# Patient Record
Sex: Male | Born: 1945 | Race: Asian
Health system: Southern US, Academic
[De-identification: ages and names within clinical notes are randomized; demographics above are authoritative.]

## PROBLEM LIST (undated history)

## (undated) ENCOUNTER — Encounter

## (undated) DIAGNOSIS — J189 Pneumonia, unspecified organism: Secondary | ICD-10-CM

## (undated) DIAGNOSIS — D649 Anemia, unspecified: Secondary | ICD-10-CM

## (undated) DIAGNOSIS — J9809 Other diseases of bronchus, not elsewhere classified: Secondary | ICD-10-CM

## (undated) DIAGNOSIS — J42 Unspecified chronic bronchitis: Secondary | ICD-10-CM

## (undated) DIAGNOSIS — E119 Type 2 diabetes mellitus without complications: Secondary | ICD-10-CM

## (undated) DIAGNOSIS — E7800 Pure hypercholesterolemia, unspecified: Secondary | ICD-10-CM

## (undated) DIAGNOSIS — C801 Malignant (primary) neoplasm, unspecified: Secondary | ICD-10-CM

## (undated) DIAGNOSIS — K219 Gastro-esophageal reflux disease without esophagitis: Secondary | ICD-10-CM

## (undated) DIAGNOSIS — K429 Umbilical hernia without obstruction or gangrene: Secondary | ICD-10-CM

## (undated) DIAGNOSIS — M1990 Unspecified osteoarthritis, unspecified site: Secondary | ICD-10-CM

## (undated) DIAGNOSIS — Z87442 Personal history of urinary calculi: Secondary | ICD-10-CM

## (undated) DIAGNOSIS — I10 Essential (primary) hypertension: Secondary | ICD-10-CM

## (undated) DIAGNOSIS — L570 Actinic keratosis: Secondary | ICD-10-CM

## (undated) DIAGNOSIS — G629 Polyneuropathy, unspecified: Secondary | ICD-10-CM

## (undated) HISTORY — PX: JOINT REPLACEMENT: SHX530

## (undated) HISTORY — PX: CARPAL TUNNEL RELEASE: SHX101

## (undated) HISTORY — PX: HERNIA REPAIR: SHX51

## (undated) HISTORY — PX: KNEE ARTHROSCOPY: SUR90

## (undated) HISTORY — PX: FRACTURE SURGERY: SHX138

## (undated) HISTORY — DX: Actinic keratosis: L57.0

## (undated) HISTORY — PX: TONSILLECTOMY: SUR1361

## (undated) HISTORY — PX: EYE SURGERY: SHX253

---

## 2009-09-28 DEATH — deceased

## 2010-09-29 DEATH — deceased

## 2013-03-04 ENCOUNTER — Ambulatory Visit: Payer: Self-pay | Admitting: Orthopedic Surgery

## 2013-04-20 ENCOUNTER — Ambulatory Visit: Payer: Self-pay | Admitting: Orthopedic Surgery

## 2013-04-20 LAB — BASIC METABOLIC PANEL
ANION GAP: 4 — AB (ref 7–16)
BUN: 19 mg/dL — AB (ref 7–18)
CALCIUM: 9.2 mg/dL (ref 8.5–10.1)
CHLORIDE: 102 mmol/L (ref 98–107)
CREATININE: 0.86 mg/dL (ref 0.60–1.30)
Co2: 29 mmol/L (ref 21–32)
EGFR (African American): >60
EGFR (Non-African Amer.): >60
Glucose: 113 mg/dL — ABNORMAL HIGH (ref 65–99)
OSMOLALITY: 273 (ref 275–301)
Potassium: 4 mmol/L (ref 3.5–5.1)
SODIUM: 135 mmol/L — AB (ref 136–145)

## 2013-04-20 LAB — APTT: ACTIVATED PTT: 26.7 s (ref 23.6–35.9)

## 2013-04-20 LAB — CBC
HCT: 44.3 % (ref 40.0–52.0)
HGB: 14.3 g/dL (ref 13.0–18.0)
MCH: 29.9 pg (ref 26.0–34.0)
MCHC: 32.3 g/dL (ref 32.0–36.0)
MCV: 92 fL (ref 80–100)
Platelet: 280 10*3/uL (ref 150–440)
RBC: 4.8 10*6/uL (ref 4.40–5.90)
RDW: 14 % (ref 11.5–14.5)
WBC: 8.7 10*3/uL (ref 3.8–10.6)

## 2013-04-20 LAB — URINALYSIS, COMPLETE
BACTERIA: NONE SEEN
Bilirubin,UR: NEGATIVE
Blood: NEGATIVE
Glucose,UR: NEGATIVE mg/dL (ref 0–75)
Ketone: NEGATIVE
LEUKOCYTE ESTERASE: NEGATIVE
Nitrite: NEGATIVE
PH: 5 (ref 4.5–8.0)
Protein: NEGATIVE
SPECIFIC GRAVITY: 1.023 (ref 1.003–1.030)
SQUAMOUS EPITHELIAL: NONE SEEN

## 2013-04-20 LAB — PROTIME-INR
INR: 0.9
Prothrombin Time: 12.4 secs (ref 11.5–14.7)

## 2013-04-20 LAB — SEDIMENTATION RATE: Erythrocyte Sed Rate: 5 mm/hr (ref 0–20)

## 2013-04-25 ENCOUNTER — Ambulatory Visit: Payer: Self-pay | Admitting: Orthopedic Surgery

## 2013-04-25 LAB — MRSA PCR SCREENING

## 2013-05-03 ENCOUNTER — Inpatient Hospital Stay: Payer: Self-pay | Admitting: Orthopedic Surgery

## 2013-05-04 LAB — BASIC METABOLIC PANEL
Anion Gap: 6 — ABNORMAL LOW (ref 7–16)
BUN: 17 mg/dL (ref 7–18)
CALCIUM: 7.9 mg/dL — AB (ref 8.5–10.1)
Chloride: 101 mmol/L (ref 98–107)
Co2: 25 mmol/L (ref 21–32)
Creatinine: 0.86 mg/dL (ref 0.60–1.30)
EGFR (African American): >60
GLUCOSE: 133 mg/dL — AB (ref 65–99)
OSMOLALITY: 268 (ref 275–301)
POTASSIUM: 4 mmol/L (ref 3.5–5.1)
SODIUM: 132 mmol/L — AB (ref 136–145)

## 2013-05-04 LAB — PLATELET COUNT: Platelet: 163 10*3/uL (ref 150–440)

## 2013-05-04 LAB — HEMOGLOBIN: HGB: 12.3 g/dL — ABNORMAL LOW (ref 13.0–18.0)

## 2013-05-06 LAB — PATHOLOGY REPORT

## 2014-03-14 DIAGNOSIS — E119 Type 2 diabetes mellitus without complications: Secondary | ICD-10-CM | POA: Insufficient documentation

## 2014-03-14 DIAGNOSIS — I10 Essential (primary) hypertension: Secondary | ICD-10-CM | POA: Insufficient documentation

## 2014-07-22 NOTE — Op Note (Signed)
PATIENT NAME:  Todd Farley, Todd Farley MR#:  [id number] DATE OF BIRTH:  [birth year]  DATE OF PROCEDURE:  05/03/2013  PREOPERATIVE DIAGNOSIS: Severe right knee osteoarthritis.   POSTOPERATIVE DIAGNOSIS: Severe right knee osteoarthritis.   PROCEDURE: Right total knee replacement.   SURGEON: Hessie Knows, M.D.   ASSISTANT: Rachelle Hora, PA-C  ANESTHESIA: Spinal.   DESCRIPTION OF PROCEDURE: The patient was brought to the operating room and after adequate spinal anesthesia was obtained the right leg was prepped and draped in the usual sterile fashion. After patient identification and timeout procedures were completed, an anterior skin incision was made incorporating an old knee scar that went distal and medial, further than what we normally use, but for wound healing it was felt this was appropriate. Incision was extended proximally proximal to the patella. Thick flap was created and the anterior capsule then exposed. A medial parapatellar arthrotomy was created and inspection revealed severe tricompartmental osteoarthritis with extensive spurring in the notch and extensive wear of all compartments. There was no normal cartilage visible. The medial meniscus appeared to be deficient as was the ACL. The proximal tibia was exposed to allow for application of the Medacta cutting block. After checking alignment the proximal tibia cut was carried out. With the proximal tibia cut completed, the attention was turned to the distal femur where it was exposed and the Medacta cutting block applied, distal cuts carried out, and the femur sized to a 6+. Anterior, posterior, and chamfer cuts were made. Following this, the posterior osteophytes were removed off the medial and lateral condyles. Residual posterior tibia bone was removed along with a small amount of lateral meniscus, again with most of the meniscus having been removed with prior surgeries. Trial components were placed with the 5 tibia giving excellent coverage,  rotation based off the initial tibial block, proximal drill hole made followed by keel punch. The 6 femur was applied and drill holes made. A 13 insert gave good stability and full extension, but a little bit of laxity in full extension. It was chosen for implant insertion when cement fixation was performed, but not as the final component. The trochlear notch cut was then created and then the patella was cut using the patellar cutting guide and sized to a size 3. At this point, the tourniquet was raised. It had been left down during the procedure. The bony surfaces were thoroughly irrigated and dried. Prior to raising the tourniquet, local anesthetic was infiltrated with Exparel diluted with saline as well as a combination of Toradol, morphine, and Sensorcaine with epinephrine. These were injected throughout the capsule. With the bony surfaces thoroughly irrigated and dried, the final components were cemented into place with first the tibia tray followed by the femoral component, 13 mm trial insert placed in between, and the knee held in extension with excess cement removed. The patellar button was clamped into place and after the cement had set excess cement was removed and a 14 mm trial inserted. This gave excellent stability along with full extension. It was chosen as the final component. The 14 mm final component was placed, locked into place, and set screw inserted. The patella tracked well with no touch technique. The tourniquet was let down, and the arthrotomy repaired using a heavy quill suture, 2-0 quill subcutaneously followed by skin staples. Xeroform, 4 x 4's, Webril, and Ace wrap were applied along with a Polar Care. The patient was sent to the recovery room in stable condition.   ESTIMATED BLOOD LOSS: 50 mL.  TOURNIQUET  TIME: 24 minutes at 300 mmHg.  IMPLANTS: Masury Sphere system, 6+ right sphere femur, 3 patella, right tibia baseplate with a 5 right 14 mm flex insert.   SPECIMENS: Cut  ends of bone.   COMPLICATIONS: None.   CONDITION: To recovery room stable. ____________________________ Laurene Footman, MD mjm:sb D: 05/03/2013 10:52:52 ET T: 05/03/2013 12:07:32 ET JOB#: [id number]  cc: Laurene Footman, MD, <Dictator> Laurene Footman MD ELECTRONICALLY SIGNED 05/03/2013 13:20

## 2014-07-22 NOTE — Discharge Summary (Signed)
PATIENT NAME:  Todd Farley MR#:  [id number] DATE OF BIRTH:  30-Dec-1945  DATE OF ADMISSION:  05/03/2013 DATE OF DISCHARGE:    ADMITTING DIAGNOSIS: Severe right knee osteoarthritis.   DISCHARGE DIAGNOSIS: Severe right knee osteoarthritis.   PROCEDURE: Right total knee replacement.   SURGEON: Laurene Footman, M.D.   ASSISTANT: Rachelle Hora, PA-C.   ANESTHESIA: Spinal.   ESTIMATED BLOOD LOSS: 50 mL.  TOURNIQUET TIME: 24 minutes at 300 mmHg.  IMPLANTS: Medacta GMK sphere system, 6+ right sphere femur, 3 patella, right tibia base plate with a 5 right 14 mm Flex insert.   SPECIMEN: Cut ends of bone.   COMPLICATIONS: None.   CONDITION: To recovery room stable.   HISTORY: The patient had recently been scheduled for a total knee on 05/07/2013. He had an upper respiratory infection, completed a Z-Pak, and was feeling much better. He has been having unrelenting knee pain for many years. He has moved here from [place] and had extensive nonoperative treatment including brace, which he continues to wear, corticosteroid and Synvisc injections. He has had extensive reconstructive surgery when he was young with extraarticular reconstruction for ACL tear.   PHYSICAL EXAMINATION: LUNGS: Clear to auscultation.  HEART: Regular rate and rhythm.  HEENT: Normal.  MUSCULOSKELETAL: On examination, he has 10 to 105 degrees range of motion. He has some laxity to varus and valgus as well as Lachman test. He has anterior scares over the knee. The second scar is medial and extends far medial. He also has 1 lateral to midline. Previously, I thought that that would be our incision, but I talked with him today that he would probably need to use more lateral incision extending quite proximally and distally to get a large subcutaneous flap to perform knee replacement. There is risk of skin problems between the scars and he understands that. Distally he has no significant edema. He has palpable pulses, dorsalis,  pedis, and trace posterior tibialis. He is able to flex and extend the toes. 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   HOSPITAL COURSE: The patient was admitted to the hospital on 05/03/2013. He had surgery that same day and was brought to the orthopedic floor from the PAC-U in stable condition. On postop day 1, the patient's vital signs and labs were stable. Pain was controlled. He was having a significant amount of nausea and vomiting so we discontinued his oxycodone and started him on Ultram, which seemed to provide adequate pain relief. On postop day 2 and 3, the patient progressed very well with physical therapy. He was able to ambulate 245 feet. He had range of motion of -4 to 104 degrees. He was able to ambulate 245 feet and he performed stairs with no difficulty. The patient's pain was well controlled with Ultram, vital signs were stable, and he was ready for discharge on postop day 3 to home with home health PT.  CONDITION AT DISCHARGE: Stable.   DISCHARGE INSTRUCTIONS: The patient may gradually increase weight-bearing on the effected extremity, and he is to elevate the effected foot or leg on 1 or 2 pillows with the foot higher than the knee, thigh-high TED hose on both legs and remove at bedtime and replace when arising the next morning. Elevate the heels off the bed. Use incentive every hour while awake. He may resume a regular diet as tolerated. Continue using  Polar Care unit maintaining a temp between 40 and 50 degrees. Do not get the dressing or bandage wet or dirty. Call [facility] ortho if the dressing gets water under it. Leave the dressing on. Call [facility] ortho if any of the following occur: Bright red bleeding from the incision or wound, fever above 101.5 degrees, redness, swelling, or drainage at the incision site. Call  [facility] orthopedics if you experience any increased leg pain, numbness or weakness in your legs or bowel or bladder symptoms. Home physical therapy has been arranged for continuation of his rehab program. He needs to call [facility] orthopedics if a therapist has not contacted him within 48 hours of his return home. Call [facility] orthopedics to confirm appointment in 2 weeks.   DISCHARGE MEDICATIONS: Metformin 500 mg 1 tablet orally 2 times a day, omeprazole 20 mg oral delayed-release capsule 1 tablet orally once a day, lisinopril 2.5 mg oral tablet 1 tab orally once a day, simvastatin 20 mg 1 tablet orally once a day, Claritin 10 mg 1 tablet orally once a day, Tylenol 500 mg 1 tab orally every 4 hours as needed for pain or temp greater than 100.4, tramadol 50 mg oral tablet 1 tablet orally every 4 to 6 hours as needed for pain, magnesium hydroxide 8% oral suspension 30 mL orally 2 times a day as needed for constipation, Xarelto 10 mg oral tablet 1 tablet orally once a day in the morning x 9 days, zolpidem 5 mg oral tablet 1 tablet orally once a day at bedtime, bisacodyl 10 mg rectal suppository 1 suppository rectally once as needed for constipation, docusate/senna 50 mg/8.6 mg oral tablet 1 tablet orally 2 times a day, Flexeril 5 mg oral tablet 1 tablet orally every 6 hours as needed for muscle spasms, multivitamin 1 tablet orally once a day.   ____________________________ T. Rachelle Hora, PA-C tcg:sb D: 05/06/2013 08:09:32 ET T: 05/06/2013 08:46:16 ET JOB#: [id number]  cc: T. Rachelle Hora, PA-C, <Dictator> Duanne Guess [place] ELECTRONICALLY SIGNED 05/12/2013 11:23

## 2015-04-13 DIAGNOSIS — K219 Gastro-esophageal reflux disease without esophagitis: Secondary | ICD-10-CM | POA: Insufficient documentation

## 2015-06-22 ENCOUNTER — Encounter: Payer: Self-pay | Admitting: *Deleted

## 2015-06-25 ENCOUNTER — Encounter: Payer: Self-pay | Admitting: *Deleted

## 2015-06-25 ENCOUNTER — Ambulatory Visit: Payer: Medicare Other | Admitting: Anesthesiology

## 2015-06-25 ENCOUNTER — Ambulatory Visit
Admission: RE | Admit: 2015-06-25 | Discharge: 2015-06-25 | Disposition: A | Discharge status: Home / Self Care | Payer: Medicare Other | Source: Ambulatory Visit | Attending: Unknown Physician Specialty | Admitting: Unknown Physician Specialty

## 2015-06-25 ENCOUNTER — Encounter
Admission: RE | Disposition: A | Discharge status: Home / Self Care | Payer: Self-pay | Source: Ambulatory Visit | Attending: Unknown Physician Specialty

## 2015-06-25 DIAGNOSIS — Z87891 Personal history of nicotine dependence: Secondary | ICD-10-CM | POA: Insufficient documentation

## 2015-06-25 DIAGNOSIS — Z7984 Long term (current) use of oral hypoglycemic drugs: Secondary | ICD-10-CM | POA: Insufficient documentation

## 2015-06-25 DIAGNOSIS — Z79899 Other long term (current) drug therapy: Secondary | ICD-10-CM | POA: Diagnosis not present

## 2015-06-25 DIAGNOSIS — Z9889 Other specified postprocedural states: Secondary | ICD-10-CM | POA: Diagnosis not present

## 2015-06-25 DIAGNOSIS — E119 Type 2 diabetes mellitus without complications: Secondary | ICD-10-CM | POA: Insufficient documentation

## 2015-06-25 DIAGNOSIS — K5730 Diverticulosis of large intestine without perforation or abscess without bleeding: Secondary | ICD-10-CM | POA: Diagnosis not present

## 2015-06-25 DIAGNOSIS — K640 First degree hemorrhoids: Secondary | ICD-10-CM | POA: Diagnosis not present

## 2015-06-25 DIAGNOSIS — I10 Essential (primary) hypertension: Secondary | ICD-10-CM | POA: Insufficient documentation

## 2015-06-25 DIAGNOSIS — K219 Gastro-esophageal reflux disease without esophagitis: Secondary | ICD-10-CM | POA: Insufficient documentation

## 2015-06-25 DIAGNOSIS — K317 Polyp of stomach and duodenum: Secondary | ICD-10-CM | POA: Insufficient documentation

## 2015-06-25 DIAGNOSIS — Z1211 Encounter for screening for malignant neoplasm of colon: Secondary | ICD-10-CM | POA: Insufficient documentation

## 2015-06-25 DIAGNOSIS — K210 Gastro-esophageal reflux disease with esophagitis: Secondary | ICD-10-CM | POA: Insufficient documentation

## 2015-06-25 HISTORY — PX: ESOPHAGOGASTRODUODENOSCOPY (EGD) WITH PROPOFOL: SHX5813

## 2015-06-25 HISTORY — DX: Gastro-esophageal reflux disease without esophagitis: K21.9

## 2015-06-25 HISTORY — DX: Essential (primary) hypertension: I10

## 2015-06-25 HISTORY — PX: COLONOSCOPY WITH PROPOFOL: SHX5780

## 2015-06-25 HISTORY — DX: Type 2 diabetes mellitus without complications: E11.9

## 2015-06-25 LAB — GLUCOSE, CAPILLARY: GLUCOSE-CAPILLARY: 129 mg/dL — AB (ref 65–99)

## 2015-06-25 SURGERY — COLONOSCOPY WITH PROPOFOL
Anesthesia: General

## 2015-06-25 MED ORDER — PROPOFOL 10 MG/ML IV BOLUS
INTRAVENOUS | Status: DC | PRN
  Administered 2015-06-25: 80 mg via INTRAVENOUS

## 2015-06-25 MED ORDER — PIPERACILLIN-TAZOBACTAM 3.375 G IVPB
3.3750 g | Freq: Once | INTRAVENOUS | Status: AC
  Administered 2015-06-25: 3.375 g via INTRAVENOUS
  Filled 2015-06-25: qty 50

## 2015-06-25 MED ORDER — LACTATED RINGERS IV SOLN
INTRAVENOUS | Status: DC | PRN
  Administered 2015-06-25: 08:00:00 via INTRAVENOUS

## 2015-06-25 MED ORDER — SODIUM CHLORIDE 0.9 % IV SOLN
INTRAVENOUS | Status: DC
  Administered 2015-06-25: 07:00:00 via INTRAVENOUS

## 2015-06-25 MED ORDER — PROPOFOL 500 MG/50ML IV EMUL
INTRAVENOUS | Status: DC | PRN
  Administered 2015-06-25: 140 ug/kg/min via INTRAVENOUS

## 2015-06-25 MED ORDER — SODIUM CHLORIDE 0.9 % IV SOLN: INTRAVENOUS | Status: DC

## 2015-06-25 NOTE — Anesthesia Preprocedure Evaluation (Signed)
Anesthesia Evaluation  Patient identified by MRN, date of birth, ID band Patient awake    Reviewed: Allergy & Precautions, NPO status , Patient's Chart, lab work & pertinent test results  History of Anesthesia Complications Negative for: history of anesthetic complications  Airway Mallampati: II       Dental   Pulmonary neg pulmonary ROS, former smoker,           Cardiovascular hypertension, Pt. on medications      Neuro/Psych negative neurological ROS     GI/Hepatic Neg liver ROS, GERD  Medicated,  Endo/Other  diabetes, Type 2, Oral Hypoglycemic Agents  Renal/GU negative Renal ROS     Musculoskeletal   Abdominal   Peds  Hematology negative hematology ROS (+)   Anesthesia Other Findings   Reproductive/Obstetrics                             Anesthesia Physical Anesthesia Plan  ASA: III  Anesthesia Plan: General   Post-op Pain Management:    Induction: Intravenous  Airway Management Planned: Nasal Cannula  Additional Equipment:   Intra-op Plan:   Post-operative Plan:   Informed Consent: I have reviewed the patients History and Physical, chart, labs and discussed the procedure including the risks, benefits and alternatives for the proposed anesthesia with the patient or authorized representative who has indicated his/her understanding and acceptance.     Plan Discussed with:   Anesthesia Plan Comments:         Anesthesia Quick Evaluation

## 2015-06-25 NOTE — Transfer of Care (Signed)
Immediate Anesthesia Transfer of Care Note  Patient: Todd Farley  Procedure(s) Performed: Procedure(s): COLONOSCOPY WITH PROPOFOL (N/A) ESOPHAGOGASTRODUODENOSCOPY (EGD) WITH PROPOFOL (N/A)  Patient Location: PACU and Endoscopy Unit  Anesthesia Type:General  Level of Consciousness: awake, alert  and oriented  Airway & Oxygen Therapy: Patient Spontanous Breathing and Patient connected to nasal cannula oxygen  Post-op Assessment: Report given to RN and Post -op Vital signs reviewed and stable  Post vital signs: Reviewed and stable  Last Vitals:  Filed Vitals:   06/25/15 0656  BP: 116/79  Pulse: 77  Temp: 36.4 C  Resp: 16    Complications: No apparent anesthesia complications

## 2015-06-25 NOTE — Anesthesia Postprocedure Evaluation (Signed)
Anesthesia Post Note  Patient: Todd Farley  Procedure(s) Performed: Procedure(s) (LRB): COLONOSCOPY WITH PROPOFOL (N/A) ESOPHAGOGASTRODUODENOSCOPY (EGD) WITH PROPOFOL (N/A)  Patient location during evaluation: Endoscopy Anesthesia Type: General Level of consciousness: awake and alert Pain management: pain level controlled Vital Signs Assessment: post-procedure vital signs reviewed and stable Respiratory status: spontaneous breathing and respiratory function stable Cardiovascular status: stable Anesthetic complications: no    Last Vitals:  Filed Vitals:   06/25/15 0828 06/25/15 0838  BP: 116/72 108/71  Pulse: 73 70  Temp:    Resp: 16 15    Last Pain:  Filed Vitals:   06/25/15 0843  PainSc: Asleep                 Claudia Greenley K

## 2015-06-25 NOTE — H&P (Signed)
Primary Care Physician:  Madelyn Brunner, MD Primary Gastroenterologist:  Dr. Vira Agar  Pre-Procedure History & Physical: HPI:  Todd Farley is a 70 y.o. male is here for an colonoscopy and EGD.Marland Kitchen   Past Medical History  Diagnosis Date  . Diabetes mellitus without complication ([place])   . GERD (gastroesophageal reflux disease)   . Hypertension     Past Surgical History  Procedure Laterality Date  . Hernia repair    . Joint replacement    . Eye surgery    . Carpal tunnel release Right     Prior to Admission medications   Medication Sig Start Date End Date Taking? Authorizing Provider  augmented betamethasone dipropionate (DIPROLENE-AF) 0.05 % ointment Apply topically 2 (two) times daily.   Yes Historical Provider, MD  benzonatate (TESSALON) 200 MG capsule Take 200 mg by mouth 3 (three) times daily as needed for cough.   Yes Historical Provider, MD  cefUROXime (CEFTIN) 500 MG tablet Take 500 mg by mouth 2 (two) times daily with a meal.   Yes Historical Provider, MD  fluocinonide gel (LIDEX) [id number] % Apply 1 application topically daily.   Yes Historical Provider, MD  ibuprofen (ADVIL,MOTRIN) 200 MG tablet Take 200 mg by mouth every 6 (six) hours as needed (take 600 mg by mouth every 6 hrs prn).   Yes Historical Provider, MD  loratadine (CLARITIN) 10 MG tablet Take 10 mg by mouth daily.   Yes Historical Provider, MD  losartan (COZAAR) 50 MG tablet Take 50 mg by mouth daily.   Yes Historical Provider, MD  metFORMIN (GLUCOPHAGE) 500 MG tablet Take by mouth 2 (two) times daily with a meal.   Yes Historical Provider, MD  Multiple Vitamin (MULTIVITAMIN) tablet Take 1 tablet by mouth daily.   Yes Historical Provider, MD  omeprazole (PRILOSEC) 40 MG capsule Take 40 mg by mouth daily.   Yes Historical Provider, MD  simvastatin (ZOCOR) 20 MG tablet Take 20 mg by mouth at bedtime.   Yes Historical Provider, MD    Allergies as of 06/13/2015  . (Not on File)    History reviewed. No  pertinent family history.  Social History   Social History  . Marital Status: Married    Spouse Name: N/A  . Number of Children: N/A  . Years of Education: N/A   Occupational History  . Not on file.   Social History Main Topics  . Smoking status: Former Research scientist (life sciences)  . Smokeless tobacco: Never Used  . Alcohol Use: No  . Drug Use: No  . Sexual Activity: Not on file   Other Topics Concern  . Not on file   Social History Narrative    Review of Systems: See HPI, otherwise negative ROS  Physical Exam: BP 116/79 mmHg  Pulse 77  Temp(Src) 97.6 F (36.4 C) (Tympanic)  Resp 16  Ht 6\' 1"  (1.854 m)  Wt 107.049 kg (236 lb)  BMI 31.14 kg/m2  SpO2 98% General:   Alert,  pleasant and cooperative in NAD Head:  Normocephalic and atraumatic. Neck:  Supple; no masses or thyromegaly. Lungs:  Clear throughout to auscultation.    Heart:  Regular rate and rhythm. Abdomen:  Soft, nontender and nondistended. Normal bowel sounds, without guarding, and without rebound.   Neurologic:  Alert and  oriented x4;  grossly normal neurologically.  Impression/Plan: Todd Farley is here for an endoscopy and colonoscopy to be performed for screening colonoscopy and GERD  Risks, benefits, limitations, and alternatives regarding  endoscopy  and colonoscopy have been reviewed with the patient.  Questions have been answered.  All parties agreeable.   Gaylyn Cheers, MD  06/25/2015, 7:27 AM

## 2015-06-25 NOTE — Op Note (Signed)
[facility] Gastroenterology Patient Name: Todd Farley Procedure Date: 06/25/2015 7:30 AM MRN: [id number] Account #: [id number] Date of Birth: April 04, 1945 Admit Type: Outpatient Age: 70 Room: [facility] ENDO ROOM 1 Gender: Male Note Status: Finalized Procedure:            Upper GI endoscopy Indications:          Heartburn Providers:            Manya Silvas, MD Referring MD:         Hewitt Blade. Sarina Ser, MD (Referring MD) Medicines:            Propofol per Anesthesia Complications:        No immediate complications. Procedure:            Pre-Anesthesia Assessment:                       - After reviewing the risks and benefits, the patient                        was deemed in satisfactory condition to undergo the                        procedure.                       After obtaining informed consent, the endoscope was                        passed under direct vision. Throughout the procedure,                        the patient's blood pressure, pulse, and oxygen                        saturations were monitored continuously. The Endoscope                        was introduced through the mouth, and advanced to the                        second part of duodenum. The upper GI endoscopy was                        accomplished without difficulty. The patient tolerated                        the procedure well. Findings:      LA Grade A (one or more mucosal breaks less than 5 mm, not extending       between tops of 2 mucosal folds) esophagitis with no bleeding was found       45 cm from the incisors. Biopsies were taken with a cold forceps for       histology.      Multiple medium pedunculated and sessile polyps with no bleeding and no       stigmata of recent bleeding were found in the gastric body and in the       gastric antrum.      The examined duodenum was normal. Impression:           - LA Grade A reflux esophagitis. Rule out Barrett's   esophagus.  Biopsied.                       - Multiple gastric polyps.                       - Normal examined duodenum. Recommendation:       - Await pathology results.                       - Perform a colonoscopy today. Manya Silvas, MD 06/25/2015 7:47:25 AM This report has been signed electronically. Number of Addenda: 0 Note Initiated On: 06/25/2015 7:30 AM      [facility]

## 2015-06-25 NOTE — Op Note (Signed)
[facility] Gastroenterology Patient Name: Todd Farley Procedure Date: 06/25/2015 7:30 AM MRN: [id number] Account #: [id number] Date of Birth: 1946-02-10 Admit Type: Outpatient Age: 70 Room: [facility] ENDO ROOM 1 Gender: Male Note Status: Finalized Procedure:            Colonoscopy Indications:          Screening for colorectal malignant neoplasm Providers:            Manya Silvas, MD Referring MD:         Hewitt Blade. Sarina Ser, MD (Referring MD) Medicines:            Propofol per Anesthesia Complications:        No immediate complications. Procedure:            Pre-Anesthesia Assessment:                       - After reviewing the risks and benefits, the patient                        was deemed in satisfactory condition to undergo the                        procedure.                       After obtaining informed consent, the colonoscope was                        passed under direct vision. Throughout the procedure,                        the patient's blood pressure, pulse, and oxygen                        saturations were monitored continuously. The                        Colonoscope was introduced through the anus and                        advanced to the the cecum, identified by appendiceal                        orifice and ileocecal valve. The colonoscopy was                        performed without difficulty. The patient tolerated the                        procedure well. The quality of the bowel preparation                        was good. Findings:      Multiple small and large-mouthed diverticula were found in the sigmoid       colon, descending colon and transverse colon.      Internal hemorrhoids were found during endoscopy. The hemorrhoids were       small and Grade I (internal hemorrhoids that do not prolapse).      The exam was otherwise without abnormality. Impression:           -  Diverticulosis in the sigmoid colon, in the           descending colon and in the transverse colon.                       - Internal hemorrhoids.                       - The examination was otherwise normal.                       - No specimens collected. Recommendation:       - The findings and recommendations were discussed with                        the patient's family. Manya Silvas, MD 06/25/2015 8:06:12 AM This report has been signed electronically. Number of Addenda: 0 Note Initiated On: 06/25/2015 7:30 AM Scope Withdrawal Time: 0 hours 8 minutes 58 seconds  Total Procedure Duration: 0 hours 14 minutes 27 seconds       [facility]

## 2015-06-26 ENCOUNTER — Encounter: Payer: Self-pay | Admitting: Unknown Physician Specialty

## 2015-06-26 LAB — SURGICAL PATHOLOGY

## 2017-04-11 ENCOUNTER — Inpatient Hospital Stay
Admit: 2017-04-11 | Discharge: 2017-04-15 | Disposition: A | Discharge status: Skilled Nursing Facility | Admitting: Neurology

## 2017-04-11 ENCOUNTER — Emergency Department: Admit: 2017-04-11 | Discharge: 2017-04-15

## 2017-04-11 DIAGNOSIS — I10 Essential (primary) hypertension: Secondary | ICD-10-CM

## 2017-04-11 DIAGNOSIS — I6521 Occlusion and stenosis of right carotid artery: Secondary | ICD-10-CM

## 2017-04-11 DIAGNOSIS — R531 Weakness: Secondary | ICD-10-CM

## 2017-04-11 DIAGNOSIS — E512 Wernicke's encephalopathy: Secondary | ICD-10-CM

## 2017-04-11 DIAGNOSIS — E785 Hyperlipidemia, unspecified: Secondary | ICD-10-CM

## 2017-04-11 DIAGNOSIS — I2510 Atherosclerotic heart disease of native coronary artery without angina pectoris: Secondary | ICD-10-CM

## 2017-04-11 DIAGNOSIS — R269 Unspecified abnormalities of gait and mobility: Secondary | ICD-10-CM

## 2017-04-11 DIAGNOSIS — I4891 Unspecified atrial fibrillation: Secondary | ICD-10-CM

## 2017-04-11 DIAGNOSIS — F1021 Alcohol dependence, in remission: Secondary | ICD-10-CM

## 2017-04-11 DIAGNOSIS — F0151 Vascular dementia with behavioral disturbance: Principal | ICD-10-CM

## 2017-04-11 DIAGNOSIS — R339 Retention of urine, unspecified: Secondary | ICD-10-CM

## 2017-04-11 DIAGNOSIS — I110 Hypertensive heart disease with heart failure: Secondary | ICD-10-CM

## 2017-04-11 DIAGNOSIS — Z87891 Personal history of nicotine dependence: Secondary | ICD-10-CM

## 2017-04-11 DIAGNOSIS — Z955 Presence of coronary angioplasty implant and graft: Secondary | ICD-10-CM

## 2017-04-11 DIAGNOSIS — I6350 Cerebral infarction due to unspecified occlusion or stenosis of unspecified cerebral artery: Secondary | ICD-10-CM

## 2017-04-11 DIAGNOSIS — R4182 Altered mental status, unspecified: Principal | ICD-10-CM

## 2017-04-11 DIAGNOSIS — I252 Old myocardial infarction: Secondary | ICD-10-CM

## 2017-04-11 DIAGNOSIS — I5042 Chronic combined systolic (congestive) and diastolic (congestive) heart failure: Secondary | ICD-10-CM

## 2017-04-11 DIAGNOSIS — I159 Secondary hypertension, unspecified: Secondary | ICD-10-CM

## 2017-04-11 DIAGNOSIS — Z8673 Personal history of transient ischemic attack (TIA), and cerebral infarction without residual deficits: Secondary | ICD-10-CM

## 2017-04-11 DIAGNOSIS — Z8659 Personal history of other mental and behavioral disorders: Secondary | ICD-10-CM

## 2017-04-11 DIAGNOSIS — I739 Peripheral vascular disease, unspecified: Secondary | ICD-10-CM

## 2017-04-11 DIAGNOSIS — W19XXXA Unspecified fall, initial encounter: Secondary | ICD-10-CM

## 2017-04-11 DIAGNOSIS — F0390 Unspecified dementia without behavioral disturbance: Principal | ICD-10-CM

## 2017-04-11 DIAGNOSIS — F1027 Alcohol dependence with alcohol-induced persisting dementia: Secondary | ICD-10-CM

## 2017-04-11 DIAGNOSIS — I482 Chronic atrial fibrillation, unspecified: Secondary | ICD-10-CM

## 2017-04-11 MED ORDER — ATORVASTATIN CALCIUM 80 MG PO TABS
80 mg | Freq: Every evening | ORAL
Start: 2017-04-11 — End: ?

## 2017-04-11 MED ORDER — LIDOCAINE 5 % EX PTCH
1 | MEDICATED_PATCH | TRANSDERMAL
Start: 2017-04-11 — End: ?

## 2017-04-11 MED ORDER — METOPROLOL SUCCINATE ER 25 MG PO TB24
25 mg | Freq: Every day | ORAL
Start: 2017-04-11 — End: ?

## 2017-04-11 MED ORDER — NICOTINE PATCH REMOVAL
1 | MEDICATED_PATCH | Freq: Every day | TRANSDERMAL | Status: DC
Start: 2017-04-11 — End: 2017-04-16

## 2017-04-11 MED ORDER — ACETAMINOPHEN 325 MG PO TABS
650 mg | ORAL | Status: DC | PRN
Start: 2017-04-11 — End: 2017-04-16

## 2017-04-11 MED ORDER — IOHEXOL 350 MG/ML IV SOLN SH
100 mL | Freq: Once | INTRAVENOUS | Status: CP
Start: 2017-04-11 — End: ?

## 2017-04-11 MED ORDER — GLUCOSE 4 G PO CHEW JX
16 g | ORAL | Status: DC | PRN
Start: 2017-04-11 — End: 2017-04-16

## 2017-04-11 MED ORDER — ASPIRIN 325 MG PO TABS
325 mg | Freq: Once | ORAL | Status: CP
Start: 2017-04-11 — End: ?

## 2017-04-11 MED ORDER — HEPARIN SODIUM (PORCINE) 5000 UNIT/ML IJ SOLN
5000 [IU] | Freq: Two times a day (BID) | SUBCUTANEOUS | Status: DC
Start: 2017-04-11 — End: 2017-04-16

## 2017-04-11 MED ORDER — LISINOPRIL 40 MG PO TABS
40 mg | Freq: Two times a day (BID) | ORAL
Start: 2017-04-11 — End: ?

## 2017-04-11 MED ORDER — GABAPENTIN 100 MG PO CAPS
200 mg | Freq: Three times a day (TID) | ORAL
Start: 2017-04-11 — End: ?

## 2017-04-11 MED ORDER — TICAGRELOR 90 MG PO TABS
90 mg | Freq: Two times a day (BID) | ORAL
Start: 2017-04-11 — End: ?

## 2017-04-11 MED ORDER — ASPIRIN 81 MG PO TABS
81 mg | Freq: Every day | ORAL
Start: 2017-04-11 — End: ?

## 2017-04-11 MED ORDER — THIAMINE-FOLIC ACID INFUSION
Freq: Every day | INTRAVENOUS | Status: CP
Start: 2017-04-11 — End: ?

## 2017-04-11 MED ORDER — NICOTINE 21 MG/24HR TD PT24
1 | MEDICATED_PATCH | Freq: Every day | TRANSDERMAL | Status: DC
Start: 2017-04-11 — End: 2017-04-16

## 2017-04-11 MED ORDER — LORAZEPAM 2 MG/ML IJ SOLN
1 mg | INTRAVENOUS | Status: CP | PRN
Start: 2017-04-11 — End: ?

## 2017-04-11 MED ORDER — DEXTROSE 50 % IV SOLN
30 mL | INTRAVENOUS | Status: DC | PRN
Start: 2017-04-11 — End: 2017-04-16

## 2017-04-11 MED ORDER — OXYBUTYNIN CHLORIDE 5 MG PO TABS
5 mg | Freq: Three times a day (TID) | ORAL
Start: 2017-04-11 — End: ?

## 2017-04-11 MED ORDER — DOCUSATE SODIUM 100 MG PO CAPS
100 mg | Freq: Every day | ORAL | Status: DC
Start: 2017-04-11 — End: 2017-04-16

## 2017-04-11 MED ORDER — LABETALOL HCL 5 MG/ML IV SOLN SH
10 mg | INTRAVENOUS | Status: DC | PRN
Start: 2017-04-11 — End: 2017-04-16

## 2017-04-11 MED ORDER — NIFEDIPINE ER 30 MG PO TB24
30 mg | Freq: Every day | ORAL
Start: 2017-04-11 — End: ?

## 2017-04-11 MED ORDER — SODIUM CHLORIDE 0.9% FOR FLUSHES
20-180 mL | INTRAVENOUS | Status: CP | PRN
Start: 2017-04-11 — End: ?

## 2017-04-11 MED ORDER — TRAZODONE HCL 50 MG PO TABS
50 mg | Freq: Every evening | ORAL
Start: 2017-04-11 — End: ?

## 2017-04-11 MED ORDER — PERFLUTREN LIPID MICROSPHERE 6.52 MG/ML IV SUSP
.2-1.3 mL | INTRAVENOUS | Status: DC | PRN
Start: 2017-04-11 — End: 2017-04-16

## 2017-04-11 MED ORDER — CYANOCOBALAMIN 1000 MCG PO TABS
1000 ug | Freq: Every day | ORAL
Start: 2017-04-11 — End: ?

## 2017-04-11 MED ORDER — INFLUENZA VAC SPLIT HIGH-DOSE 0.5 ML IM SUSY
0.5 mL | Freq: Once | INTRAMUSCULAR | Status: CP
Start: 2017-04-11 — End: ?

## 2017-04-11 NOTE — ED Triage Notes
Pt presents to ED with complaint of AMS. Pt from a nursing home and has not slept in 3 days. Per EMS, nursing home states pt became combative around 1am this morning. Pt also having visual hallucinations at this time. Pt currently AOx1. Pt does not appear to be in any distress at this time.

## 2017-04-12 DIAGNOSIS — Z87891 Personal history of nicotine dependence: Secondary | ICD-10-CM

## 2017-04-12 DIAGNOSIS — Z8673 Personal history of transient ischemic attack (TIA), and cerebral infarction without residual deficits: Secondary | ICD-10-CM

## 2017-04-12 DIAGNOSIS — I252 Old myocardial infarction: Secondary | ICD-10-CM

## 2017-04-12 DIAGNOSIS — R269 Unspecified abnormalities of gait and mobility: Secondary | ICD-10-CM

## 2017-04-12 DIAGNOSIS — F0151 Vascular dementia with behavioral disturbance: Principal | ICD-10-CM

## 2017-04-12 DIAGNOSIS — I5042 Chronic combined systolic (congestive) and diastolic (congestive) heart failure: Secondary | ICD-10-CM

## 2017-04-12 DIAGNOSIS — I6521 Occlusion and stenosis of right carotid artery: Secondary | ICD-10-CM

## 2017-04-12 DIAGNOSIS — R339 Retention of urine, unspecified: Secondary | ICD-10-CM

## 2017-04-12 DIAGNOSIS — I110 Hypertensive heart disease with heart failure: Secondary | ICD-10-CM

## 2017-04-12 DIAGNOSIS — I739 Peripheral vascular disease, unspecified: Secondary | ICD-10-CM

## 2017-04-12 DIAGNOSIS — E785 Hyperlipidemia, unspecified: Secondary | ICD-10-CM

## 2017-04-12 DIAGNOSIS — E512 Wernicke's encephalopathy: Secondary | ICD-10-CM

## 2017-04-12 DIAGNOSIS — Z955 Presence of coronary angioplasty implant and graft: Secondary | ICD-10-CM

## 2017-04-12 DIAGNOSIS — I4891 Unspecified atrial fibrillation: Secondary | ICD-10-CM

## 2017-04-12 DIAGNOSIS — F1021 Alcohol dependence, in remission: Secondary | ICD-10-CM

## 2017-04-12 DIAGNOSIS — I2510 Atherosclerotic heart disease of native coronary artery without angina pectoris: Secondary | ICD-10-CM

## 2017-04-12 MED ORDER — SODIUM CHLORIDE 0.9% FOR FLUSHES
20-180 mL | INTRAVENOUS | Status: CP | PRN
Start: 2017-04-12 — End: ?

## 2017-04-12 MED ORDER — DIPHENHYDRAMINE HCL 50 MG/ML IJ SOLN
25 mg | Freq: Once | INTRAVENOUS | Status: CP
Start: 2017-04-12 — End: ?

## 2017-04-12 MED ORDER — QUETIAPINE FUMARATE 25 MG PO TABS
25 mg | Freq: Once | ORAL | Status: CP | PRN
Start: 2017-04-12 — End: ?

## 2017-04-12 MED ORDER — LISINOPRIL 40 MG PO TABS
40 mg | Freq: Two times a day (BID) | ORAL | Status: DC
Start: 2017-04-12 — End: 2017-04-16

## 2017-04-12 MED ORDER — METOPROLOL SUCCINATE ER 25 MG PO TB24
25 mg | Freq: Every day | ORAL | Status: DC
Start: 2017-04-12 — End: 2017-04-16

## 2017-04-12 MED ORDER — HALOPERIDOL LACTATE 5 MG/ML IJ SOLN
2 mg | INTRAVENOUS | Status: DC | PRN
Start: 2017-04-12 — End: 2017-04-12

## 2017-04-12 MED ORDER — TICAGRELOR 90 MG PO TABS
90 mg | Freq: Two times a day (BID) | ORAL | Status: DC
Start: 2017-04-12 — End: 2017-04-16

## 2017-04-12 MED ORDER — TRAZODONE HCL 50 MG PO TABS
50 mg | Freq: Every evening | ORAL | Status: DC
Start: 2017-04-12 — End: 2017-04-16

## 2017-04-12 MED ORDER — ATORVASTATIN CALCIUM 80 MG PO TABS
80 mg | Freq: Every evening | ORAL | Status: DC
Start: 2017-04-12 — End: 2017-04-16

## 2017-04-12 MED ORDER — GABAPENTIN 100 MG PO CAPS
200 mg | Freq: Three times a day (TID) | ORAL | Status: DC
Start: 2017-04-12 — End: 2017-04-16

## 2017-04-12 MED ORDER — ASPIRIN 81 MG PO CHEW
81 mg | Freq: Every day | ORAL | Status: DC
Start: 2017-04-12 — End: 2017-04-16

## 2017-04-12 MED ORDER — NIFEDIPINE ER OSMOTIC RELEASE 30 MG PO TB24
30 mg | Freq: Every day | ORAL | Status: DC
Start: 2017-04-12 — End: 2017-04-16

## 2017-04-12 MED ORDER — OXYBUTYNIN CHLORIDE 5 MG PO TABS
5 mg | Freq: Three times a day (TID) | ORAL | Status: DC
Start: 2017-04-12 — End: 2017-04-16

## 2017-04-12 MED ORDER — GADOTERATE MEGLUMINE 10 MMOL/20ML IV SOLN
17 mL | Freq: Once | INTRAVENOUS | Status: CP
Start: 2017-04-12 — End: ?

## 2017-04-12 MED ORDER — HALOPERIDOL LACTATE 5 MG/ML IJ SOLN
5 mg | Freq: Four times a day (QID) | INTRAVENOUS | Status: DC | PRN
Start: 2017-04-12 — End: 2017-04-16

## 2017-04-12 MED ORDER — LORAZEPAM 2 MG/ML IJ SOLN
1 mg | Freq: Once | INTRAVENOUS | Status: CP | PRN
Start: 2017-04-12 — End: ?

## 2017-04-12 MED ORDER — IOHEXOL 350 MG/ML IV SOLN SH
100 mL | Freq: Once | INTRAVENOUS | Status: CP
Start: 2017-04-12 — End: ?

## 2017-04-12 NOTE — H&P
V 1/2/3: sensation is intact on forehead, cheeks, and jaw region   VII: no facial droop; facial symmetry while smiling & wrinkling of forehead; tight lid closure   VIII: able to hear throughout the history taking process   IX & X: symmetric palatal elevation   XI: normal jaw and shoulder strength against resistance   XII: tongue is symmetrical & midline with no atrophy or fasciculation   Speech: normal   Motor:  normal bulk, normal tone; no drift   Strength:  5/5 in all muscle groups excepting: left arm 4+/5 strength proximal>distal.   Reflexes: 2+ and symmetric throughout excepting: none.    Sensory: intact to light touch in all 4 extremities excepting: none.   Coordination: finger-to-nose intact B/L; no signs of dysmetria   Mild tremors present thoughout with almost any movement.      Data Review:  Recent Labs      04/11/17   1444   WBC  7.11   HGB  11.2*   HCT  35.5*   MCV  89.9   NEUTROPCT  49.3       Recent Labs      04/11/17   1443   NA  140   K  3.9   CL  101   CO2  26   BUN  18   CREATININE  1.17   GLU  124*   CALCIUM  9.2   MG  2.1       Recent Labs      04/11/17   1443   ALB  4.2   ALKPHOS  83   ALT  37   AST  53*   DBILI  0.1   TBILI  0.4   TPROT  8.0       No results for input(s): INR, PTT in the last 72 hours.    Invalid input(s): PT    No results found for: CHOL, TRIG, HDL, LDL    No results found for: TSH, FREET4, T3FREE    No results found for: HGBA1C    Imaging:  Per Radiology: Ct Head W/o Iv Con    Result Date: 04/11/2017  HISTORY:71 years Male Altered mental status TECHNIQUE: CT of the head was performed without IV contrast. COMPARISON: None. FINDINGS: There is diffuse cerebral volume loss with ex vacuo dilatation of the ventricles. 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 Additionally, wedge-shaped encephalomalacia is seen of the left paracentral frontal lobe

## 2017-04-12 NOTE — Progress Notes
1. Turning over in bed (including adjusting bedclothes, sheets and blankets)?  2. Moving from lying on back to sitting on the side of the bed?  3. Sitting down on and standing up from a chair with arms? 2  2  2    HOW MUCH HELP FROM ANOTHER PERSON DOES THE PATIENT CURRENTLY NEED?    4. Moving to and from a bed to a chair (including a wheelchair)?  5. Need help to walk in hospital room? 2  2   Scoring: 1 = total (dependent)/unable; 2 = a lot (max/mod assist); 3 = a little (min assist/CGA/SBA/SUP); 4 = none (independent or modified independent).    AM-PAC BASIC MOBILITY SCALE SHORT FORM (without stairs): Raw Score: 10. AM-PAC Score: 34.07. CMS Score: 71.66% - CL    Treatment on Evaluation   ? None     Post Treatment:   Patient Position/Safety: Supine, Bed alarm on, Bed rails up, Tray table within reach, Handoff to nurse on patient position, RN taking over bedside    Assessment: Patient demonstrates high burden of care and high fall risk with functional mobility. He is confused and disoriented and per patient's family he appears near baseline level of functional mobility since July 2018 at which time this rapid decline onset. He will benefit from skilled PT to promote automaticity of bed mobilty and transfers to reduce burden of care at next level of care. He will likely need LTC post discharge.    Problem list:  ? impaired bed mobility  ? impaired transfers  ? impaired gait  ? balance deficits  ? cognition deficits  ? impaired judgment  ? coordination deficits  ? increased risk for falls  ? decreased activity tolerance  ? decreased ability to ascend and descend to stairs    Other Recommended Services: NA    Discharge Recommendations:   ? Discharge Disposition: Long Term Care (LTC)    ? DME Recommendations:  1. To be determined    **Note: Discharge recommendations may change based on patient progress. Please refer to the most updated progress note for current discharge recommendations.

## 2017-04-12 NOTE — ED Provider Notes
Glucose (Meter) 115 (*) 60.0 - 99.0 mg/dL   LIPASE - Normal    Lipase 18  0 - 60 U/L   MAGNESIUM - Normal    Magnesium 2.1  1.8 - 2.6 mg/dL   PHOSPHORUS - Normal    Phosphorus,Inorganic 3.4  2.5 - 4.5 mg/dL   ACETAMINOPHEN LEVEL - Normal    Acetaminophen Level <1.0  0.0 - 35.0 mcg/mL   POCT TROPININ I - Normal    Troponin I (Point of Care) <0.05  0.00 - 0.23 ng/mL   ETHYL ALCOHOL    Ethyl Alcohol <10.0  <=10.0 mg/dL    Alcohol g/dL <8.11<0.01  g//dL   POCT GLUCOSE   POCT URINALYSIS AUTO W/O MICROSCOPY   POCT LACTIC ACID - (RESP)   LACTIC ACID JX    Specimen Type Mixed Venous      Site LINE      Allens Test No      Lactate (Point of Care) 1.50  0.70 - 2.70 mmol/L   POCT URINALYSIS AUTO W/O MICROSCOPY    Color -Ur Yellow      Clarity, UA Clear      Spec Grav 1.015  1.003 - 1.030    pH 6.5  4.5 - 8.0    Urobilinogen -Ur 1.0  <=2.0 E.U./dL    Nitrite -Ur Negative  Negative    Protein-UA Negative  Negative mg/dL    Glucose -Ur Negative  Negative mg/dL    Blood, UA Negative  Negative    WBC, UA Negative  Negative    Bilirubin -Ur Negative  Negative    Ketones -UR Negative  Negative   POCT TROPININ I   CBC AND DIFFERENTIAL         Imaging (Read by ED Provider):  Per Radiology: Ct Head W/o Iv Con    Result Date: 04/11/2017  HISTORY:71 years Male Altered mental status TECHNIQUE: CT of the head was performed without IV contrast. COMPARISON: None. FINDINGS: There is diffuse cerebral volume loss with ex vacuo dilatation of the ventricles. 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 Additionally, wedge-shaped encephalomalacia is seen of the left paracentral frontal lobe consistent with prior ischemic stroke of the left ACA. Extensive periventricular subcortical white matter disease is visualized. Additionally, hypoattenuated lesions are noted of the basal ganglia likely secondary to chronic lacunar infarcts. There is no evidence of herniation.

## 2017-04-12 NOTE — ED Provider Notes
History     Chief Complaint   Patient presents with   ? Altered Mental Status       HPI    No Known Allergies    Patient's Medications    No medications on file       Past Medical History:   Diagnosis Date   ? Cerebral artery occlusion with cerebral infarction (CMS-HCC code)    ? Dementia        No past surgical history on file.    No family history on file.    Social History     Social History   ? Marital status: N/A     Spouse name: N/A   ? Number of children: N/A   ? Years of education: N/A     Social History Main Topics   ? Smoking status: Former Smoker   ? Smokeless tobacco: None   ? Alcohol use None   ? Drug use: No   ? Sexual activity: Not Asked     Other Topics Concern   ? None     Social History Narrative   ? None       Review of Systems    Physical Exam     ED Triage Vitals [04/11/17 1336]   BP (!) 159/97   Pulse 94   Resp 16   Temp 36.6 ?C (97.9 ?F)   Temp src Oral   Height    Weight    SpO2 98 %   BMI (Calculated)              Physical Exam    Differential DDx: ***    Is this an Emergent Medical Condition? {SH ED EMERGENT MEDICAL CONDITION:[id number]}  409.901 FS  641.19 FS  627.732 (16) FS    ED Workup   Procedures    Labs:  - - No data to display      Imaging (Read by ED Provider):  {Imaging findings:[id number]}      EKG (Read by ED Provider):  {EKG [phone]}        ED Course & Re-Evaluation          MDM   Decide to obtain history from someone other than the patient: {SH ED Lamonte SakaiJX MDM - OBTAIN ZOXWRUE:45409}HISTORY:28378}    Decide to obtain previous medical records: [facility]{SH ED Lamonte SakaiJX MDM - PREVIOUS MED REC - NO WJX:91478}YES:28380}    Clinical Lab Test(s): {SH ED Lamonte SakaiJX MDM ORDERED AND REVIEWED:28124}    Diagnostic Tests (Radiology, EKG): {SH ED Lamonte SakaiJX MDM ORDERED AND REVIEWED:28124}    Independent Visualization (ED US, Wet Prep, Other): {SH ED Lamonte SakaiJX MDM NO YES GNFAOZHY:86578}WILDCARD:26444}    Discussed patient with NON-ED Provider: {SH ED Lamonte SakaiJX MDM - ANOTHER PROVIDER:28381}      ED Disposition   ED Disposition: No ED Disposition Set

## 2017-04-12 NOTE — H&P
Motor Leg, Left (6a. ): No drift. Leg holds 30-degree position for full 5 seconds.  Motor Leg, Right (6b. ): No drift.  Leg holds 30-degree position for full 5 seconds.  Limb Ataxia (7. ): Absent  Sensory (8. ): Normal, no sensory loss  Best Language (9. ): No aphasia  Dysarthria (10. ): Normal  Extinction and Inattention (11.)  (formerly Neglect) : No abnormality  Total: 1      Marinell BlightJames Mccalister has not received tPA OR IR intervention.  Stroke Measures     STK-1: DVT PPX (by day of or day after admission): yes  STK-4: The patient was NOT a candidate for tPA.   STK-5: Antiplatelet (or anticoagulant) therapy will be started by end of hospital day 2 yes  STK-8: Stroke education ordered: yes  STK-10: Assessed for rehabilitation: Yes  CSTK-1: NIHss rounding tab completed within 12 hours: yes       Assessment and Plan     Marinell BlightJames Tomassetti is a [age] y.o. 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    Fayrene FearingJames did not receive tPA. The patient was NOT a tPA candidate: Deficit does not necessitate intervention , The patient did NOT receive IR intervention because: NIHSS <6, no vascular imaging at time of stroke alert    Concern for new acute ischemic stroke in left temporal horn region as seen by a hypodensity in head CT. Differential also includes vascular dementia, and CJD given the gradual but rapid mental decline. Other dementias such as Lewy body dementia is also present given his hallucinations. Wernicke's or korsakoff psychosis also on differential due to his history of alcoholism. Malignancy and metastasis also on differential given his significant smoking history.     Plan:   Imaging: MRI Brain without and with contrast   CTA Head and CTA Neck.  Will start on thiamin for wernicke's/korsakoff.   Will initiate anticoagulation: patient does not have afib

## 2017-04-12 NOTE — ED Provider Notes
ED Course & Re-Evaluation     ED Course as of Apr 11 1901   Sat Apr 11, 2017   1424 Rectal temp 98.65F. Guiac negative. Barbara CowerJason RN present   [AG]   1427 72 yo PMH dementia, HTN, Alcohol abuse, smoking, Peripheral artery disease with stents in both legs not on anticoagulation, hx of 3 strokes with most recent in September and December. [facility] 2 weeks ago brought in by [facility] from nursing home. Not slept in 3 days. Aggitated at 1am. Patient unable to give clear history. Denies CP, abdominal pain, SOB, or fevers. A&Ox1 which is baseline per family. Patient more generalized weakness in past 2 weeks needing to use wheelchair at nursing home versus walker when left [facility]. More aggitated, hallucinating people who are not there. Nursing home given extra trazadone because patient not sleeping at night. Family concerned for patient's care   [AG]   1428 Calling for family in waiting room   [AG]   1630 Swelling of left temporal lobe collapsing left ventricle concerning for subacute stroke of approximately 303 days old per radiology. Recommend follow-up MRI  [AG]   1725 Neurology consulted  [AG]      ED Course User Index  [AG] Aggie CosierGrozenski, Andrew, MD         MDM   Decide to obtain history from someone other than the patient: {SH ED Lamonte SakaiJX MDM - OBTAIN ZOXWRUE:45409}HISTORY:28378}    Decide to obtain previous medical records: [facility]{SH ED Lamonte SakaiJX MDM - PREVIOUS MED REC - NO WJX:91478}YES:28380}    Clinical Lab Test(s): {SH ED Lamonte SakaiJX MDM ORDERED AND REVIEWED:28124}    Diagnostic Tests (Radiology, EKG): {SH ED Lamonte SakaiJX MDM ORDERED AND REVIEWED:28124}    Independent Visualization (ED US, Wet Prep, Other): {SH ED Lamonte SakaiJX MDM NO YES GNFAOZHY:86578}WILDCARD:26444}    Discussed patient with NON-ED Provider: {SH ED Lamonte SakaiJX MDM - ANOTHER IONGEXBM:84132}PROVIDER:28381}      ED Disposition   ED Disposition: No ED Disposition Set      ED Clinical Impression   ED Clinical Impression:   No Clinical Impression Set      ED Patient Status   Patient Status:

## 2017-04-12 NOTE — ED Attestation Note
Attestation   I saw and evaluated the patient. I reviewed and agree with the findings and plan as documented in the note. I reviewed the patient's history. I discussed this patient with the resident/fellow.              Mardene SayerEraso, Daniel, MD  04/12/17 [phone]

## 2017-04-12 NOTE — H&P
Initiate antiplatelet? Which one?: will receive ASA 325 once.  TTE to check for cardiac abnormalities including an embolic source  Telemetry to monitor for a-fib  Obtain A1c - if Over 7.4 with no previously known DM, contact Diabetic Educator   Obtain LFTs & fasting lipids; obtain TSH  Statin therapy pending LDLs & LFTs  Outside time window for a permissive htn  Swallow screen  PHQ2 Depression screening before discharge if patient is positive for stroke  Stroke Education (ordered in epic)   Will patient require rehab services?: PT ordered and OT ordered  Vitals / neuro checks q4 hours  DVT PPX: SCDs and Heparin  OOB w/ assistance    Problem List Reviewed (Y/N):y  Rounding tab has been completed     Social: Full Code; Primary Emergency ContactTyrone Sage: NADEL Knaus, Home Phone: [phone]  Disposition: Admit to [facility]    Lattie HawPouya Shooliz, MD  8:20 PM 04/11/2017

## 2017-04-12 NOTE — Progress Notes
Spoke w/ Dr Roxan Hockeyobinson- pt is becoming more agitated, pulling off tele, pulling off condom cath, trying to get up every 3 minutes. Hallucinating people in the room. Unable to be reoriented to situation. MD to order medication to calm patient, said to re-address MRI/CT scan later today.

## 2017-04-12 NOTE — ED Provider Notes
History     Chief Complaint   Patient presents with   ? Altered Mental Status       72 yo PMH19 dementia, HTN, Alcohol abuse, smoking, Peripheral artery disease with stents in both legs not on anticoagulation, hx of 3 strokes with most recent in September and December. [facility] weeks ago brought in by [facility] from nursing home. Not slept in 3 days. Aggitated at 1am. Patient unable to give clear history. Denies CP, abdominal pain, SOB, or fevers. A&Ox1 which is baseline per family. Patient more generalized weakness in past 2 weeks needing to use wheelchair at nursing home versus walker when left [facility]. More aggitated, hallucinating people who are not there. Nursing home given extra trazadone because patient not sleeping at night. Family concerned for patient's care        The history is provided by the patient.   Altered Mental Status   Presenting symptoms: behavior changes, confusion, lethargy and partial responsiveness    Severity:  Severe  Most recent episode:  More than 2 days ago  Episode history:  Continuous  Duration:  2 weeks  Timing:  Constant  Progression:  Worsening  Chronicity:  New  Context: dementia and nursing home resident    Associated symptoms: agitation, hallucinations and weakness    Associated symptoms: no abdominal pain, no bladder incontinence, no decreased appetite, no difficulty breathing, no fever, no headaches, no light-headedness, no nausea, no palpitations, no slurred speech and no vomiting        No Known Allergies    Patient's Medications    No medications on file       Past Medical History:   Diagnosis Date   ? Cerebral artery occlusion with cerebral infarction (CMS-HCC code)    ? Dementia        No past surgical history on file.    No family history on file.    Social History     Social History   ? Marital status: Divorced     Spouse name: N/A   ? Number of children: N/A   ? Years of education: N/A     Social History Main Topics

## 2017-04-12 NOTE — H&P
consistent with prior ischemic stroke of the left ACA. Extensive periventricular subcortical white matter disease is visualized. Additionally, hypoattenuated lesions are noted of the basal ganglia likely secondary to chronic lacunar infarcts. There is no evidence of herniation. No evidence of intra-axial fluid collection or  intraparenchymal hemorrhage. No evidence of midline shift or mass effect. The ventricles, foci and cisterns are enlarged. There is nonspecific asymmetry of the temporal horns particularly involving the left temporal horn (series 4 image 122). Minimal hypoattenuated appearance of the preserved adjacent parenchyma is noted (series 4 image 119). Constellation of findings may be seen in setting of acute on chronic ischemic infarct. There is no evidence of extra-axial fluid collection. The osseous structures and skull base are intact without evidence of lytic or blastic lesions. Heavy calcification of the intracranial portions of the ICAs and vertebral arteries. Mucosal thickening is visualized of the frontal, ethmoid, sphenoid and maxillary sinuses. There  are mucoid retention cysts visualized the bilateral maxillary sinuses. Mastoid air cells are pneumatized. The soft tissues and globes are unremarkable. There is evidence of prior left ocular lens surgical repair.     Asymmetry of the left temporal horn in comparison to the right with subtle hypoattenuated appearance of the adjacent parenchyma; in the appropriate clinical context, an acute on chronic  ischemic infarct would be among the considerations. Consider obtaining brain MRI without contrast to further evaluate. No acute intracranial hemorrhage. Extensive diffuse cerebral volume loss with ex vacuo dilatation of the ventricular system with extensive periventricular, subcortical white matter disease likely secondary to chronic microvascular disease. Intracranial atherosclerosis.

## 2017-04-12 NOTE — Progress Notes
this, will consider anesthesia tomorrow.          CTA Head and CTA Neck.  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             Will initiate anticoagulation: patient does not have afib             Initiate antiplatelet? Started home dose of ASA 81 + Brilinta 90mg  BID.  TTE to check for cardiac abnormalities including an embolic source  Telemetry to monitor for a-fib  Obtain A1c - if Over 7.4 with no previously known DM, contact Diabetic Educator   Obtain LFTs & fasting lipids; obtain TSH  Statin therapy pending LDLs & LFTs  Outside time window for a permissive htn  Starting home doses of lisinopril 40mg  BID, toprol 25mg  QD, nifedipine 30mg  QD  Swallow screen  PHQ2 Depression screening before discharge if patient is positive for stroke  Stroke Education (ordered in epic)   Will patient require rehab services?: PT ordered and OT ordered  Vitals / neuro checks q4 hours  DVT PPX: SCDs and Heparin  OOB w/ assistance  - Sent for EEG to search for signs of CJD.  - Started on home dose of trazodone.  - Will send for LP therapeutic + diagnostic, with CJD precautions. Accompanying labs will be also sent.      Lattie HawPouya Shooliz , MD  Neurology, PGY-3   9:25 AM; 04/12/2017
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
## 2017-04-12 NOTE — H&P
?   Alcohol use Not on file   ? Drug use: No   ? Sexual activity: Not on file     Other Topics Concern   ? Not on file     Social History Narrative   ? No narrative on file       Baseline MRS Score:     3  Moderate disability; requiring some help, but able to walk without assistance       I have reviewed the past medical, surgical, family and social history. The patient's problem list has been updated and reviewed.     Home Medications:  Current Outpatient Medications    Not on File       No Known Allergies      Review of Systems: full review of systems negative excepting that which is mentioned in the HPI.     Objective     Patient Vitals for the past 8 hrs:   BP Temp Temp src Pulse Resp SpO2   04/11/17 1942 (!) 159/95 - - 71 15 99 %   04/11/17 1801 (!) 146/95 - - 80 16 98 %   04/11/17 1604 (!) 175/103 - - 71 19 98 %   04/11/17 1500 (!) 159/101 - - 73 23 97 %   04/11/17 1425 - 36.7 ?C (98 ?F) Rectal - - -   04/11/17 1405 - - - - - 98 %   04/11/17 1359 (!) 153/112 - - 88 13 98 %   04/11/17 1336 (!) 159/97 36.6 ?C (97.9 ?F) Oral 94 16 98 %       Physical Exam:  General: alert, cooperative, no apparent distress, appears stated age, well nourished, well developed   CV: regular rhythm, S1/S2 present, no MRG; pulses in extremities palpable  ENT:  Lips, teeth and gums intact without abrasion, Oropharynx is clear without exudate. Hearing intact.   Eyes: conjunctivae/corneas clear, no ptosis of the eyelids, Pupils PERRLA, Irises clear  Respiratory:  Good respiratory effort, Lungs CTA/BL  MSK:  Gait and Station deferred pending PT eval, digits without deformity, nails present and intact   Skin: intact, warm and dry, no rashes on visual inspection of exposed skin   Neurologic:  AAO to person only   Cranial nerves:   I: not tested   II, III, IV, VI: visual fields intact; PERRL with normal confrontation & consensual response B/L; EOMI; no ptosis; no conjugate or asymmetrical nystagmus

## 2017-04-12 NOTE — ED Provider Notes
History     Chief Complaint   Patient presents with   ? Altered Mental Status       HPI    No Known Allergies    Patient's Medications    No medications on file       Past Medical History:   Diagnosis Date   ? Cerebral artery occlusion with cerebral infarction (CMS-HCC code)    ? Dementia        No past surgical history on file.    No family history on file.    Social History     Social History   ? Marital status: N/A     Spouse name: N/A   ? Number of children: N/A   ? Years of education: N/A     Social History Main Topics   ? Smoking status: Former Smoker   ? Smokeless tobacco: None   ? Alcohol use None   ? Drug use: No   ? Sexual activity: Not Asked     Other Topics Concern   ? None     Social History Narrative   ? None       Review of Systems    Physical Exam     ED Triage Vitals [04/11/17 1336]   BP (!) 159/97   Pulse 94   Resp 16   Temp 36.6 ?C (97.9 ?F)   Temp src Oral   Height    Weight    SpO2 98 %   BMI (Calculated)              Physical Exam   Constitutional: He is oriented to person, place, and time. He appears well-developed and well-nourished. No distress.   HENT:   Head: Normocephalic and atraumatic.   Right Ear: External ear normal.   Left Ear: External ear normal.   Nose: Nose normal.   Mouth/Throat: Oropharynx is clear and moist.   Eyes: Pupils are equal, round, and reactive to light. Conjunctivae and EOM are normal.   Neck: Normal range of motion. Neck supple. No tracheal deviation present.   Cardiovascular: Normal rate, regular rhythm, S1 normal, S2 normal, normal heart sounds, intact distal pulses and normal pulses.    No murmur heard.  Pulses:       Radial pulses are 2+ on the right side, and 2+ on the left side.        Dorsalis pedis pulses are 2+ on the right side, and 2+ on the left side.        Posterior tibial pulses are 2+ on the right side, and 2+ on the left side.   Pulmonary/Chest: Effort normal and breath sounds normal. No accessory

## 2017-04-12 NOTE — ED Provider Notes
[facility]. More aggitated, hallucinating people who are not there. Nursing home given extra trazadone because patient not sleeping at night. Family concerned for patient's care   [AG]   1428 Calling for family in waiting room   [AG]   1630 Swelling of left temporal lobe collapsing left ventricle concerning for subacute stroke of approximately 543 days old per radiology. Recommend follow-up MRI  [AG]   1725 Neurology consulted  [AG]   1956 Admitted to neurology.   [AG]      ED Course User Index  [AG] Aggie CosierGrozenski, Andrew, MD         MDM   Decide to obtain history from someone other than the patient: No    Decide to obtain previous medical records: No    Clinical Lab Test(s): Ordered and Reviewed    Diagnostic Tests (Radiology, EKG): Ordered and Reviewed    Independent Visualization (ED US, Wet Prep, Other): No    Discussed patient with NON-ED Provider: Admitting Team      ED Disposition   ED Disposition: Admit      ED Clinical Impression   ED Clinical Impression:   Altered mental status, unspecified altered mental status type  History of dementia  History of stroke  Weakness  Secondary hypertension      ED Patient Status   Patient Status:   Fair        ED Medical Evaluation Initiated   Medical Evaluation Initiated:  Yes, filed at 04/11/17 1415  by Aggie CosierGrozenski, Andrew, MD            Aggie CosierGrozenski, Andrew, MD  Resident  04/11/17 2325

## 2017-04-12 NOTE — ED Provider Notes
ED Clinical Impression   ED Clinical Impression:   No Clinical Impression Set      ED Patient Status   Patient Status:   {SH ED JX PATIENT STATUS:[phone]}        ED Medical Evaluation Initiated   Medical Evaluation Initiated:  Yes, filed at 04/11/17 1415  by Aggie CosierGrozenski, Andrew, MD

## 2017-04-12 NOTE — Progress Notes
Coordination: finger-to-nose intact B/L; no signs of dysmetria              Mild tremors present thoughout with almost any movement.  ?  Labs:  Recent Labs      04/11/17   1444   WBC  7.11   HGB  11.2*   HCT  35.5*   MCV  89.9   NEUTROPCT  49.3       Recent Labs      04/11/17   1443   NA  140   K  3.9   CL  101   CO2  26   BUN  18   CREATININE  1.17   GLU  124*   CALCIUM  9.2   MG  2.1       Recent Labs      04/11/17   1443  04/11/17   2050   ALB  4.2  4.1   ALKPHOS  83  82   ALT  37  36   AST  53*  51*   DBILI  0.1  0.1   TBILI  0.4  0.5   TPROT  8.0  7.9       No results for input(s): INR, PTT in the last 72 hours.    Invalid input(s): PT    Lab Results   Component Value Date/Time    CHOL 125 04/12/2017 06:07 AM    TRIG 85 04/12/2017 06:07 AM    HDL 31 (L) 04/12/2017 06:07 AM    LDL 77 04/12/2017 06:07 AM       Lab Results   Component Value Date    TSH 1.860 04/11/2017       Lab Results   Component Value Date/Time    HGBA1C 5.6 04/12/2017 06:07 AM       Imaging:  Per Radiology: Ct Head W/o Iv Con    Result Date: 04/11/2017  HISTORY:71 years Male Altered mental status TECHNIQUE: CT of the head was performed without IV contrast. COMPARISON: None. FINDINGS: There is diffuse cerebral volume loss with ex vacuo dilatation of the ventricles. 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 Additionally, wedge-shaped encephalomalacia is seen of the left paracentral frontal lobe consistent with prior ischemic stroke of the left ACA. Extensive periventricular subcortical white matter disease is visualized. Additionally, hypoattenuated lesions are noted of the basal ganglia likely secondary to chronic lacunar infarcts. There is no evidence of herniation. No evidence of intra-axial fluid collection or  intraparenchymal hemorrhage. No evidence of midline shift or mass effect. The ventricles,

## 2017-04-12 NOTE — ED Notes
29 min heads up called to [facility] [address]

## 2017-04-12 NOTE — Progress Notes
Pt unable to complete MRI or CTa despite his PRN ativan administration. Per MRI tech, pt was too agitated and not compliant. Air mail to [facility] managerneuro    Sent At [employer] QuickReply  Capable Tracking  Number Message Status 12:27 AM  US/Central  04/12/2017 [id number] No [id number] Message accepted   Total of 1 message.   All times shown are US/Central.

## 2017-04-12 NOTE — Progress Notes
glucose 16 g Oral PRN   haloperidol lactate 5 mg Intravenous Q6H PRN   labetalol 10 mg Intravenous Q4H PRN   perflutren lipid microspheres 0.2-1.3 mL Intravenous PRN         Physical Exam: Reviewed and is up to date as of today as below.   General: alert, cooperative, no apparent distress, appears stated age, well nourished, well developed   CV: regular rhythm, S1/S2 present, no MRG; pulses in extremities palpable  ENT:  Lips, teeth and gums intact without abrasion, Oropharynx is clear without exudate. Hearing intact.   Eyes: conjunctivae/corneas clear, no ptosis of the eyelids, Pupils PERRLA, Irises clear  Respiratory:  Good respiratory effort, Lungs CTA/BL  MSK:  Gait and Station deferred pending PT eval, digits without deformity, nails present and intact   Skin: intact, warm and dry, no rashes on visual inspection of exposed skin              Neurologic:  AAO to person only              Cranial nerves:              I: not tested              II, III, IV, VI: visual fields intact; PERRL with normal confrontation & consensual response B/L; EOMI; no ptosis; no conjugate or asymmetrical nystagmus              V 1/2/3: sensation is intact on forehead, cheeks, and jaw region              VII: no facial droop; facial symmetry while smiling & wrinkling of forehead; tight lid closure              VIII: able to hear throughout the history taking process              IX & X: symmetric palatal elevation              XI: normal jaw and shoulder strength against resistance              XII: tongue is symmetrical & midline with no atrophy or fasciculation              Speech: normal              Motor:  normal bulk, normal tone; no drift              Strength:  5/5 in all muscle groups excepting: left arm 4+/5 strength proximal>distal.              Reflexes: 2+ and symmetric throughout excepting: none.               Sensory: intact to light touch in all 4 extremities excepting: none.

## 2017-04-12 NOTE — Progress Notes
foci and cisterns are enlarged. There is nonspecific asymmetry of the temporal horns particularly involving the left temporal horn (series 4 image 122). Minimal hypoattenuated appearance of the preserved adjacent parenchyma is noted (series 4 image 119). Constellation of findings may be seen in setting of acute on chronic ischemic infarct. There is no evidence of extra-axial fluid collection. The osseous structures and skull base are intact without evidence of lytic or blastic lesions. Heavy calcification of the intracranial portions of the ICAs and vertebral arteries. Mucosal thickening is visualized of the frontal, ethmoid, sphenoid and maxillary sinuses. There  are mucoid retention cysts visualized the bilateral maxillary sinuses. Mastoid air cells are pneumatized. The soft tissues and globes are unremarkable. There is evidence of prior left ocular lens surgical repair.     Asymmetry of the left temporal horn in comparison to the right with subtle hypoattenuated appearance of the adjacent parenchyma; in the appropriate clinical context, an acute on chronic  ischemic infarct would be among the considerations. Consider obtaining brain MRI without contrast to further evaluate. No acute intracranial hemorrhage. Extensive diffuse cerebral volume loss with ex vacuo dilatation of the ventricular system with extensive periventricular, subcortical white matter disease likely secondary to chronic microvascular disease. Intracranial atherosclerosis. Encephalomalacia of the bilateral MCA territory, left greater than right as well as left frontal lobe as described above consistent with remote ischemic infarcts Dr. Laurian BrimAnterior Grozenski was notified of the possible left temporal lobe acute on chronic ischemic infarct & brain MRI recommendation by telephone on 04/11/2016 at 4:26 PM.  This individual acknowledged receipt of the information and repeated it back. I personally reviewed the

## 2017-04-12 NOTE — Progress Notes
Patient leaving unit for MRI, medicated with Ativan. Unable to give PO Seroquel, patient will not wake up to take medication. Respirations even and nonlabored.

## 2017-04-12 NOTE — ED Provider Notes
Alkaline Phosphatase 83  40 - 129 IU/L    AST 53 (*) 14 - 33 IU/L    ALT 37  10 - 42 IU/L    Total Protein 8.0  6.5 - 8.3 g/dL    ALBUMIN/GLOBULIN RATIO 1.1  (calc)    Calc Total Globuin 3.8  gm/dL   OSMOLALITY BLOOD - Abnormal     Osmolality 306 (*) 274 - 298 mosm/Kg   SALICYLATE LEVEL - Abnormal     Salicylate Lvl <0.3 (*) 3.0 - 29.0 mg/dL   CBC AUTODIFF - Abnormal     WBC 7.11  4.5 - 11 x10E3/uL    RBC 3.95 (*) 4.50 - 6.30 x10E6/uL    Hemoglobin 11.2 (*) 14.0 - 18.0 g/dL    Hematocrit 47.835.5 (*) 40.0 - 54.0 %    MCV 89.9  82.0 - 101.0 fl    MCH 28.4  27.0 - 34.0 pg    MCHC 31.5  31.0 - 36.0 g/dL    RDW 29.516.2 (*) 62.112.0 - 16.1 %    Platelet Count 356  140 - 440 thou/cu mm    MPV 9.9  9.5 - 11.5 fl    nRBC % 0.0  0.0 - 1.0 %    Absolute NRBC Count 0.00      Neutrophils % 49.3  34.0 - 73.0 %    Lymphocytes % 34.2  25.0 - 45.0 %    Monocytes % 10.0 (*) 2.0 - 6.0 %    Eosinophils % 5.5 (*) 1.0 - 4.0 %    Immature Granulocytes % 0.4  0.0 - 2.0 %    Neutrophils Absolute 3.51  1.80 - 8.70 x10E3/uL    Lymphocytes Absolute 2.43  x10E3/uL    Monocytes Absolute 0.71  x10E3/uL    Eosinophils Absolute 0.39  x10E3/uL    Basophil Absolute 0.04  x10E3/uL    Absolute Immature Granulocytes 0.03 (*) 0 - 0 x10E3/uL    Basophils % 0.6  0 - 1 %   URINALYSIS/C&S IF POS - Abnormal     Color -Ur Yellow  Amber    Clarity, UA Clear  Hazy    Specific Gravity, Urine 1.012  1.003 - 1.030    pH, Urine 6.0  4.5 - 8.0    Protein-UA 30  (*) Negative mg/dL    Glucose -Ur Negative  Negative mg/dL    Ketones UA Negative  Negative mg/dL    Bilirubin -Ur Negative  Negative    Blood -Ur Negative  Negative    Nitrite -Ur Negative  Negative    Urobilinogen -Ur 2.0  (*) Normal mg/dL    Leukocyte Esterase UA Negative  Negative    RBC -Ur 4  0 - 5 /HPF    WBC -Ur 1  0 - 5 /HPF    Squam Epithel, UA <1  Not established /HPF    Hyaline Casts, UA 1  0 - 5 /LPF    Bacteria -Ur None seen  None seen /HPF    ASCORBIC ACID 40   20 mg/dL   POCT GLUCOSE - Abnormal

## 2017-04-12 NOTE — Plan of Care
Problem: Risk for Injury R/T Falls  Goal: Use a bed and/or chair alarm    Intervention: Fall precautions (yellow armband, signage posted, low bed, call bell within reach, three side rails on bed, bed lock, bed/chair alarm, walking aids, declutter room, etc.)  Fall precautions in place to prevent falls. Morse Fall Risk score 95. Yellow armband applied to patient. Bed in lowest position with wheels locked with 3/4 side rails up. Bed alarm activated. Reinforced importance of using call light when assistance in needed. Performing hourly rounding and will continue to monitor patient.

## 2017-04-12 NOTE — ED Provider Notes
?   Smoking status: Former Smoker   ? Smokeless tobacco: None   ? Alcohol use None   ? Drug use: No   ? Sexual activity: Not Asked     Other Topics Concern   ? None     Social History Narrative   ? None       Review of Systems   Constitutional: Negative for fever, fatigue and decreased appetite.   HENT: Negative for nasal congestion, sore throat and nasal discharge.    Eyes: Negative for visual disturbance.   Respiratory: Negative for cough, shortness of breath and wheezing.    Cardiovascular: Negative for chest pain and palpitations.   Gastrointestinal: Negative for nausea, vomiting, abdominal pain, diarrhea and constipation.   Genitourinary: Negative for bladder incontinence, dysuria and hematuria.   Musculoskeletal: Negative for myalgias, joint swelling and arthralgias.   Neurological: Positive for weakness. Negative for dizziness, syncope, light-headedness and headaches.   Psychiatric/Behavioral: Positive for hallucinations, confusion, agitation and altered mental status. Negative for behavioral problems.       Physical Exam     ED Triage Vitals [04/11/17 1336]   BP (!) 159/97   Pulse 94   Resp 16   Temp 36.6 ?C (97.9 ?F)   Temp src Oral   Height    Weight    SpO2 98 %   BMI (Calculated)              Physical Exam   Constitutional: He is oriented to person, place, and time. He appears well-developed and well-nourished. No distress.   HENT:   Head: Normocephalic and atraumatic.   Right Ear: External ear normal.   Left Ear: External ear normal.   Nose: Nose normal.   Mouth/Throat: Oropharynx is clear and moist.   Eyes: Pupils are equal, round, and reactive to light. Conjunctivae and EOM are normal.   Neck: Normal range of motion. Neck supple. No tracheal deviation present.   Cardiovascular: Normal rate, regular rhythm, S1 normal, S2 normal, normal heart sounds, intact distal pulses and normal pulses.    No murmur heard.  Pulses:       Radial pulses are 2+ on the right side, and 2+ on the left side.

## 2017-04-12 NOTE — Plan of Care
Attempted to give 1400 meds (neurontin and ditropan), patient opened eyes and quickly closed them, and refused taking the meds

## 2017-04-12 NOTE — Plan of Care
Patient back on floor from CT

## 2017-04-12 NOTE — H&P
Encephalomalacia of the bilateral MCA territory, left greater than right as well as left frontal lobe as described above consistent with remote ischemic infarcts Dr. Laurian BrimAnterior Grozenski was notified of the possible left temporal lobe acute on chronic ischemic infarct & brain MRI recommendation by telephone on 04/11/2016 at 4:26 PM.  This individual acknowledged receipt of the information and repeated it back. I personally reviewed the images and the residents findings and agree with the above. Read By Para March- Erick Blaudeau  Electronically Verified By - Para MarchErick Blaudeau  Released Date Time - 04/11/2017 6:58 PM  Resident - Lorane GellMarsela Hyska    Per Radiology: Xr Chest Single View    Result Date: 04/11/2017  PORTABLE CHEST X-RAY (SINGLE FRONTAL VIEW) Comparison: None available History: Altered mental status Findings: There has been a suboptimal degree of inspiration. Linear opacity is noted within the left lung  base, probably due to atelectasis although pulmonary scarring is a second possibility. The right lung is clear. No definite pleural abnormality is seen. The heart is at the upper limits of normal in size. There is tortuosity of the thoracic aorta but no acute abnormality of the hilar or mediastinal structures.     Impression: Left basilar subsegmental atelectasis vs. pulmonary scarring. Aortic tortuosity. Read By Burr Medico- Gregory Wynn M.D.  Electronically Verified By - Burr MedicoGregory Wynn M.D.  Released Date Time - 04/11/2017 3:08 PM  Resident -     NIH Stroke Scale  NIH Stroke Scale  Level of Consciousness (1a. ): Alert, keenly responsive  LOC Questions (1b. ): Answers one question correctly  LOC Commands (1c. ): Performs both tasks correctly  Best Gaze (2. ): Normal  Visual (3. ): No visual loss  Facial Palsy (4. ): Normal symmetrical movements  Motor Arm, Left (5a. ): No drift. Limb holds 90 (or 45) degrees for full 10 seconds.  Motor Arm, Right (5b. ): No drift. Limb holds 90 (or 45) degrees for full 10 seconds.

## 2017-04-12 NOTE — Progress Notes
Department of Case Management  Initial Discharge Planning       NAME: Jon Meza    MRN: [id number]    AGE: 72 y.o. DOB: [birth year] Date of Admission: 04/11/2017      Payor: Payor: MEDICARE CAREPLUS HEALTH PLANS / Plan: MEDICARE CAREPLUS HEALTH PLANS / Product Type: HMO/POS /     Primary Care Physician: No primary care provider on file.    Pharmacy: No Pharmacies Listed    Readmission Interview Information: Has pt. been readmitted in last 30 days?: No       Living Arrangements: Patient resides with whom?: Other (comment) ([facility])    Advance Directive/Health Care Decision Maker: Advance Directive/Healthcare Decision Maker: Next of Kin (NOK) Proxy (comment) (04/12/17 1556)  Legal Documentation:      Service Currently Receiving prior to Admission:      Initial Assessment and Discharge Plan: Patient currently has altered mental status. Cm called spoke to Jon Meza (sister) and HCS. Regarding discharge planning, per Ms Laural BenesJohnson [facility] and she is not sure if she want patient to return to that facility at this time. Ms. Laural BenesJohnson was advised that PT is recommending LTC. Ms Laural BenesJohnson states she wants to talk to the remainder of thier family before she makes a decision on placement vs home care.     Additional Information:     Discharge Plan: LTC vs home    Discharge Pending: none    Discharge Date: Expected discharge date: 04/14/17      Jon Meza   04/12/2017 4:49 PM

## 2017-04-12 NOTE — Consults
Please see full H&P. Thank you.    Lattie HawPouya Shooliz, MD  04/11/2017 8:51 PM  Neurology Resident PGY-3

## 2017-04-12 NOTE — ED Provider Notes
muscle usage. No respiratory distress. He has no wheezes.   Abdominal: Soft. Bowel sounds are normal. He exhibits no distension. There is no tenderness.   Musculoskeletal: Normal range of motion. He exhibits no tenderness.   Neurological: He is alert and oriented to person, place, and time.   A&Ox1. Only able to answer yes/no. Not able to follow-commands    Skin: Skin is warm, dry and intact. No erythema.   No skin breakdown, no abscess, no hematomas    Psychiatric: He has a normal mood and affect. His behavior is normal. His speech is delayed. Cognition and memory are impaired.   Nursing note and vitals reviewed.      Differential DDx: ***    Is this an Emergent Medical Condition? {SH ED EMERGENT MEDICAL [phone]}  409.901 FS  641.19 FS  627.732 (16) FS    ED Workup   Procedures    Labs:  - - No data to display      Imaging (Read by ED Provider):  {Imaging findings:[phone]}      EKG (Read by ED Provider):  {EKG [phone]}        ED Course & Re-Evaluation     ED Course as of Apr 11 1901   Sat Apr 11, 2017   1424 Rectal temp 98.45F. Guiac negative. Barbara CowerJason RN present   [AG]   1427 72 yo PMH dementia, HTN, Alcohol abuse, smoking, Peripheral artery disease with stents in both legs not on anticoagulation, hx of 3 strokes with most recent in September and December. [facility] weeks ago brought in by [facility] from nursing home. Not slept in 3 days. Aggitated at 1am. Patient unable to give clear history. Denies CP, abdominal pain, SOB, or fevers. A&Ox1 which is baseline per family. Patient more generalized weakness in past 2 weeks needing to use wheelchair at nursing home versus walker when left [facility]. More aggitated, hallucinating people who are not there. Nursing home given extra trazadone because patient not sleeping at night. Family concerned for patient's care   [AG]   1428 Calling for family in waiting room   [AG]

## 2017-04-12 NOTE — Progress Notes
Department of Neurology  Division: General    Admit Date: 04/11/2017   LOS: 1 day   PCP: No primary care provider on file.    Hospital course: Jon Meza is a 72 y.o. 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     Subjective:     In the past 24 hours, Jon Meza has experienced no new acute complaints. Patient is stable. He was unable to complete the MRI and CTAs due to agitation. The ativan was not enough and he received benadryl and haldol.      ROS: the pertinent systems were reviewed and are negative except for as above.    Objective:     Vital Signs: Last Filed Vitals Signs: 24 Hour Range   BP: 143/75 (01/13 0751) BP: (134-175)/(70-112)    Temp: 36.4 ?C (97.5 ?F) (01/13 0751) Temp:  [35.3 ?C (95.6 ?F)-37.1 ?C (98.7 ?F)]    Pulse: 50 (01/13 0751) Pulse:  [50-94]    Resp: 17 (01/13 0751) Resp:  [13-23]    SpO2: 97 % (01/13 0751) SpO2:  [97 %-99 %]    Respiratory Device: Room Air / (None)            Intake/Output Summary (Last 24 hours) at 04/12/17 0925  Last data filed at 04/12/17 0400   Gross per 24 hour   Intake                0 ml   Output              650 ml   Net             -650 ml         SCHEDULED MEDS:  ? aspirin  81 mg Oral daily   ? atorvastatin  80 mg Oral Nightly   ? docusate sodium  100 mg Oral daily   ? gabapentin  200 mg Oral TID   ? heparin  5,000 Units Subcutaneous Q12H [facility]   ? lisinopril  40 mg Oral BID   ? metoprolol succinate  25 mg Oral daily   ? nicotine  1 patch Transdermal daily   ? nicotine  1 patch Transdermal daily   ? NIFEdipine  30 mg Oral daily   ? oxybutynin  5 mg Oral TID   ? thiamine - folic acid IVPB   Intravenous daily   ? ticagrelor  90 mg Oral BID   ? traZODone  50 mg Oral Nightly     IV INFUSIONS:    PRN MEDS:    acetaminophen 650 mg Oral Q4H PRN   dextrose 30 mL Intravenous PRN

## 2017-04-12 NOTE — ED Provider Notes
No evidence of intra-axial fluid collection or  intraparenchymal hemorrhage. No evidence of midline shift or mass effect. The ventricles, foci and cisterns are enlarged. There is nonspecific asymmetry of the temporal horns particularly involving the left temporal horn (series 4 image 122). Minimal hypoattenuated appearance of the preserved adjacent parenchyma is noted (series 4 image 119). Constellation of findings may be seen in setting of acute on chronic ischemic infarct. There is no evidence of extra-axial fluid collection. The osseous structures and skull base are intact without evidence of lytic or blastic lesions. Heavy calcification of the intracranial portions of the ICAs and vertebral arteries. Mucosal thickening is visualized of the frontal, ethmoid, sphenoid and maxillary sinuses. There  are mucoid retention cysts visualized the bilateral maxillary sinuses. Mastoid air cells are pneumatized. The soft tissues and globes are unremarkable. There is evidence of prior left ocular lens surgical repair.     Asymmetry of the left temporal horn in comparison to the right with subtle hypoattenuated appearance of the adjacent parenchyma; in the appropriate clinical context, an acute on chronic  ischemic infarct would be among the considerations. Consider obtaining brain MRI without contrast to further evaluate. No acute intracranial hemorrhage. Extensive diffuse cerebral volume loss with ex vacuo dilatation of the ventricular system with extensive periventricular, subcortical white matter disease likely secondary to chronic microvascular disease. Intracranial atherosclerosis. Encephalomalacia of the bilateral MCA territory, left greater than right as well as left frontal lobe as described above consistent with remote ischemic infarcts Dr. Laurian BrimAnterior Grozenski was notified of the possible left temporal lobe acute on chronic ischemic infarct & brain MRI recommendation

## 2017-04-12 NOTE — ED Provider Notes
1630 Swelling of left temporal lobe collapsing left ventricle concerning for subacute stroke of approximately 903 days old per radiology. Recommend follow-up MRI  [AG]   1725 Neurology consulted  [AG]      ED Course User Index  [AG] Aggie CosierGrozenski, Andrew, MD         MDM   Decide to obtain history from someone other than the patient: {SH ED Lamonte SakaiJX MDM - OBTAIN VWUJWJX:91478}HISTORY:28378}    Decide to obtain previous medical records: [facility]{SH ED Lamonte SakaiJX MDM - PREVIOUS MED REC - NO GNF:62130}YES:28380}    Clinical Lab Test(s): {SH ED Lamonte SakaiJX MDM ORDERED AND REVIEWED:28124}    Diagnostic Tests (Radiology, EKG): {SH ED Lamonte SakaiJX MDM ORDERED AND REVIEWED:28124}    Independent Visualization (ED US, Wet Prep, Other): {SH ED Lamonte SakaiJX MDM NO YES QMVHQION:62952}WILDCARD:26444}    Discussed patient with NON-ED Provider: {SH ED Lamonte SakaiJX MDM - ANOTHER PROVIDER:28381}      ED Disposition   ED Disposition: No ED Disposition Set      ED Clinical Impression   ED Clinical Impression:   No Clinical Impression Set      ED Patient Status   Patient Status:   {[facility]:[phone]}        ED Medical Evaluation Initiated   Medical Evaluation Initiated:  Yes, filed at 04/11/17 1415  by Aggie CosierGrozenski, Andrew, MD

## 2017-04-12 NOTE — ED Notes
[facility] Pt resting comfortably at this time with family at bedside. Pt alert and oriented to person and place. Pt disoriented to time and situation. Pt deneis any pain or medical complaints at this time. Pt respirations equal and unlabored. Per pts family this is pts baseline. RN will continue to monitor pt

## 2017-04-12 NOTE — ED Provider Notes
Dorsalis pedis pulses are 2+ on the right side, and 2+ on the left side.        Posterior tibial pulses are 2+ on the right side, and 2+ on the left side.   Pulmonary/Chest: Effort normal and breath sounds normal. No accessory muscle usage. No respiratory distress. He has no wheezes.   Abdominal: Soft. Bowel sounds are normal. He exhibits no distension. There is no tenderness.   Musculoskeletal: Normal range of motion. He exhibits no tenderness.   Neurological: He is alert and oriented to person, place, and time.   A&Ox1. Only able to answer yes/no. Not able to follow-commands    Skin: Skin is warm, dry and intact. No erythema.   No skin breakdown, no abscess, no hematomas    Psychiatric: He has a normal mood and affect. His behavior is normal. His speech is delayed. Cognition and memory are impaired.   Nursing note and vitals reviewed.      Differential DDx: Acidosis vs electrolyte abnormalities vs Infection vs Ischemia vs Uremia vs trauma vs psychosis vs other     Is this an Emergent Medical Condition? Yes - Severe Pain/Acute Onset of Symptons  409.901 FS  641.19 FS  627.732 (16) FS    ED Workup   Procedures    Labs:  -   BASIC METABOLIC PANEL - Abnormal        Result Value Ref Range    Sodium 140  135 - 145 mmol/L    Potassium 3.9  3.3 - 4.6 mmol/L    Chloride 101  101 - 110 mmol/L    CO2 26  21 - 29 mmol/L    Urea Nitrogen 18  6 - 22 mg/dL    Creatinine 1.17  0.67 - 1.17 mg/dL    BUN/Creatinine Ratio 15.4  6.0 - 22.0 (calc)    Glucose 124 (*) 71 - 99 mg/dL    Calcium 9.2  8.6 - 10.0 mg/dL    Osmolality Calc 282.7      Anion Gap 13  4 - 16 mmol/L    EGFR >59  mL/min/1.73M2    Comment:   Reference range: =>90 ml/min/1.73M2  eGFR estimates are unable to accurately differentiate levels of GFR above 60 ml/min/1.73M2.   HEPATIC FUNCTION PANEL - Abnormal     Albumin 4.2  3.8 - 4.9 g/dL    Total Bilirubin 0.4  0.2 - 1.0 mg/dL    Bilirubin, Direct 0.1  0.0 - 0.2 mg/dL    Bilirubin, Indirect 0.3  <0.11m/dL mg/dL

## 2017-04-12 NOTE — ED Notes
Transport staff at bedside, pt transported to 421 with all belongings, NAD noted at time of transport, pt deneis any pain

## 2017-04-12 NOTE — Progress Notes
Plan of Care: Patient will be seen 1-2 times per week for therapeutic activities, therapeutic exercise, balance, activity tolerance, patient/caregiver education and training and safety.    PT Evaluation Complexity   History Examination Clinical Presentation Decision Making   high (3 or more personal factors/comorbidities) high (addressing 4 or more elements) high (unstable) high (using standardized patient assessment instrument and/or measureable assessment of functional outcome)   PT Evaluation Complexity Charge: high     **Co-Treatment: NA  _________________________   Mertie MooresLauren M Casanova, PT   04/12/2017

## 2017-04-12 NOTE — Plan of Care
Problem: Risk for Injury R/T Falls  Goal: Prevent Falls  Outcome: Ongoing  Fall bundle in place- Close to nursing station, q1830min checks, bed alarm, socks, bed locked and low. Pt requires frequent reorientation. No evidence of learning. Will continue to monitor closely.    Problem: Knowledge Deficit Stroke  Goal: Understands dx, medications & necessary therapeutic regimen  Outcome: Ongoing  Attempted to educate pt on s/s stroke and risk factors. No evidence of learning. Handout placed at bedside, will re-assess learning throughout hospitalization.

## 2017-04-12 NOTE — ED Provider Notes
History     Chief Complaint   Patient presents with   ? Altered Mental Status       HPI    No Known Allergies    Patient's Medications    No medications on file       Past Medical History:   Diagnosis Date   ? Cerebral artery occlusion with cerebral infarction (CMS-HCC code)    ? Dementia        No past surgical history on file.    No family history on file.    Social History     Social History   ? Marital status: N/A     Spouse name: N/A   ? Number of children: N/A   ? Years of education: N/A     Social History Main Topics   ? Smoking status: Former Smoker   ? Smokeless tobacco: None   ? Alcohol use None   ? Drug use: No   ? Sexual activity: Not Asked     Other Topics Concern   ? None     Social History Narrative   ? None       Review of Systems    Physical Exam     ED Triage Vitals [04/11/17 1336]   BP (!) 159/97   Pulse 94   Resp 16   Temp 36.6 ?C (97.9 ?F)   Temp src Oral   Height    Weight    SpO2 98 %   BMI (Calculated)              Physical Exam   Cardiovascular: Normal rate, regular rhythm, S1 normal, S2 normal, normal heart sounds, intact distal pulses and normal pulses.    Pulses:       Radial pulses are 2+ on the right side, and 2+ on the left side.        Dorsalis pedis pulses are 2+ on the right side, and 2+ on the left side.        Posterior tibial pulses are 2+ on the right side, and 2+ on the left side.   Neurological:   A&Ox1. Only able to answer yes/no. Not able to follow-commands    Skin: Skin is warm and dry.   Psychiatric: He has a normal mood and affect. His behavior is normal. His speech is delayed. Cognition and memory are impaired.       Differential DDx: ***    Is this an Emergent Medical Condition? {SH ED EMERGENT MEDICAL CONDITION:[phone]}  409.901 FS  641.19 FS  627.732 (16) FS    ED Workup   Procedures    Labs:  - - No data to display      Imaging (Read by ED Provider):  {Imaging findings:[phone]}      EKG (Read by ED Provider):  {EKG findings:[phone]}

## 2017-04-12 NOTE — ED Notes
Gave report to micheala RN

## 2017-04-12 NOTE — ED Provider Notes
by telephone on 04/11/2016 at 4:26 PM.  This individual acknowledged receipt of the information and repeated it back. I personally reviewed the images and the residents findings and agree with the above. Read By Para March- Erick Blaudeau  Electronically Verified By - Para MarchErick Blaudeau  Released Date Time - 04/11/2017 6:58 PM  Resident - Lorane GellMarsela Hyska    Per Radiology: Xr Chest Single View    Result Date: 04/11/2017  PORTABLE CHEST X-RAY (SINGLE FRONTAL VIEW) Comparison: None available History: Altered mental status Findings: There has been a suboptimal degree of inspiration. Linear opacity is noted within the left lung  base, probably due to atelectasis although pulmonary scarring is a second possibility. The right lung is clear. No definite pleural abnormality is seen. The heart is at the upper limits of normal in size. There is tortuosity of the thoracic aorta but no acute abnormality of the hilar or mediastinal structures.     Impression: Left basilar subsegmental atelectasis vs. pulmonary scarring. Aortic tortuosity. Read By Burr Medico- Gregory Wynn M.D.  Electronically Verified By - Burr MedicoGregory Wynn M.D.  Released Date Time - 04/11/2017 3:08 PM  Resident -         EKG (Read by ED Provider):  Normal sinus with PVCs         ED Course & Re-Evaluation     ED Course as of Apr 12 2323   Sat Apr 11, 2017   1424 Rectal temp 98.9F. Guiac negative. Barbara CowerJason RN present   [AG]   1427 72 yo PMH dementia, HTN, Alcohol abuse, smoking, Peripheral artery disease with stents in both legs not on anticoagulation, hx of 3 strokes with most recent in September and December. [facility] 2 weeks ago brought in by [facility] from nursing home. Not slept in 3 days. Aggitated at 1am. Patient unable to give clear history. Denies CP, abdominal pain, SOB, or fevers. A&Ox1 which is baseline per family. Patient more generalized weakness in past 2 weeks needing to use wheelchair at nursing home versus walker when left St.

## 2017-04-12 NOTE — Plan of Care
Patient returned to floor from MRI

## 2017-04-12 NOTE — Plan of Care
Patient off the floor for CT

## 2017-04-12 NOTE — Progress Notes
Vitals: Vitals stable    Cognition:   ? Alert and Oriented to confused and disoriented  ? Command Following: Difficulty following commands, Follows commands with cues and repetition and Follows commands inconsistently    Right Upper Extremity:  ? Not Tested    Left Upper Extremity:  ? Not Tested    Right Lower Extremity:  ? not formally tested due to cognition, hamstrings appear tight.     Left Lower Extremity:  ? not formally tested due to cognition, hamstrings appear tight    Neurological Examination:  ? Sensation: Not Tested  ? Proprioception: Not Tested  ? Coordination: Not Tested  ? Motor Control: Not Tested  ? Tone: Not Tested  ? Reflexes: Not Tested    Balance:   ? Sitting: Static  Moderate Assist (Mod A)  ? Standing: Static  Moderate Assist (Mod A), Dynamic  Moderate Assist (Mod A), Maximal Assist (Max A), Max Verbal Cues, x2 person assist. Patient bent over reaching out of BOS looking for pants, inconsistently following commands. Crouched knees and unable to maintain upright midline positioning    Functional Activities:  Bed Mobility:  ? Supine to Sit: Moderate Assist (Mod A) with head of bed (HOB) elevated, assist at trunk and to initiate BLE movements  ? Sit to Supine: Moderate Assist (Mod A) with head of bed (HOB) elevated, assist at trunk and to initiate transition as patient was not starting to transition without physical guidance  ? Scooting: Moderate Assist (Mod A) with head of bed (HOB) flat, physical guidance to perform desired task  Transfers:  ? Sit to/from Stand: Maximal Assist (Max A), x2 person with B HHA  Locomotion:  ? GAIT: Patient ambulated 3 steps towards room dresser with B HHA, gait belt, Maximal Assist (Max A), x2 person. Gait Quality: forward flexed at waist and B crouched knees, shuffling feet     Outcome Measures:  [place] AM-PAC Basic Mobility Inpatient Short Form   (Without Stair Climbing):  HOW MUCH DIFFICULTY DOES THE PATIENT CURRENTLY HAVE? SCORE

## 2017-04-12 NOTE — ED Provider Notes
by telephone on 04/11/2016 at 4:26 PM.  This individual acknowledged receipt of the information and repeated it back. I personally reviewed the images and the residents findings and agree with the above. Read By Para March- Erick Blaudeau  Electronically Verified By - Para MarchErick Blaudeau  Released Date Time - 04/11/2017 6:58 PM  Resident - Lorane GellMarsela Hyska    Per Radiology: Xr Chest Single View    Result Date: 04/11/2017  PORTABLE CHEST X-RAY (SINGLE FRONTAL VIEW) Comparison: None available History: Altered mental status Findings: There has been a suboptimal degree of inspiration. Linear opacity is noted within the left lung  base, probably due to atelectasis although pulmonary scarring is a second possibility. The right lung is clear. No definite pleural abnormality is seen. The heart is at the upper limits of normal in size. There is tortuosity of the thoracic aorta but no acute abnormality of the hilar or mediastinal structures.     Impression: Left basilar subsegmental atelectasis vs. pulmonary scarring. Aortic tortuosity. Read By Burr Medico- Gregory Wynn M.D.  Electronically Verified By - Burr MedicoGregory Wynn M.D.  Released Date Time - 04/11/2017 3:08 PM  Resident -         EKG (Read by ED Provider):  Normal sinus with PVCs         ED Course & Re-Evaluation     ED Course as of Apr 11 1953   Sat Apr 11, 2017   1424 Rectal temp 98.3F. Guiac negative. Barbara CowerJason RN present   [AG]   1427 72 yo PMH dementia, HTN, Alcohol abuse, smoking, Peripheral artery disease with stents in both legs not on anticoagulation, hx of 3 strokes with most recent in September and December. [facility] 2 weeks ago brought in by [facility]D from nursing home. Not slept in 3 days. Aggitated at 1am. Patient unable to give clear history. Denies CP, abdominal pain, SOB, or fevers. A&Ox1 which is baseline per family. Patient more generalized weakness in past 2 weeks needing to use wheelchair at nursing home versus walker when left St.

## 2017-04-12 NOTE — Plan of Care
Problem: Discharge Planning  Goal: Safe effective discharge  Outcome: Met/Completed Date Met: 04/12/17  DCP ongoing per Next of kin

## 2017-04-12 NOTE — Progress Notes
images and the residents findings and agree with the above. Read By Para March- Jon Meza  Electronically Verified By - Para MarchErick Meza  Released Date Time - 04/11/2017 6:58 PM  Resident - Jon GellMarsela Meza    Per Radiology: Xr Chest Single View    Result Date: 04/11/2017  PORTABLE CHEST X-RAY (SINGLE FRONTAL VIEW) Comparison: None available History: Altered mental status Findings: There has been a suboptimal degree of inspiration. Linear opacity is noted within the left lung  base, probably due to atelectasis although pulmonary scarring is a second possibility. The right lung is clear. No definite pleural abnormality is seen. The heart is at the upper limits of normal in size. There is tortuosity of the thoracic aorta but no acute abnormality of the hilar or mediastinal structures.     Impression: Left basilar subsegmental atelectasis vs. pulmonary scarring. Aortic tortuosity. Read By Jon Meza- Jon Meza M.D.  Electronically Verified By - Jon MedicoGregory Meza M.D.  Released Date Time - 04/11/2017 3:08 PM  Resident -       Assessment & Plan:     Jon BlightJames Meza is a 72 y.o. 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     Concern for new acute ischemic stroke in left temporal horn region as seen by a hypodensity in head CT. Differential also includes vascular dementia, and CJD given the gradual but rapid mental decline. Other dementias such as Lewy body dementia is also present given his hallucinations. Wernicke's or korsakoff psychosis also on differential due to his history of alcoholism. IIH also in differential due to triad of gait issues + urinary retention + encephalopathy. Malignancy and metastasis also on differential given his significant smoking history.   ?  Plan:   Imaging: MRI Brain without and with contrast    - This time will attempt with seroquel + ativan. If unable to tolerate

## 2017-04-12 NOTE — Progress Notes
Physical Therapy Evaluation      Start Time (min): 1011   End Time (min): 1023  Total Time (min): 12  Room/Bed: 421/421-01    History of Present Illness: Jon Meza is a 72 y.o. male admitted on 04/11/2017 due to AMS. Found to have new subacute infarc. Differential per chart review includes vascular dementia versus CJD versus lewy body dementia versus wernickes.       ICD-10-CM ICD-9-CM   1. Altered mental status, unspecified altered mental status type R41.82 780.97   2. History of dementia Z86.59 V11.8   3. History of stroke Z86.73 V12.54   4. Weakness R53.1 780.79   5. Secondary hypertension I15.9 405.99     Past Medical History:   Diagnosis Date   ? CAD (coronary artery disease)    ? Cerebral artery occlusion with cerebral infarction (CMS-HCC code)    ? Dementia    ? Fall    ? Hyperlipidemia    ? Hypertension    ? MI, old    ? PVD (peripheral vascular disease) (CMS-HCC code)      Past Surgical History:   Procedure Laterality Date   ? STENT BMS - CORONARY     ? STENT DES - PERIPHERAL         Precautions:  ? FALL    Extremity Precautions:  ? None indicated    Orthotic, Protective, & Supportive Devices:  ? None    Living Environment/Function:  Lives: per patient's sister he was at a rehab facility immediately prior to this admission where [facility]. He has been in and out of the [facility] 4 times since October 2018. Patient stopped walking in July 2018 but often tries to get up and has fallen multiple times at home and in rehab facilities. Prior to this decline over the past few months patient was living with his sister and baby brother, neither of which can meet patient's current care needs.      Subjective: Patient reports he needs to find "some drawers to put on" Does not verbalize pain or demonstrate facial grimacing.     **Was an interpreter used: NA    Examination:  Observation: Recieved supine in bed. Patient fiddling with tele leads, condom cath on floor, LLE hanging off bed. RN aware.

## 2017-04-12 NOTE — Consults
DCP ongoing, Per Tyrone SageNadel Kissling (sister) HCS. She states she wants to make decision on placement vs home with her once she speaks to physician and family members regarding health status.

## 2017-04-12 NOTE — Consults
DCP ongoing, Per Tyrone SageNadel Petropoulos (sister) HCS. She states she wants to make decision on placement vs home with her once she speaks to physician regarding health status.

## 2017-04-12 NOTE — Consults
Assessment: Patient is at new baseline level with all ADLs, mobility, and transfers. Patient is at baseline with UE ROM/strength, cognition, coordination, sensation, and vision. Pt is not an appropriate skilled OT candidate, 2/2 level of progressive dementia, poor learning potential. Pt will need LTC placement. No skilled OT services indicated at this time.    Discharge Recommendations:   ? Discharge Disposition: Long Term Care (LTC)    ? DME Recommendations:  1. Already has necessary DMEs    Plan: D/C OT    OT Evaluation Complexity   History Examination Decision Making   low (brief review of history related to the presenting problem) low (addressing 1-3 performance deficits) low (limited amount of treatment options, no assessment modification, no co-morbidities)   OT Evaluation Complexity Charge: low     **Co-Treatment: NA  ____________________________   Huntley EstelleStacy Stockard, OT   04/12/2017

## 2017-04-12 NOTE — ED Provider Notes
[facility]. More aggitated, hallucinating people who are not there. Nursing home given extra trazadone because patient not sleeping at night. Family concerned for patient's care   [AG]   1428 Calling for family in waiting room   [AG]   1630 Swelling of left temporal lobe collapsing left ventricle concerning for subacute stroke of approximately 663 days old per radiology. Recommend follow-up MRI  [AG]   1725 Neurology consulted  [AG]      ED Course User Index  [AG] Aggie CosierGrozenski, Andrew, MD         MDM   Decide to obtain history from someone other than the patient: No    Decide to obtain previous medical records: No    Clinical Lab Test(s): Ordered and Reviewed    Diagnostic Tests (Radiology, EKG): Ordered and Reviewed    Independent Visualization (ED US, Wet Prep, Other): No    Discussed patient with NON-ED Provider: Admitting Team      ED Disposition   ED Disposition: Admit      ED Clinical Impression   ED Clinical Impression:   Altered mental status, unspecified altered mental status type  History of dementia  History of stroke  Weakness  Secondary hypertension      ED Patient Status   Patient Status:   Fair        ED Medical Evaluation Initiated   Medical Evaluation Initiated:  Yes, filed at 04/11/17 1415  by Aggie CosierGrozenski, Andrew, MD

## 2017-04-12 NOTE — ED Provider Notes
{  SH ED Lamonte SakaiJX PATIENT STATUS:[phone]}        ED Medical Evaluation Initiated   Medical Evaluation Initiated:  Yes, filed at 04/11/17 1415  by Aggie CosierGrozenski, Andrew, MD

## 2017-04-12 NOTE — Consults
Occupational Therapy Evaluation and Discharge      Start Time (min): 1050  End Time (min): 1102  Total Time (min): 12  Room/Bed: 421/421-01    Occupational Profile: [name] is a 72 y.o. male admitted on 04/11/2017 due to AMS.  Found to have new subacute finding Left frontal lobe.  Neuro workup to r/o vascular dementia, vs. CJD, vs. Lewy body dementia, vs. wernickes        ICD-10-CM ICD-9-CM   1. Altered mental status, unspecified altered mental status type R41.82 780.97   2. History of dementia Z86.59 V11.8   3. History of stroke Z86.73 V12.54   4. Weakness R53.1 780.79   5. Secondary hypertension I15.9 405.99       Precautions:  ? FALL    Extremity Precautions:  ? None indicated    Orthotic, Protective, & Supportive Devices:  ? None    PLOF: Pt transferred here from SNF setting.  Per family report, pt has been in/out of hospital and rehab since progressive decline started in JUly 2018.  Pt does have supportive sister and brother, however, they are unable to provide 24/7 or physical assistance.    Subjective: Pt very lethargic,only able to stay alert during brief periods     **Was an interpreter used: NA    Pain: no facial grimacing    Vitals: Vitals stable    Observations: supine, RA, condom cath, RN present  Vision: at baseline  Cognition: at baseline  Coordination: at baseline  Sensation: at baseline  UE ROM/Strength: at baseline    Bed Mobility: Supine to/from Sit: Moderate assistance (mod A)  Functional Transfers: NT    Balance:   ? Sitting: Static  Moderate Assist (Mod A)  ? Standing: Not Tested    ADLs: baseline level, Max A    Outcome Measures:  ? AM-PAC "6-clicks" Short Form: Raw Score: 6. AM-PAC Score: 17.07. CMS Score: 100.00% - CN    Treatment on Evaluation   ? None     Post Treatment:   Patient Position/safety: Bed alarm on, Call Bell within reach, Tray table within reach, All lines and leads intact, Handoff to nurse on patient position

## 2017-04-13 DIAGNOSIS — Z955 Presence of coronary angioplasty implant and graft: Secondary | ICD-10-CM

## 2017-04-13 DIAGNOSIS — F0151 Vascular dementia with behavioral disturbance: Principal | ICD-10-CM

## 2017-04-13 DIAGNOSIS — Z87891 Personal history of nicotine dependence: Secondary | ICD-10-CM

## 2017-04-13 DIAGNOSIS — R339 Retention of urine, unspecified: Secondary | ICD-10-CM

## 2017-04-13 DIAGNOSIS — I110 Hypertensive heart disease with heart failure: Secondary | ICD-10-CM

## 2017-04-13 DIAGNOSIS — R269 Unspecified abnormalities of gait and mobility: Secondary | ICD-10-CM

## 2017-04-13 DIAGNOSIS — Z8673 Personal history of transient ischemic attack (TIA), and cerebral infarction without residual deficits: Secondary | ICD-10-CM

## 2017-04-13 DIAGNOSIS — I2510 Atherosclerotic heart disease of native coronary artery without angina pectoris: Secondary | ICD-10-CM

## 2017-04-13 DIAGNOSIS — E785 Hyperlipidemia, unspecified: Secondary | ICD-10-CM

## 2017-04-13 DIAGNOSIS — I739 Peripheral vascular disease, unspecified: Secondary | ICD-10-CM

## 2017-04-13 DIAGNOSIS — I4891 Unspecified atrial fibrillation: Secondary | ICD-10-CM

## 2017-04-13 DIAGNOSIS — I5042 Chronic combined systolic (congestive) and diastolic (congestive) heart failure: Secondary | ICD-10-CM

## 2017-04-13 DIAGNOSIS — I6521 Occlusion and stenosis of right carotid artery: Secondary | ICD-10-CM

## 2017-04-13 DIAGNOSIS — E512 Wernicke's encephalopathy: Secondary | ICD-10-CM

## 2017-04-13 DIAGNOSIS — I252 Old myocardial infarction: Secondary | ICD-10-CM

## 2017-04-13 DIAGNOSIS — F1021 Alcohol dependence, in remission: Secondary | ICD-10-CM

## 2017-04-13 MED ORDER — IOHEXOL 350 MG/ML IV SOLN SH
100 mL | Freq: Once | INTRAVENOUS | Status: CP
Start: 2017-04-13 — End: ?

## 2017-04-13 MED ORDER — SODIUM CHLORIDE 0.9% FOR FLUSHES
20-180 mL | INTRAVENOUS | Status: CP | PRN
Start: 2017-04-13 — End: ?

## 2017-04-13 MED ORDER — LIDOCAINE HCL (PF) 2 % IJ SOLN
Freq: Once | SUBCUTANEOUS | Status: CP
Start: 2017-04-13 — End: ?

## 2017-04-13 NOTE — Plan of Care
Problem: Risk for Injury R/T Falls  Goal: Prevent Falls    Intervention: Assess fall risk using appropriate scale  Fall precautions in place to prevent falls. Morse Fall Risk score 95. Yellow armband applied to patient. Bed in lowest position with wheels locked with 3/4 side rails up. Bed alarm activated. Reinforced importance of using call light when assistance in needed. Performing hourly rounding and will continue to monitor patient.

## 2017-04-13 NOTE — Plan of Care
Problem: Risk for Injury R/T Falls  Goal: Use a bed and/or chair alarm    Intervention: Fall precautions (yellow armband, signage posted, low bed, call bell within reach, three side rails on bed, bed lock, bed/chair alarm, walking aids, declutter room, etc.)  Patient fall risk assessed. Bed alarm in on position,wheels locked,in lowest position. 3 of 4 side rails up. Yellow fall risk band applied to patient, Fall risk precaution posted to door. Room well lit and free of clutter. Pt educated on fall risk precaution,verbalized understanding. Call bell within reach,will continue to monitor patient for safety.      Problem: Knowledge Deficit Stroke  Goal: Understand s/s stroke, risk factors for stroke, how to activate EMS, stroke specific medications, plan for follow up care    Intervention: Provide education re: signs/sx stroke  Pt verbalized understanding of teaching related to stroke s/s, needs reinforcement pt with AMS.

## 2017-04-13 NOTE — Plan of Care
Please ensure any consulting providers/services are made aware of the pending 14-3-3 test.This will ensure appropriate precautions and protocols are followed until the test results or CJD is ruled out otherwise.

## 2017-04-13 NOTE — Progress Notes
Procedure complete, pt tolerated well, pt must lie flat for 2 hours, pt up time is 1915, report to Marinol RN, pt taken to CT scan, then awaiting transport.

## 2017-04-13 NOTE — Progress Notes
Pt NPO at this time, verbalized understanding.

## 2017-04-13 NOTE — H&P
04/11/2017 356 140 - 440 thou/cu mm Final     CMP-  Sodium   Date Value Ref Range Status   04/11/2017 140 135 - 145 mmol/L Final     Potassium   Date Value Ref Range Status   04/11/2017 3.9 3.3 - 4.6 mmol/L Final     Chloride   Date Value Ref Range Status   04/11/2017 101 101 - 110 mmol/L Final     CO2   Date Value Ref Range Status   04/11/2017 26 21 - 29 mmol/L Final     Glucose   Date Value Ref Range Status   04/11/2017 124 (H) 71 - 99 mg/dL Final     Urea Nitrogen   Date Value Ref Range Status   04/11/2017 18 6 - 22 mg/dL Final     Creatinine   Date Value Ref Range Status   04/11/2017 1.17 0.67 - 1.17 mg/dL Final     Albumin   Date Value Ref Range Status   04/11/2017 4.1 3.8 - 4.9 g/dL Final     Total Bilirubin   Date Value Ref Range Status   04/11/2017 0.5 0.2 - 1.0 mg/dL Final     ALT   Date Value Ref Range Status   04/11/2017 36 10 - 42 IU/L Final     AST   Date Value Ref Range Status   04/11/2017 51 (H) 14 - 33 IU/L Final     EGFR   Date Value Ref Range Status   04/11/2017 >59 mL/min/1.73M2 Final     Comment:       Reference range: =>90 ml/min/1.73M2  eGFR estimates are unable to accurately differentiate levels of GFR above 60 ml/min/1.73M2.     INR-   INR   Date Value Ref Range Status   04/13/2017 0.9 0.9 - 1.1 Final       Exam:  Lungs: clear to auscultation bilaterally  Abdomen: Exam deferred (Non-contributory)   Extremities: Exam deferred (Non-contributory)  Cardiovascular: regular rate and rhythm    Pulses: Pulse Exam Deferred (Non-Contributory)    Planned Surgical Site: Low back    Assessment and Plan:     Impression: AMS    Plan: Diagnostic lumbar puncture        Lonia Chimera, PA-C  04/13/2017 2:51 PM

## 2017-04-13 NOTE — Progress Notes
Patient off the floor for LP

## 2017-04-13 NOTE — Op Note
DEPARTMENT OF RADIOLOGY   RADIOLOGY POST PROCEDURE PROGRESS NOTE     Practitioners Present: Mirian Moravis Meyer, MD and Farley LyMario Agrait, MD    Procedure: Lumbar puncture with opening pressures    Surgical Site: L3-L4    Contrast Administered: None    Medications Given During Procedure: 2% Lidocaine 4 mL SQ     Complications: None    Estimated Blood Loss: Minimal    Specimens Obtained:  Vial 1: 4ml, Vial 2: 4ml, Vial 3: 2 ml, Vial 4: 1ml    Preliminary Report: Successful lumbar puncture with opening pressure of 7 cmH20. Unable to obtain closing pressure since CSF stopped draining. Total sample of 11ml sent to lab for analysis.     Tamala SerMario Agrait Bertran  04/13/2017 5:19 PM

## 2017-04-13 NOTE — Progress Notes
Notified by radiology of CT showing L transverse thrombosis. Dr. Elodia Florenceosensweet made aware. Will continue to monitor.

## 2017-04-13 NOTE — Progress Notes
Patient back on floor from LP

## 2017-04-13 NOTE — Plan of Care
Problem: Airway  Goal: The patient's respiratory status is within normal limits according to ASPAN standards    Intervention: Uses appropriate equipment to monitor respiratory status  RN TO MONITOR AND MAINTAIN AIRWAY STATUS THROUGHOUT PROCEDURE. PT SPO2 IS  99%      Problem: Safety  Goal: To promote quality healthcare in a safe perianesthesia care setting while preventing injury,harm and reduce the risk of healthcare associated infections.  RN TO VERIFY PT NAME AND DOB PRIOR TO PROCEDURE.    Problem: Knowledge Deficit  Goal: To obtain pain management as quickly as possible, to reduce the patient's pain to a tolerable level, and reduce pre-op anxiety.  MD TO EXPLAIN AND OBTAIN CONSENT PRIOR TO PROCEDURE.    Problem: Pain Management  Goal: To obtain pain management as quickly as possible, to reduce the patient's pain to a tolerable level, and reduce pre-op anxiety.  RN TO MONITOR PAIN LEVEL THROUGHOUT PROCEDURE. PT PAIN AT THIS TIME IS none

## 2017-04-13 NOTE — H&P
?   heparin 5000 UNIT/ML injection 5,000 Units  5,000 Units Subcutaneous Q12H [facility] Lattie HawShooliz, Pouya, MD   5,000 Units at 04/12/17 2013   ? labetalol (NORMODYNE,TRANDATE) injection 10 mg  10 mg Intravenous Q4H PRN Lattie HawShooliz, Pouya, MD       ? lidocaine (PF) (XYLOCAINE) 2 % injection   Subcutaneous Once Thilges, Nathan, PA-C       ? lisinopril (PRINIVIL,ZESTRIL) tablet 40 mg  40 mg Oral BID Lattie HawShooliz, Pouya, MD   40 mg at 04/13/17 0949   ? metoprolol succinate (TOPROL-XL) 24 hr tablet 25 mg  25 mg Oral daily Lattie HawShooliz, Pouya, MD   25 mg at 04/13/17 0949   ? nicotine (NICODERM CQ) 21 MG/24HR 1 patch  1 patch Transdermal daily Lattie HawShooliz, Pouya, MD   1 patch at 04/13/17 1000   ? nicotine (NICODERM CQ) patch removal  1 patch Transdermal daily Lattie HawShooliz, Pouya, MD   1 each at 04/13/17 1000   ? NIFEdipine (PROCARDIA XL) 24 hr tablet 30 mg  30 mg Oral daily Lattie HawShooliz, Pouya, MD   30 mg at 04/13/17 0949   ? oxybutynin (DITROPAN) tablet 5 mg  5 mg Oral TID Lattie HawShooliz, Pouya, MD   5 mg at 04/13/17 0949   ? perflutren lipid microspheres (DEFINITY) injection 0.2-1.3 mL  0.2-1.3 mL Intravenous PRN Lattie HawShooliz, Pouya, MD       ? thiamine (VITAMIN B-1) 100 mg, folic acid 1 mg in 0.9 % NaCl 100 mL IVPB   Intravenous daily Lattie HawShooliz, Pouya, MD       ? ticagrelor (BRILINTA) tablet 90 mg  90 mg Oral BID Lattie HawShooliz, Pouya, MD   90 mg at 04/13/17 0949   ? traZODone (DESYREL) tablet 50 mg  50 mg Oral Nightly Lattie HawShooliz, Pouya, MD   50 mg at 04/12/17 2016        Allergies: No Known Allergies    Objective:     Vital Signs:BP: 142/72 (01/14 1133),  Pulse: 68 (01/14 1133), Resp: 17 (01/14 1133), SpO2: 99 % (01/14 1133), Temp: 36.7 ?C (98 ?F) (01/14 1133)    Labs:   CBC-   WBC   Date Value Ref Range Status   04/11/2017 7.11 4.5 - 11 x10E3/uL Final     Hemoglobin   Date Value Ref Range Status   04/11/2017 11.2 (L) 14.0 - 18.0 g/dL Final     Hematocrit   Date Value Ref Range Status   04/11/2017 35.5 (L) 40.0 - 54.0 % Final     Platelet Count   Date Value Ref Range Status

## 2017-04-13 NOTE — H&P
Department of Radiology  Special Procedures   Pre-Procedure History & Physical      Subjective:     Chief Complaint: AMS  Chief Complaint   Patient presents with   ? Altered Mental Status       History of Present Illness: Jon Meza is a 72 y/o male who presents today for a diagnostic lumbar puncture.    Past Medical History:   Past Medical History:   Diagnosis Date   ? CAD (coronary artery disease)    ? Cerebral artery occlusion with cerebral infarction (CMS-HCC code)    ? Dementia    ? Fall    ? Hyperlipidemia    ? Hypertension    ? MI, old    ? PVD (peripheral vascular disease) (CMS-HCC code)        Past Surgical History:   Past Surgical History:   Procedure Laterality Date   ? STENT BMS - CORONARY     ? STENT DES - PERIPHERAL         Social History:   Social History   Substance Use Topics   ? Smoking status: Former Smoker     Quit date: 03/21/2017   ? Smokeless tobacco: Not on file   ? Alcohol use No      Comment: NOTHING IN 4 MONTHS        Family History: No family history on file.    Medications:   Current Facility-Administered Medications   Medication Dose Route Frequency Provider Last Rate Last Dose   ? acetaminophen (TYLENOL) tablet 650 mg  650 mg Oral Q4H PRN Lattie HawShooliz, Pouya, MD       ? aspirin chewable tablet 81 mg  81 mg Oral daily Lattie HawShooliz, Pouya, MD   81 mg at 04/12/17 0850   ? atorvastatin (LIPITOR) tablet 80 mg  80 mg Oral Nightly Lattie HawShooliz, Pouya, MD   80 mg at 04/12/17 2014   ? dextrose 50 % injection 15 g  30 mL Intravenous PRN Lattie HawShooliz, Pouya, MD       ? docusate sodium (COLACE) capsule 100 mg  100 mg Oral daily Lattie HawShooliz, Pouya, MD   100 mg at 04/13/17 0949   ? gabapentin (NEURONTIN) capsule 200 mg  200 mg Oral TID Lattie HawShooliz, Pouya, MD   200 mg at 04/13/17 0949   ? glucose chewable tablet 16 g  16 g Oral PRN Lattie HawShooliz, Pouya, MD       ? haloperidol lactate (HALDOL) injection 5 mg  5 mg Intravenous Q6H PRN Zebedee Ibaobinson, John Floyd, MD   5 mg at 04/13/17 0340

## 2017-04-14 NOTE — Progress Notes
Severe degenerative disease of the cervical spine with severe spinal canal stenosis and neuroforaminal narrowing at C4-C5 and C5-C6.     Mri Brain W/o & W Con - Result Date: 04/12/2017  Impression:  Small punctate area of subacute ischemic change in the left frontal subcortical white matter. No evidence for large vascular territory stroke. Advanced scattered and confluent nonspecific periventricular and subcortical white matter disease most likely reflecting small vessel chronic ischemic change. Findings are greater than expected for chronologic age. Correlation for history of chronic hypertension, diabetes, and/or  vasculitis is recommended. Additional multifocal areas of encephalomalacia, as above. Mammillary body and superior vermian atrophy which may be seen in the setting of chronic EtOH.     Ct Head W/o Iv Con - Result Date: 04/11/2017  Asymmetry of the left temporal horn in comparison to the right with subtle hypoattenuated appearance of the adjacent parenchyma; in the appropriate clinical context, an acute on chronic  ischemic infarct would be among the considerations. Consider obtaining brain MRI without contrast to further evaluate. No acute intracranial hemorrhage. Extensive diffuse cerebral volume loss with ex vacuo dilatation of the ventricular system with extensive periventricular, subcortical white matter disease likely secondary to chronic microvascular disease. Intracranial atherosclerosis. Encephalomalacia of the bilateral MCA territory, left greater than right as well as left frontal lobe as described above consistent with remote ischemic infarcts     Xr Chest Single View - Result Date: 04/11/2017  Impression: Left basilar subsegmental atelectasis vs. pulmonary scarring. Aortic tortuosity.        Cardio:  Echo-TTE Complete - 04/11/2017  Conclusion  View: This was a technically difficult study. No true parasternal window.  Left Ventricle:

## 2017-04-14 NOTE — Progress Notes
CT Angiography Extracranial/Cervical Clinical indication:Stroke Comparison:None Technique: CT angiographic images were obtained of the cervical carotid arteries from the arch to the skull base following intravenous contrast administration using rapid image acquisition parameters. The images include the vertebral arteries in their cervical segments within the transverse foramen. The images are reviewed in multiplanar projections. 3-D MIP images are provided and reviewed. Vessel stenosis of the cervical internal carotid is evaluated and reported using the transverse  diameter of the normal distal internal carotid as the denominator for stenosis measurement. The stenosis is reported as a percent based upon this standard accepted criteria.   Findings: The visualized portion of the aortic arch is unremarkable.  Primary branches of the aortic arch including the brachiocephalic, left common carotid, and left subclavian demonstrate normal contrast enhancement without significant stenotic lesion or aneurysm.  There is arthrosclerotic disease visualized of the bilateral proximal ICAs and bulb region. There is approximately 50% luminal narrowing noted of the distal right ICA (18/452). The course of the bilateral vertebral arteries is unremarkable with normal contrast enhancement  without stenotic lesion, aneurysm, focal vascular occlusion, or dissection. No abnormal vessels are apparent. There is severe arthrosclerotic disease visualized of the cervical spine including marginal osteophytes, uncovertebral joint hypertrophy, facet arthropathy. Severe central spinal canal stenosis visualized at C4-C5 and C5-C6 with severe bilateral neuroforaminal stenosis. The soft tissues of the neck are unremarkable. The visualized lung apices demonstrate paraseptal emphysema. There is a filling defect within the anterior aspect of the trachea which may represent mucous   Conclusion: Approximately, 50% luminal narrowing of the distal right ICA.

## 2017-04-14 NOTE — Procedures
PATIENT NAME:  Jon BlightJAMES Lingerfelt  MRN:   [id number]  DATE OF SERVICE:  04/13/2017    REFERRING PHYSICIAN:  Dr. Barnetta ChapelShooliz     LOCATIONS:  Inpatient.    DESCRIPTION:  The study shows diffuse polymorphic moderate amplitude alpha and beta, occasional theta.  This pattern persists throughout the recording.  Photic stimulation, no driving responses were seen.  Hyperventilation could not be performed.  There were no epileptiform or electrographic seizures seen.    MEDICATIONS:  Lipitor, Neurontin, heparin, Toprol.    HISTORY:  Dementia.    CLASSIFICATION:  Diffuse slowing with beta.    IMPRESSION:  The study shows the above-mentioned finding indicating global encephalopathy that may be secondary to medication or metabolic causes.  There were no epileptiform or electrographic seizures seen.      Georgiann Mccoyamon E Bautista, MD    RB/NTS  DD:  04/13/2017 11:25:49  DT:  04/14/2017 07:56:37  Job#:  [id number]  Conf#:  [id number]

## 2017-04-14 NOTE — Progress Notes
Hamstrings (L5, S1; sciatic n.) 5/5 5/5       Tibialis anterior (L4, L5; deep peroneal n.) 5/5 5/5       Gastrocnemius (S1, S2; tibial n.) 5/5 5/5     Reflexes:  Reflexes Right Left       Biceps (C5, C6; Musculocutaneous n.) 2 2       Brachioradialis (C5, C6; Radial n.) 2 2       Triceps (C7, C8; Radial n.) 2 2       Patellar (L3, L4; Femoral n.) 2 2       Achilles (S1, S2; Tibial n.) 1 1       Hoffman response (UMN sign) N/A N/A       Plantar response (UMN sign) Down Down     Sensory:  intact to light touch, pin prick and vibration in all 4 extremities  Coordination:  Mild tremors present globally with intentional movement  Gait & Station:  Deferred     Labs:    Recent Labs      04/11/17   1444   WBC  7.11   HGB  11.2*   HCT  35.5*   MCV  89.9   NEUTROPCT  49.3       Recent Labs      04/11/17   1443   NA  140   K  3.9   CL  101   CO2  26   BUN  18   CREATININE  1.17   GLU  124*   CALCIUM  9.2   MG  2.1       Recent Labs      04/11/17   1443  04/11/17   2050   ALB  4.2  4.1   ALKPHOS  83  82   ALT  37  36   AST  53*  51*   DBILI  0.1  0.1   TBILI  0.4  0.5   TPROT  8.0  7.9       Recent Labs      04/13/17   0740   INR  0.9       Lab Results   Component Value Date/Time    CHOL 125 04/12/2017 06:07 AM    TRIG 85 04/12/2017 06:07 AM    HDL 31 (L) 04/12/2017 06:07 AM    LDL 77 04/12/2017 06:07 AM       Lab Results   Component Value Date    TSH 1.860 04/11/2017       Lab Results   Component Value Date/Time    HGBA1C 5.6 04/12/2017 06:07 AM         Imaging:  CTV Head W/o & W Iv Con - Result Date: 04/13/2017  No evidence of sinus thrombosis.    Cta Head and Neck W/o & W Iv Con - Result Date: 04/12/2017  Nonvisualization of the left frontal subcortical punctate subacute ischemic infarct best visualized on the most recent MRI of the brain. No evidence of hemorrhagic transformation Chronic findings as described above. Normal CTA evaluation of the circle of Willis.

## 2017-04-14 NOTE — Progress Notes
Progress Note  Department of Neurology     Admit Date: 04/11/2017   LOS: 3 days   PCP: No primary care provider on file.    Hospital Course     [name]?is a 72 y.o.?male?w/ a PMH of alcoholism, dementia (likely vascular), x3 strokes, [facility] admission [facility] for same complaint with discharge to nursing home?who presented on 04/11/2017 ?1:55 PM?for evaluation of AMS and agitation.     Subjective     In the past 24 hours, Mr. Jon Meza has experienced no acute events. Pt tollerated LP well yesterday, procedure completed without complication. CSF studies without sign of significant derangement, no signs of infection, sendout labs pending. CTV negative for left transverse thrombosis. Pt remains confused but redirectable.     ROS: A comprehensive 14 point review was negative except as stated above.  Objective       Vital Signs: Last Filed Vitals Signs: 24 Hour Range   BP: 125/73 (01/15 0657) BP: (104-152)/(63-94)    Temp: 35.9 ?C (96.7 ?F) (01/15 16100657) Temp:  [35.9 ?C (96.7 ?F)-36.7 ?C (98 ?F)]    Pulse: 55 (01/15 0657) Pulse:  [55-111]    Resp: 16 (01/15 0657) Resp:  [15-24]    SpO2: 98 % (01/15 0657) SpO2:  [91 %-100 %]    Respiratory Device: Room Air / (None)            Intake/Output Summary (Last 24 hours) at 04/14/17 0940  Last data filed at 04/13/17 2310   Gross per 24 hour   Intake                0 ml   Output              600 ml   Net             -600 ml       SCHEDULED MEDS:  ? aspirin  81 mg Oral daily   ? atorvastatin  80 mg Oral Nightly   ? docusate sodium  100 mg Oral daily   ? gabapentin  200 mg Oral TID   ? heparin  5,000 [facility]   ? lisinopril  40 mg Oral BID   ? metoprolol succinate  25 mg Oral daily   ? nicotine  1 patch Transdermal daily   ? nicotine  1 patch Transdermal daily   ? NIFEdipine  30 mg Oral daily   ? oxybutynin  5 mg Oral TID   ? ticagrelor  90 mg Oral BID   ? traZODone  50 mg Oral Nightly     IV INFUSIONS:    PRN MEDS:

## 2017-04-14 NOTE — Progress Notes
Progress Note  Department of Neurology     Admit Date: 04/11/2017   LOS: 2 days   PCP: No primary care provider on file.    Hospital Course     Jon Mezais a 72 y.o.?male?w/ a PMH of alcoholism, dementia (likely vascular), x3 strokes, [facility] for same complaint with discharge to nursing home?who presented on 04/11/2017 ?1:55 PM?for evaluation of AMS and agitation.     Subjective     In the past 24 hours, Jon Meza has experienced no acute events. Pt to IR for LP today.    ROS: A comprehensive 14 point review was negative except as stated above.  Objective       Vital Signs: Last Filed Vitals Signs: 24 Hour Range   BP: 154/78 (01/14 0751) BP: (128-154)/(70-82)    Temp: 36.7 ?C (98 ?F) (01/14 0751) Temp:  [36.3 ?C (97.3 ?F)-36.7 ?C (98 ?F)]    Pulse: 70 (01/14 0949) Pulse:  [48-70]    Resp: 18 (01/14 0751) Resp:  [17-18]    SpO2: 99 % (01/14 0751) SpO2:  [98 %-99 %]    Respiratory Device: Room Air / (None)            Intake/Output Summary (Last 24 hours) at 04/13/17 1043  Last data filed at 04/12/17 1800   Gross per 24 hour   Intake                0 ml   Output              350 ml   Net             -350 ml       SCHEDULED MEDS:  ? aspirin  81 mg Oral daily   ? atorvastatin  80 mg Oral Nightly   ? docusate sodium  100 mg Oral daily   ? gabapentin  200 mg Oral TID   ? heparin  5,000 [facility]   ? lisinopril  40 mg Oral BID   ? metoprolol succinate  25 mg Oral daily   ? nicotine  1 patch Transdermal daily   ? nicotine  1 patch Transdermal daily   ? NIFEdipine  30 mg Oral daily   ? oxybutynin  5 mg Oral TID   ? thiamine - folic acid IVPB   Intravenous daily   ? ticagrelor  90 mg Oral BID   ? traZODone  50 mg Oral Nightly     IV INFUSIONS:    PRN MEDS:    acetaminophen 650 mg Oral Q4H PRN   dextrose 30 mL Intravenous PRN   glucose 16 g Oral PRN   haloperidol lactate 5 mg Intravenous Q6H PRN   labetalol 10 mg Intravenous Q4H PRN

## 2017-04-14 NOTE — Progress Notes
Global systolic function: Left ventricular systolic function is mildly decreased. Ejection fraction is 45 - 50 %.  Regional systolic function: Wall motion: inferolateral wall is akinetic. Basal to mid inferior wall is hypokinetic.  Right Ventricle: Right ventricle is not well visualized.  Left Atrium: The left atrium is mildly enlarged.  Aortic Valve: There is mild aortic regurgitation.  Tricuspid Valve: PA systolic pressure cannot be estimated due to poor tricuspid regurgitant envelope.  Systemic Veins: IVC is normal with less than 50% collapse during inspiration suggestive of right atrial pressure of 8 mmHg. TEE would be a more sensitive test to evaluate for any cardiac source of embolism, if clinically appropriate. Clinical correlation recommended.    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   ?  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 Etiology from other causes may include CJD (taking into consideration the reported acute decline), Lewy body dementia given his hallucinations. Malignancy and metastasis also on differential given his significant smoking history. ?    -MRI Brain - Small punctate area of subacute ischemic change in the left frontal subcortical white matter. No evidence for large vascular territory stroke - as above.  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  - Initiate antiplatelet? Started home dose of ASA 81 + Brilinta 90mg  BID.

## 2017-04-14 NOTE — Coding Query
In order to improve the specificity and completeness of the data used to assign diagnosis and procedure codes, we need your assistance.    Mr. Marinell BlightJames Pelzel is a 72 y.o. male with a PMH of alcoholism, dementia likely vascular, 3 strokes who presented for evaluation of AMS and agitation. Admitted 04/11/2017 at 1959.    Clinical indicators which support this query include:     04/13/2017 Echo:"...  Global systolic function:  Left ventricular systolic function is mildly decreased. Ejection fraction is 45 - 50 %.  Regional systolic function:  Wall motion: inferolateral wall is akinetic. Basal to mid inferior wall is hypokinetic.  Diastolic profile is suggestive of pseudonormal LV filling pattern...  Right ventricle is not well visualized.  The left atrium is mildly enlarged...  The right atrial size is normal..."    MAR: metoprolol 25 mg PO daily, lisinopril 40 mg PO BID, labetalol 10 mg IV PRN x 1  Procardia XL daily    PHYSICIAN RESPONSE NEEDED BELOW, (please type "X" on any which apply):    1. Based on these indications and the documentation in the medical record, please indicate if the patient has/had during this admission:   _________ Diastolic heart failure (HFpEF)  _________ Combined Systolic/Diastolic (HFrEF/HFpEF)  _________ Patient does not have heart failure  _________ Other, please specify _________    2. If heart failure was selected from above, please indicate the acuity:   _________ Acute  _________ Chronic  _________ Acute on chronic  _________ Other, please specify _________    Thank you for your clarification.

## 2017-04-14 NOTE — Coding Query
In order to improve the specificity and completeness of the data used to assign diagnosis and procedure codes, we need your assistance.    Mr. Jon Meza is a 72 y.o. male with a PMH of alcoholism, dementia likely vascular, 3 strokes who presented for evaluation of AMS and agitation. Admitted 04/11/2017 at 1959.    Clinical indicators which support this query include:     04/13/2017 Echo:"...  Global systolic function:  Left ventricular systolic function is mildly decreased. Ejection fraction is 45 - 50 %.  Regional systolic function:  Wall motion: inferolateral wall is akinetic. Basal to mid inferior wall is hypokinetic.  Diastolic profile is suggestive of pseudonormal LV filling pattern...  Right ventricle is not well visualized.  The left atrium is mildly enlarged...  The right atrial size is normal..."    MAR: metoprolol 25 mg PO daily, lisinopril 40 mg PO BID, labetalol 10 mg IV PRN x 1  Procardia XL daily    PHYSICIAN RESPONSE NEEDED BELOW, (please type "X" on any which apply):    1. Based on these indications and the documentation in the medical record, please indicate if the patient has/had during this admission:   _________ Diastolic heart failure (HFpEF)  ___X______ Combined Systolic/Diastolic (HFrEF/HFpEF)  _________ Patient does not have heart failure  _________ Other, please specify _________    2. If heart failure was selected from above, please indicate the acuity:   _________ Acute  ______X___ Chronic  _________ Acute on chronic  _________ Other, please specify _________    Thank you for your clarification.

## 2017-04-14 NOTE — Progress Notes
Pt awake with hyperactive, but pleasant behavior all night, trying to get oob frequently and pulling off blankets, condom caths, and telemetry. Haldol given with evening meds which made the hyperactivity more slightly manageable. Bed alarm engaged in  Zone 2 with SR upx3 and bed low/locked. Pt does not use call bell due to AMS.

## 2017-04-14 NOTE — Progress Notes
frontal subcortical white matter. No evidence for large vascular territory stroke. Advanced scattered and confluent nonspecific periventricular and subcortical white matter disease most likely reflecting small vessel chronic ischemic change. Findings are greater than expected for chronologic age. Correlation for history of chronic hypertension, diabetes, and/or  vasculitis is recommended. Additional multifocal areas of encephalomalacia, as above. Mammillary body and superior vermian atrophy which may be seen in the setting of chronic EtOH.     Ct Head W/o Iv Con - Result Date: 04/11/2017  Asymmetry of the left temporal horn in comparison to the right with subtle hypoattenuated appearance of the adjacent parenchyma; in the appropriate clinical context, an acute on chronic  ischemic infarct would be among the considerations. Consider obtaining brain MRI without contrast to further evaluate. No acute intracranial hemorrhage. Extensive diffuse cerebral volume loss with ex vacuo dilatation of the ventricular system with extensive periventricular, subcortical white matter disease likely secondary to chronic microvascular disease. Intracranial atherosclerosis. Encephalomalacia of the bilateral MCA territory, left greater than right as well as left frontal lobe as described above consistent with remote ischemic infarcts     Xr Chest Single View - Result Date: 04/11/2017  Impression: Left basilar subsegmental atelectasis vs. pulmonary scarring. Aortic tortuosity.        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   ?  Concern for new acute ischemic stroke in left temporal horn region as seen by a hypodensity in head CT. Differential also includes vascular dementia,

## 2017-04-14 NOTE — Progress Notes
acetaminophen 650 mg Oral Q4H PRN   dextrose 30 mL Intravenous PRN   glucose 16 g Oral PRN   haloperidol lactate 5 mg Intravenous Q6H PRN   labetalol 10 mg Intravenous Q4H PRN   perflutren lipid microspheres 0.2-1.3 mL Intravenous PRN       Diet: Diet NPO time specified  Activity: Out of bed with assistance    Physical Exam:  VS: BP 125/73  - Pulse 55  - Temp 35.9 ?C (96.7 ?F) (Axillary)  - Resp 16  - Ht 1.778 m (5\' 10" )  - Wt 86.2 kg (190 lb)  - SpO2 98%  - BMI 27.26 kg/m?     General:  Alert, cooperative, no apparent distress, appears stated age  Head:  Normocephalic, without obvious abnormality  Eyes:  Conjunctivae and corneas clear  Neck:  Supple, symmetrical, trachea midline   Lungs:  Clear to auscultation bilaterally, non-labored  CV:  Regular rhythm, S1, S2     Neurologic:  Mental status:  Awake; Alert to person only; language including expression & comprehension was assessed and found intact     Cranial nerves:   I:  Not tested  II, III, IV, VI:  PERRL, normal consensual response; EOMI; no ptosis, no conjugate or asymmetric nystagmus  V 1/2/3:  Sensation is intact on forehead, cheeks, and jaw region; mastication intact  VII:  No facial droop; facial symmetry while smiling & wrinkling of forehead; tight lid closure  VIII:  Able to hear throughout the history taking process  IX & X:  Symmetric palatal elevation  XI:  Normal jaw and shoulder strength against resistance B/L  XII:  Tongue is symmetric & midline with no atrophy or fasciculations  Speech:  Normal    Motor:    Bulk/Tone:  Normal bulk, normal tone; No drift  Motor Strength Right Left       Deltoid (C5, C6; axillary n.) 5/5 5/5       Biceps (C6, C6; musculocutaneous n.) 5/5 5/5       Triceps (C6, C7, C8; radial n.) 5/5 5/5       Wrist extensors (C6, C7; radial n.) 5/5 5/5       Grip (C8; median & ulnar n.) 5/5 5/5       Iliopsoas (L2, L3, L4; femoral n.) 5/5 5/5       Quadriceps femoris (L3, L4; femoral n.) 5/5 5/5

## 2017-04-14 NOTE — Progress Notes
perflutren lipid microspheres 0.2-1.3 mL Intravenous PRN       Diet: Diet NPO time specified  Activity: Out of bed with assistance    Physical Exam:  VS: BP 154/78  - Pulse 70  - Temp 36.7 ?C (98 ?F) (Oral)  - Resp 18  - Ht 1.778 m (5\' 10" )  - Wt 86.2 kg (190 lb)  - SpO2 99%  - BMI 27.26 kg/m?     General:  Alert, cooperative, no apparent distress, appears stated age  Head:  Normocephalic, without obvious abnormality  Eyes:  Conjunctivae and corneas clear  Neck:  Supple, symmetrical, trachea midline   Lungs:  Clear to auscultation bilaterally, non-labored  CV:  Regular rhythm, S1, S2     Neurologic:  Mental status:  Awake; Alert to person only; language including expression & comprehension was assessed and found intact     Cranial nerves:   I:  Not tested  II, III, IV, VI:  PERRL, normal consensual response; EOMI; no ptosis, no conjugate or asymmetric nystagmus  V 1/2/3:  Sensation is intact on forehead, cheeks, and jaw region; mastication intact  VII:  No facial droop; facial symmetry while smiling & wrinkling of forehead; tight lid closure  VIII:  Able to hear throughout the history taking process  IX & X:  Symmetric palatal elevation  XI:  Normal jaw and shoulder strength against resistance B/L  XII:  Tongue is symmetric & midline with no atrophy or fasciculations  Speech:  Normal    Motor:    Bulk/Tone:  Normal bulk, normal tone; No drift  Motor Strength Right Left       Deltoid (C5, C6; axillary n.) 5/5 5/5       Biceps (C6, C6; musculocutaneous n.) 5/5 5/5       Triceps (C6, C7, C8; radial n.) 5/5 5/5       Wrist extensors (C6, C7; radial n.) 5/5 5/5       Grip (C8; median & ulnar n.) 5/5 5/5       Iliopsoas (L2, L3, L4; femoral n.) 5/5 5/5       Quadriceps femoris (L3, L4; femoral n.) 5/5 5/5       Hamstrings (L5, S1; sciatic n.) 5/5 5/5       Tibialis anterior (L4, L5; deep peroneal n.) 5/5 5/5       Gastrocnemius (S1, S2; tibial n.) 5/5 5/5     Reflexes:  Reflexes Right Left

## 2017-04-14 NOTE — Progress Notes
Department of Case Management  Discharge Planning Progress Note       NAME: Jon Meza    MRN: [id number]    AGE: 72 y.o. DOB: 28-Feb-1946 Date of Admission: 04/11/2017    Payor: Payor: [place] / Plan: [employer] / Product Type: HMO/POS /     Advance Directive/Healthcare Decision Maker: Advance Directive/Healthcare Decision Maker: Next of Kin (NOK) Proxy (comment) (04/12/17 1556)  Legal Documentation:      Current Medical Status: Patient is medically stable; sitter has been d/c.  Must be sitter free for 24 hours to transfer to SNF    The patient is currently: inpatient    Case Manager Assessment/Actions Taken: Case manager met with the patient's sister to discuss discharge planning.  The patient's sisiter would like for him to return to [facility].  They state that the patient was there for skilled rehab and in the beginning process of completing his long term Medicaid application.  Case manager submitted a referral to [facility].  The facility states they are able to accept the patient and have requested case manager seek to obtain authorization from the insurance provider for the patient to transfer as a skilled patient.  Case manager faxed the patient's clinicals to [employer].    Potential barriers and planned intervention: A potential barrier may be that the patient's insurance provider does not provide an authorization for skilled services.  If this happens the patient will have to discharge to the facility as a Medicaid pending, pending the facilities willingness to accept the patient as a medicaid pending.      Interdisciplinary updates: No interdisciplinary updates    Anticipated discharge plan: [facility]    Expected discharge date: 04/15/2017    Maryfrances Bunnell    04/14/2017 1:59 PM

## 2017-04-14 NOTE — Progress Notes
-   Echo-TTE Complete - multiple areas of hypokinesis, EF=45-50%  - Telemetry to monitor for a-fib  - HgbA1c 5.6   - Labs: LFTs (51/36), Lipids wnl no need for statin, TSH wnl (1.8)  - Infective: UA negative, WBC wnl (7.11)  - Toxicology neg for Acetaminophen, Ethyl EtOh, Salicylate  - Passed swallow screen  - PHQ2 Depression screening before discharge if patient is positive for stroke  - Stroke Education (ordered in epic)   - PT/OT following - recs for Long Term Care  - Vitals / neuro checks q4 hours  - F/u rEEG to search for signs of CJD  - Started on home dose of Trazodone to assist with aggitation  - Pt for LP (therapeutic + diagnostic w CSF studies) via IR    - Lab and IR made aware of CJD precautions   - Labs: cell count, diff, Cx w Gram stain - no significant derangements    - F/u 14-3-3 Protein, HSV 1/2, NMDA Ab, total protein, glucose,    HTN  - Well controlled on home meds  - Restarted home doses: Lisinopril 40mg  BID, Toprol 25mg  QD, Nifedipine 30mg  QD      PPX: Brilinta for A-fib  Code status: Full Code  Contact:  Primary Emergency ContactTyrone Sage: NADEL Buczynski, Home Phone: [phone]  Dispo: LTC upon completion of work up      Marijean HeathZachery Rosensweet, DO  Department of Neurology, PGY-2  [facility]   9:40 AM; 04/14/2017  Pager: [phone]

## 2017-04-14 NOTE — Progress Notes
parameters. The images include the vertebral arteries in their cervical segments within the transverse foramen. The images are reviewed in multiplanar projections. 3-D MIP images are provided and reviewed. Vessel stenosis of the cervical internal carotid is evaluated and reported using the transverse  diameter of the normal distal internal carotid as the denominator for stenosis measurement. The stenosis is reported as a percent based upon this standard accepted criteria.   Findings: The visualized portion of the aortic arch is unremarkable.  Primary branches of the aortic arch including the brachiocephalic, left common carotid, and left subclavian demonstrate normal contrast enhancement without significant stenotic lesion or aneurysm.  There is arthrosclerotic disease visualized of the bilateral proximal ICAs and bulb region. There is approximately 50% luminal narrowing noted of the distal right ICA (18/452). The course of the bilateral vertebral arteries is unremarkable with normal contrast enhancement  without stenotic lesion, aneurysm, focal vascular occlusion, or dissection. No abnormal vessels are apparent. There is severe arthrosclerotic disease visualized of the cervical spine including marginal osteophytes, uncovertebral joint hypertrophy, facet arthropathy. Severe central spinal canal stenosis visualized at C4-C5 and C5-C6 with severe bilateral neuroforaminal stenosis. The soft tissues of the neck are unremarkable. The visualized lung apices demonstrate paraseptal emphysema. There is a filling defect within the anterior aspect of the trachea which may represent mucous   Conclusion: Approximately, 50% luminal narrowing of the distal right ICA. Severe degenerative disease of the cervical spine with severe spinal canal stenosis and neuroforaminal narrowing at C4-C5 and C5-C6.     Mri Brain W/o & W Con - Result Date: 04/12/2017  Impression:  Small punctate area of subacute ischemic change in the left

## 2017-04-14 NOTE — Progress Notes
Marijean HeathZachery Rosensweet, DO  Department of Neurology, PGY-2  [facility]   10:43 AM; 04/13/2017  Pager: [phone]

## 2017-04-14 NOTE — Progress Notes
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 Other dementias such as Lewy body dementia is also present given his hallucinations. Wernicke's or korsakoff psychosis also on differential due to his history of alcoholism. Normal pressure hydrocephalus also in differential due to triad of gait issues + urinary retention + encephalopathy. Malignancy and metastasis also on differential given his significant smoking history. ?    -MRI Brain - Small punctate area of subacute ischemic change in the left frontal subcortical white matter. No evidence for large vascular territory stroke - as above.  - CTA Head and Neck - showing approximately 50% luminal narrowing of the distal right ICA - as above  - Vit B12 wnl, Folate wnl, started on Thiamin IV for Wernicke's/Korsakoff.  - Initiate antiplatelet? Started home dose of ASA 81 + Brilinta 90mg  BID.  - Echo-TTE Complete - pending read  - Telemetry to monitor for a-fib  - HgbA1c 5.6   - Labs: LFTs (51/36), Lipids wnl no need for statin, TSH wnl (1.8)  - Infective: UA negative, WBC wnl (7.11)  - Toxicology neg for Acetaminophen, Ethyl EtOh, Salicylate  - Passed swallow screen  - PHQ2 Depression screening before discharge if patient is positive for stroke  - Stroke Education (ordered in epic)   - PT/OT following - recs for Long Term Care  - Vitals / neuro checks q4 hours  - F/u rEEG to search for signs of CJD  - Started on home dose of Trazodone to assist with aggitation  - Pt for LP (therapeutic + diagnostic w CSF studies) via IR    - Lab and IR made aware of CJD precautions   - Labs: 14-3-3 Protein, HSV 1/2, NMDA Ab, cell count, diff, total protein, glucose, Cx w Gram stain    HTN  - Well controlled on home meds  - Restarted home doses: Lisinopril 40mg  BID, Toprol 25mg  QD, Nifedipine 30mg  QD      PPX: Brilinta for A-fib  Code status: Full Code  Contact:  Primary Emergency ContactTyrone Sage: Jon Meza, Home Phone: [phone]  Dispo: LTC upon completion of work up

## 2017-04-14 NOTE — Progress Notes
Biceps (C5, C6; Musculocutaneous n.) 2 2       Brachioradialis (C5, C6; Radial n.) 2 2       Triceps (C7, C8; Radial n.) 2 2       Patellar (L3, L4; Femoral n.) 2 2       Achilles (S1, S2; Tibial n.) 1 1       Hoffman response (UMN sign) N/A N/A       Plantar response (UMN sign) Down Down     Sensory:  intact to light touch, pin prick and vibration in all 4 extremities  Coordination:  Mild tremors present globally with intentional movement  Gait & Station:  Deferred     Labs:    Recent Labs      04/11/17   1444   WBC  7.11   HGB  11.2*   HCT  35.5*   MCV  89.9   NEUTROPCT  49.3       Recent Labs      04/11/17   1443   NA  140   K  3.9   CL  101   CO2  26   BUN  18   CREATININE  1.17   GLU  124*   CALCIUM  9.2   MG  2.1       Recent Labs      04/11/17   1443  04/11/17   2050   ALB  4.2  4.1   ALKPHOS  83  82   ALT  37  36   AST  53*  51*   DBILI  0.1  0.1   TBILI  0.4  0.5   TPROT  8.0  7.9       Recent Labs      04/13/17   0740   INR  0.9       Lab Results   Component Value Date/Time    CHOL 125 04/12/2017 06:07 AM    TRIG 85 04/12/2017 06:07 AM    HDL 31 (L) 04/12/2017 06:07 AM    LDL 77 04/12/2017 06:07 AM       Lab Results   Component Value Date    TSH 1.860 04/11/2017       Lab Results   Component Value Date/Time    HGBA1C 5.6 04/12/2017 06:07 AM         Imaging:  Cta Head and Neck W/o & W Iv Con - Result Date: 04/12/2017  Nonvisualization of the left frontal subcortical punctate subacute ischemic infarct best visualized on the most recent MRI of the brain. No evidence of hemorrhagic transformation Chronic findings as described above. Normal CTA evaluation of the circle of Willis.  CT Angiography Extracranial/Cervical Clinical indication:Stroke Comparison:None Technique: CT angiographic images were obtained of the cervical carotid arteries from the arch to the skull base following intravenous contrast administration using rapid image acquisition

## 2017-04-15 NOTE — Discharge Summary
course of the bilateral vertebral arteries is unremarkable with normal contrast enhancement  without stenotic lesion, aneurysm, focal vascular occlusion, or dissection. No abnormal vessels are apparent. There is severe arthrosclerotic disease visualized of the cervical spine including marginal osteophytes, uncovertebral joint hypertrophy, facet arthropathy. Severe central spinal canal stenosis visualized at C4-C5 and C5-C6 with severe bilateral neuroforaminal stenosis. The soft tissues of the neck are unremarkable. The visualized lung apices demonstrate paraseptal emphysema. There is a filling defect within the anterior aspect of the trachea which may represent mucous Conclusion: Less than 50% luminal narrowing of the distal right ICA. Otherwise, no evidence of hemodynamically significant intracranial arterial stenosis. Severe degenerative disease of the cervical spine with severe spinal canal stenosis and neuroforaminal narrowing at C4-C5 and C5-C6. [place] was notified of the possible left transverse sinus thrombosis by telephone on April 13, 2017 11:32 AM.  This individual acknowledged receipt of the information and repeated it back. I personally reviewed the images and the residents findings and agree with the above. Read By Christianne Dolin- Dinesh Rao  Electronically Verified By - Christianne Dolininesh Rao  Released Date Time - 04/13/2017 11:32 AM  Resident - Lorane GellMarsela Hyska    Per Radiology:  Ir Lumbar Puncture    Result Date: 04/14/2017  Successful lumbar puncture at the level of L4-L5 with an opening pressure of 7 cm of H2O. Sample of CSF fluid sent to lab for analysis. Attending physician supervision statement: I was physically present and supervised the entire procedure. I personally reviewed the images and the residents findings and agree with the above. Read By Julio Sicks- Travis E Meyer  Electronically Verified By - Julio Sicksravis E Meyer  Released Date Time - 04/14/2017 8:27 AM  Resident - Tamala SerMario Agrait Bertran    Per Radiology:  Velora MediateMri Brain W/o & W Con

## 2017-04-15 NOTE — Discharge Summary
Vessel stenosis of the cervical internal carotid is evaluated and reported using the transverse  diameter of the normal distal internal carotid as the denominator for stenosis measurement. The stenosis is reported as a percent based upon this standard accepted criteria. Findings: The visualized portion of the aortic arch is unremarkable.  Primary branches of the aortic arch including the brachiocephalic, left common carotid, and left subclavian demonstrate normal contrast enhancement without significant stenotic lesion or aneurysm.  There is arthrosclerotic disease visualized of the bilateral proximal ICAs and bulb region. There is less than 50% luminal narrowing noted of the distal right ICA (18/452). The course of the bilateral vertebral arteries is unremarkable with normal contrast enhancement  without stenotic lesion, aneurysm, focal vascular occlusion, or dissection. No abnormal vessels are apparent. There is severe arthrosclerotic disease visualized of the cervical spine including marginal osteophytes, uncovertebral joint hypertrophy, facet arthropathy. Severe central spinal canal stenosis visualized at C4-C5 and C5-C6 with severe bilateral neuroforaminal stenosis. The soft tissues of the neck are unremarkable. The visualized lung apices demonstrate paraseptal emphysema. There is a filling defect within the anterior aspect of the trachea which may represent mucous Conclusion: Less than 50% luminal narrowing of the distal right ICA. Otherwise, no evidence of hemodynamically significant intracranial arterial stenosis. Severe degenerative disease of the cervical spine with severe spinal canal stenosis and neuroforaminal narrowing at C4-C5 and C5-C6. [place] was notified of the possible left transverse sinus thrombosis by telephone on April 13, 2017 11:32 AM.  This individual acknowledged receipt of the information and repeated it back. I personally reviewed the images and the residents findings and

## 2017-04-15 NOTE — Progress Notes
On Jan 16th at 12:16 pm per Case Manager notes patient will discharge to SNF. No appt made at this time.    Rolly PancakeJanice Davis, Care Coordinator

## 2017-04-15 NOTE — Discharge Summary
Respiratory:  Good respiratory effort, Lungs CTA/BL  MSK:  Gait and Station deferred, digits without deformity, nails present and intact   Skin: intact, warm and dry, no rashes on visual inspection of exposed skin  Psychiatric:  Alert and oriented to person only, confused but redirectable     Neurologic:   Cranial nerves:   I: not tested   II, III, IV, VI: visual fields intact; PERRL with normal confrontation & consensual response B/L; EOMI; no ptosis; no conjugate or asymmetrica nystagmus   V 1/2/3: sensation is intact on forehead, cheeks, and jaw region   VII: no facial droop; facial symmetry while smiling & wrinkling of forehead; tight lid closure   VIII: able to hear throughout the history taking process   IX & X: symmetric palatal elevation   XI: normal jaw and shoulder strength against resistance   XII: tongue is symmetrical & midline with no atrophy or fasciculations   Speech: normal   Motor:  normal bulk, normal tone; no drift   Strength:  5/5 in all muscle groups    Reflexes: 2+ and symmetric throughout    Sensory: intact to light touch in all 4 extremities    Coordination: Mild tremors present globally with intentional movement    Discharge Medications:  Current Outpatient Medications    Medication Sig Start Date End Date Taking? Authorizing Provider   aspirin 81 MG PO Tablet Take 81 mg by mouth daily.   Yes Information, Historical   atorvastatin (LIPITOR) 80 MG PO Tablet Take 80 mg by mouth nightly at bedtime.   Yes Information, Historical   cyanocobalamin (VITAMIN B-12) 1000 MCG PO Tablet Take 1,000 mcg by mouth daily.   Yes Information, Historical   gabapentin (NEURONTIN) 100 MG PO Capsule Take 200 mg by mouth 3 times daily.   Yes Information, Historical   lidocaine (LIDODERM) 5 % EX Patch Place 1 patch onto the skin every 24 hours. Each patch should be ON for 12 hours and OFF for 12 hours.   Yes Information, Historical   lisinopril (PRINIVIL,ZESTRIL) 40 MG PO Tablet Take 40 mg by mouth 2 times

## 2017-04-15 NOTE — Progress Notes
Department of Case Management  Discharge Planning Progress Note       NAME: Marinell BlightJames Frerking    MRN: [id number]    AGE: 72 y.o. DOB: 27-May-1945 Date of Admission: 04/11/2017    Payor: Payor: [employer]ANS / Plan: MEDICARE [employer] PLANS / Product Type: HMO/POS /     Advance Directive/Healthcare Decision Maker: Advance Directive/Healthcare Decision Maker: Next of Kin (NOK) Proxy (comment) (04/12/17 1556)  Legal Documentation:      Current Medical Status: Patient is medically cleared for discharge    The patient is currently: inpatient    Case Manager Assessment/Actions Taken[facility] from the patient's insurance provider and is able to accept the patient.  The patient will discharge to room 111D.  3008 and PASSR are completed.  [facility] with a 2pm pick up time.  Case manager left a voicemail message for the patient's sisters Kriste Basqueadel and Olegario MessierKathy informing them of the discharge/transfer. Nurse will call report to [phone].    Potential barriers and planned intervention: No barriers identified at this time.     Interdisciplinary updates: No interdisciplinary updates    [facility]    Expected discharge date: 04/15/2017    Maren Beachatasha C Corley    04/15/2017 11:06 AM

## 2017-04-15 NOTE — Discharge Summary
Result Date: 04/12/2017  Impression:  Small punctate area of subacute ischemic change in the left frontal subcortical white matter. No evidence for large vascular territory stroke. Advanced scattered and confluent nonspecific periventricular and subcortical white matter disease most likely reflecting small vessel chronic ischemic change. Findings are greater than expected for chronologic age. Correlation for history of chronic hypertension, diabetes, and/or  vasculitis is recommended. Additional multifocal areas of encephalomalacia, as above. Mammillary body and superior vermian atrophy which may be seen in the setting of chronic EtOH. Read By Colin Mulders- Peter Fiester M.D.  Electronically Verified By Colin Mulders- Peter Fiester M.D.  Released Date Time - 04/12/2017 1:02 PM  Resident -     Per Radiology:  Xr Chest Single View    Result Date: 04/11/2017  Impression: Left basilar subsegmental atelectasis vs. pulmonary scarring. Aortic tortuosity. Read By Burr Medico- Gregory Wynn M.D.  Electronically Verified By - Burr MedicoGregory Wynn M.D.  Released Date Time - 04/11/2017 3:08 PM  Resident -     Per Radiology:  Ctv Head W/o & W Iv Con    Result Date: 04/14/2017  Impression: No evidence of sinus thrombosis. Extensive encephalomalacia throughout the brain parenchyma as above. I personally reviewed the images and the residents findings and agree with the above. Read By Robbi Garter- Dalys Castro M.D.  Electronically Verified By Robbi Garter- Dalys Castro M.D.  Released Date Time - 04/14/2017 2:20 PM  Resident - Micheline RoughMarsela Hyska      Discharge Physical Exam:  Physical Exam:  General: alert, cooperative, no apparent distress, appears stated age, well nourished, well developed   CV: regular rhythm, S1/S2 present, no MRG; pulses in extremities palpable  ENT:  Lips, teeth and gums intact without abrasion, Oropharynx is clear without exudate. Hearing intact.   Eyes: conjunctivae/corneas clear, no ptosis of the eyelids, Pupils PERRLA, Irises clear

## 2017-04-15 NOTE — Discharge Summary
was completed without complication. CSF studies were without sign of significant derangement, no signs of infection, sendout CSF labs pending at this time include 14-3-3 Protein, HSV 1/2, NMDA Ab, total protein, glucose. At time of discharge pt was at his baseline level of confusion and medically stable for discharge.      Post-Stroke Depression Screening:  Over the last 2 weeks, how often have you been bothered by any of the following?    1. Little interest or pleasure in doing things: no and 0 - Not at all    2. Feeling down, depressed, or hopeless: no and 0 - Not at all    Total Score 3 or greater? (Positive screen): no    Plan for score 3 or greater: N/A      Stroke Measures: DISCHARGE      STK - 3:  If the patient had a-fib or a-flutter during hospitalization, were they discharged on an anticoagulant? yes, the patient had afib or a flutter and was discharged on anticoagulation  STK-5: Was antiplatelet therapy prescribed on discharge? yes    STK-6:  Was statin therapy prescribed on discharge? yes     Patient Active Problem List    Diagnosis Date Noted   ? History of dementia 04/15/2017   ? 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 History of stroke 04/15/2017   ? 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 Vascular dementia with behavior disturbance 04/15/2017   ? A-fib (CMS-HCC code) 04/15/2017   ? Weakness 04/15/2017   ? Altered mental status, unspecified altered mental status type 04/15/2017       Discharged Condition: stable    Relevant Imaging:  Per Radiology:  Ct Head W/o Iv Con    Result Date: 04/11/2017  Asymmetry of the left temporal horn in comparison to the right with subtle hypoattenuated appearance of the adjacent parenchyma; in the appropriate clinical context, an acute on chronic  ischemic infarct would be among the considerations. Consider obtaining brain MRI without contrast to further evaluate. No acute intracranial hemorrhage. Extensive diffuse cerebral

## 2017-04-15 NOTE — Plan of Care
Problem: Risk for Injury R/T Falls  Goal: Prevent Falls  Outcome: Ongoing  Bed in low/locked position,side rail up, yellow arm band on, room free of clutter, will continue to monitor.

## 2017-04-15 NOTE — Discharge Summary
daily.    Yes Information, Historical   metoprolol succinate (TOPROL-XL) 25 MG PO Tablet Extended Release 24 Hour Take 25 mg by mouth daily.   Yes Information, Historical   NIFEdipine (ADALAT CC) 30 MG PO Tablet Extended Release 24 Hour Take 30 mg by mouth daily.   Yes Information, Historical   oxybutynin (DITROPAN) 5 MG PO Tablet Take 5 mg by mouth 3 times daily.   Yes Information, Historical   ticagrelor (BRILINTA) 90 MG PO Tablet Take 90 mg by mouth 2 times daily.   Yes Information, Historical   traZODone (DESYREL) 50 MG PO Tablet Take 50 mg by mouth nightly at bedtime.    Yes Information, Historical       Discharge Labs:  No results for input(s): WBC, HGB, HCT, PLT, MCV, NEUTROPCT in the last 72 hours.    No results for input(s): NA, K, CL, CO2, BUN, CREATININE, GLU, CALCIUM, MG in the last 72 hours.    Invalid input(s): PHOSPHORUS    Lab Results   Component Value Date/Time    CHOL 125 04/12/2017 06:07 AM    TRIG 85 04/12/2017 06:07 AM    HDL 31 (L) 04/12/2017 06:07 AM    LDL 77 04/12/2017 06:07 AM       Lab Results   Component Value Date    TSH 1.860 04/11/2017       Lab Results   Component Value Date/Time    HGBA1C 5.6 04/12/2017 06:07 AM       Pt given the following 'patient instructions' on discharge:  Patient to follow up in the Stroke Clinic in the outpatient Neurology Clinic in 3 months.    Disposition: skilled nursing facility    The patient and/or family is agreeable to the discharge plan & follow up and voiced their understanding.  Jon BlightJames Meulemans is hemodynamically stable and ready for discharge.  The discharge plan was discussed with the attending.    Albertina SenegalZachary S Rosensweet, DO  04/15/2017 9:55 AM

## 2017-04-15 NOTE — Discharge Summary
agree with the above. Read By Christianne Dolin- Dinesh Rao  Electronically Verified By - Christianne Dolininesh Rao  Released Date Time - 04/13/2017 11:32 AM  Resident - Lorane GellMarsela Hyska    Per Radiology:  Cta Neck Carotid W/o & W Iv Con    Result Date: 04/13/2017  Nonvisualization of the left frontal subcortical punctate subacute ischemic infarct best visualized on the most recent MRI of the brain. No evidence of hemorrhagic transformation Chronic findings as described above. Normal CTA evaluation of the circle of Willis. Possible left transverse, sigmoid and jugular bulb thrombosis. This is age-indeterminate. Follow-up with CT venogram and/or MRI with and without gadolinium is recommended. -------------------------------------------------------------------------------------- CT Angiography Extracranial/Cervical Clinical indication:Stroke Comparison:None Technique: CT angiographic images were obtained of the cervical carotid arteries from the arch to the skull base following intravenous contrast administration using rapid image acquisition parameters. The images include the vertebral arteries in their cervical segments within the transverse foramen. The images are reviewed in multiplanar projections. 3-D MIP images are provided and reviewed. Vessel stenosis of the cervical internal carotid is evaluated and reported using the transverse  diameter of the normal distal internal carotid as the denominator for stenosis measurement. The stenosis is reported as a percent based upon this standard accepted criteria. Findings: The visualized portion of the aortic arch is unremarkable.  Primary branches of the aortic arch including the brachiocephalic, left common carotid, and left subclavian demonstrate normal contrast enhancement without significant stenotic lesion or aneurysm.  There is arthrosclerotic disease visualized of the bilateral proximal ICAs and bulb region. There is less than 50% luminal narrowing noted of the distal right ICA (18/452). The

## 2017-04-15 NOTE — Discharge Summary
volume loss with ex vacuo dilatation of the ventricular system with extensive periventricular, subcortical white matter disease likely secondary to chronic microvascular disease. Intracranial atherosclerosis. Encephalomalacia of the bilateral MCA territory, left greater than right as well as left frontal lobe as described above consistent with remote ischemic infarcts Dr. Laurian BrimAnterior Grozenski was notified of the possible left temporal lobe acute on chronic ischemic infarct & brain MRI recommendation by telephone on 04/11/2016 at 4:26 PM.  This individual acknowledged receipt of the information and repeated it back. I personally reviewed the images and the residents findings and agree with the above. Read By Para March- Erick Blaudeau  Electronically Verified By - Para MarchErick Blaudeau  Released Date Time - 04/11/2017 6:58 PM  Resident - Lorane GellMarsela Hyska    Per Radiology:  Levy Sjogrenta Head W/o & W Iv Con    Result Date: 04/13/2017  Nonvisualization of the left frontal subcortical punctate subacute ischemic infarct best visualized on the most recent MRI of the brain. No evidence of hemorrhagic transformation Chronic findings as described above. Normal CTA evaluation of the circle of Willis. Possible left transverse, sigmoid and jugular bulb thrombosis. This is age-indeterminate. Follow-up with CT venogram and/or MRI with and without gadolinium is recommended. -------------------------------------------------------------------------------------- CT Angiography Extracranial/Cervical Clinical indication:Stroke Comparison:None Technique: CT angiographic images were obtained of the cervical carotid arteries from the arch to the skull base following intravenous contrast administration using rapid image acquisition parameters. The images include the vertebral arteries in their cervical segments within the transverse foramen. The images are reviewed in multiplanar projections. 3-D MIP images are provided and reviewed.

## 2017-04-15 NOTE — Plan of Care
Problem: Risk for Injury R/T Falls  Goal: Use a bed and/or chair alarm    Intervention: Fall precautions (yellow armband, signage posted, low bed, call bell within reach, three side rails on bed, bed lock, bed/chair alarm, walking aids, declutter room, etc.)  Patient educated on fall precautions, however patient continues to attempt to get out of bed. Fall precaution teaching reinforced. Bed locked at lowest position. Bed alarm activated. Side rails upx4. Call light within reach. No acute distress noted. Will continue to monitor.

## 2017-04-15 NOTE — Discharge Summary
Department of Neurology  Discharge Summary [id number]  STROKE      Patient Name: Jon BlightJames Govan  Patient MRN: [id number]  Patient DOB: 03/29/1946   LOS: 4 days   PCP: No primary care provider on file.    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     Hospital Course:  [name]?is a 72 y.o.?male?w/ a PMH of alcoholism, dementia (likely vascular), x3 strokes, [facility] for same complaint with discharge to nursing home?who presented on 04/11/2017 ?1:55 PM?for evaluation of AMS and agitation. Pt was admitted to the General Neurology Service for work up. CT head was absent any sign of acute intracranial hemorrhage. However did show extensive diffuse cerebral volume loss with ex vacuo dilatation of the ventricular system with extensive periventricular, subcortical white matter disease likely secondary to chronic microvascular disease. MRI Brain completed showing a small punctate area of subacute ischemic change in the left frontal subcortical white matter without evidence for large vascular territory stroke. CTA Head and Neck was completed and showed approximately 50% luminal narrowing of the distal right ICA. CTV Head was without evidence of sinus thrombosis. Vit B12  And Folate were normal, pt started on Thiamin IV?for suspicion of Wernicke's encephalopathy. Pt underwent diagnostic LP that

## 2017-04-16 NOTE — Progress Notes
Patient has been discharge. Iv was removed , no redness or swelling observed after removing IV. Discharge instructions and education were given [facility]. Condom catheter removed. Patient left the unit via ambulance.

## 2017-07-10 ENCOUNTER — Other Ambulatory Visit: Payer: Self-pay | Admitting: Neurology

## 2017-07-10 DIAGNOSIS — R2681 Unsteadiness on feet: Secondary | ICD-10-CM

## 2017-07-15 ENCOUNTER — Ambulatory Visit: Payer: Medicare Other

## 2017-08-27 ENCOUNTER — Ambulatory Visit
Admission: RE | Admit: 2017-08-27 | Discharge: 2017-08-27 | Disposition: A | Discharge status: Home / Self Care | Payer: Medicare Other | Source: Ambulatory Visit | Attending: Neurology | Admitting: Neurology

## 2017-08-27 DIAGNOSIS — R2681 Unsteadiness on feet: Secondary | ICD-10-CM | POA: Insufficient documentation

## 2017-08-27 DIAGNOSIS — R296 Repeated falls: Secondary | ICD-10-CM | POA: Diagnosis present

## 2017-10-13 DIAGNOSIS — E1142 Type 2 diabetes mellitus with diabetic polyneuropathy: Secondary | ICD-10-CM | POA: Insufficient documentation

## 2018-01-26 DIAGNOSIS — K429 Umbilical hernia without obstruction or gangrene: Secondary | ICD-10-CM | POA: Insufficient documentation

## 2018-05-01 DEATH — deceased

## 2019-08-18 IMAGING — MR MR HEAD W/O CM
10 series · 48 of 48 positions shown · non-contrast
Comparison: None.

CLINICAL DATA: Unsteady gait.  Multiple falls since [REDACTED].

EXAM:
MRI HEAD WITHOUT CONTRAST
TECHNIQUE: Multiplanar, multiecho pulse sequences of the brain and surrounding
structures were obtained without intravenous contrast.

[Series 2: T1 · sagittal · 5.0mm · 0.45mm/px · 3 of 27 slices shown (1 of 2)]
[im 1/27]
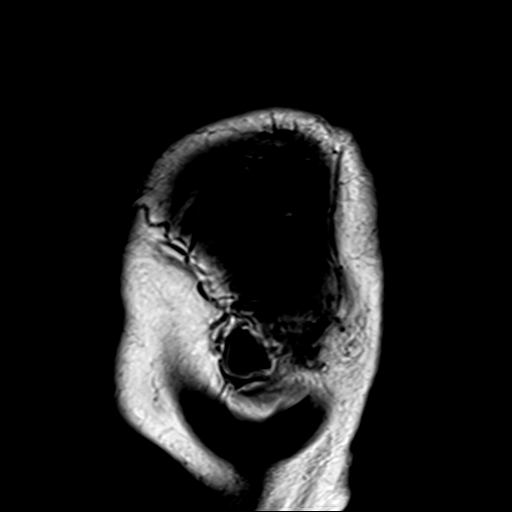
[im 14/27]
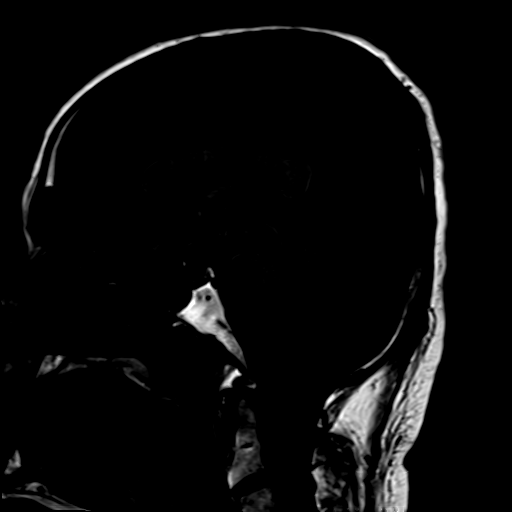
[im 27/27]
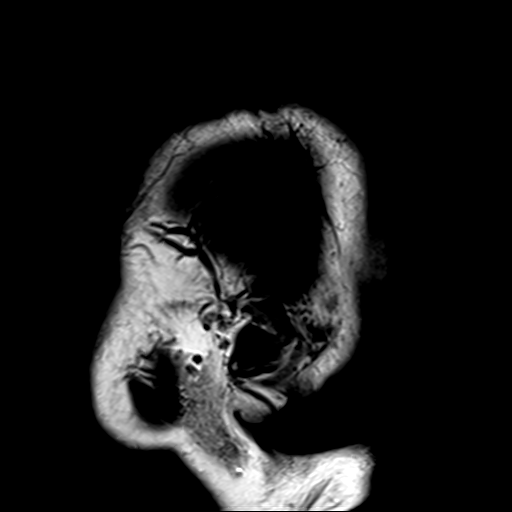

[Series 4: DWI · axial · 3.0mm · 1.80mm/px · z∈[-80,+82]mm · 5 of 49 slices shown (1 of 4)]
[im 1/49]
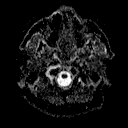
[im 13/49]
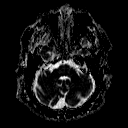
[im 25/49]
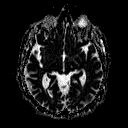
[im 37/49]
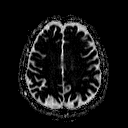
[im 49/49]
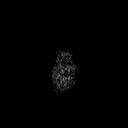

[Series 6: DWI · coronal · 3.0mm · 1.80mm/px · 4 of 45 slices shown (2 of 4)]
[im 1/45]
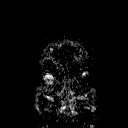
[im 15/45]
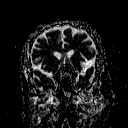
[im 30/45]
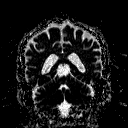
[im 45/45]
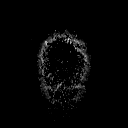

[Series 7: T2 · axial · 5.0mm · 0.60mm/px · z∈[-95,+74]mm · 2 of 27 slices shown (1 of 3)]
[im 1/27]
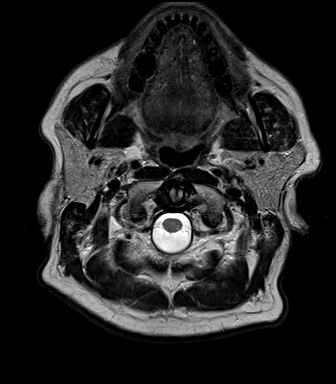
[im 27/27]
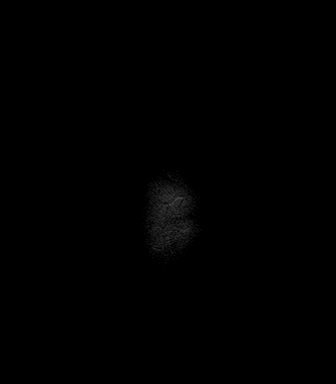

[Series 8: FLAIR · axial · 3.0mm · 0.45mm/px · z∈[-88,+67]mm · 5 of 53 slices shown]
[im 1/53]
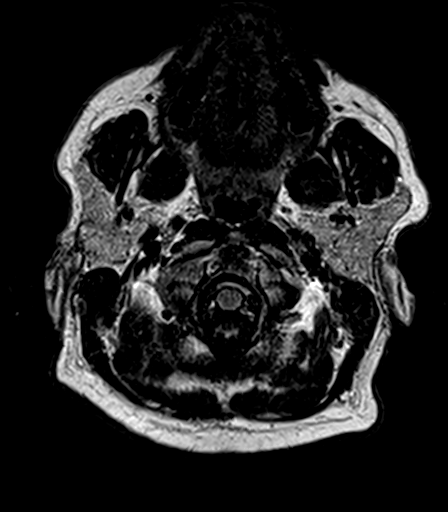
[im 14/53]
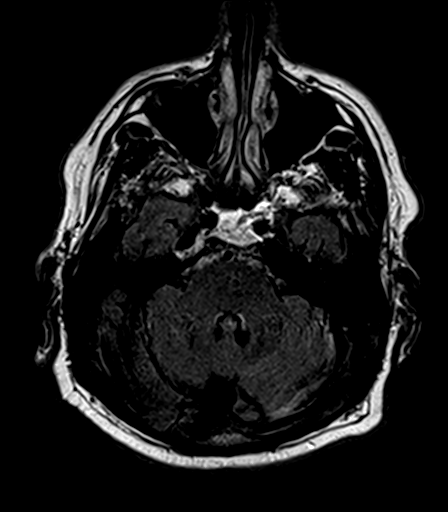
[im 27/53]
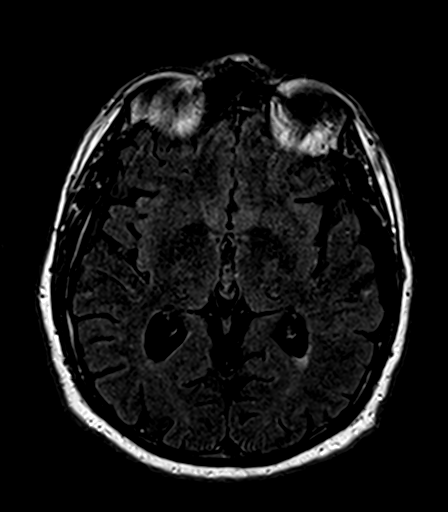
[im 40/53]
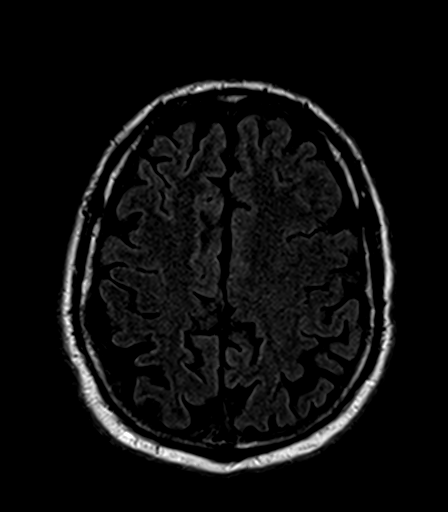
[im 53/53]
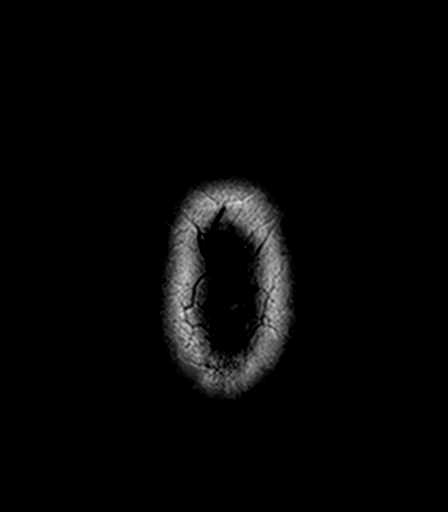

[Series 9: T2 · axial · 5.0mm · 0.45mm/px · z∈[-95,+74]mm · 2 of 27 slices shown (2 of 3)]
[im 1/27]
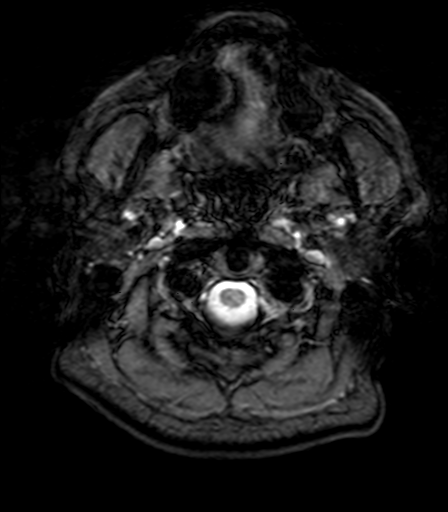
[im 27/27]
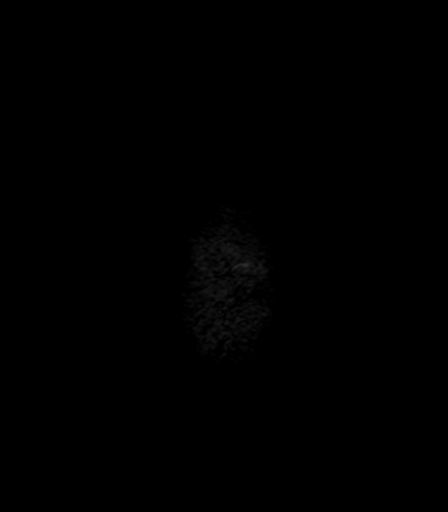

[Series 10: T1 · axial · 1.0mm · 1.00mm/px · z∈[-99,+76]mm · 16 of 176 slices shown (2 of 2)]
[im 1/176]
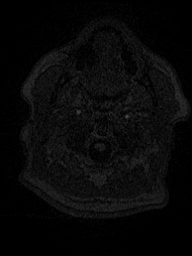
[im 12/176]
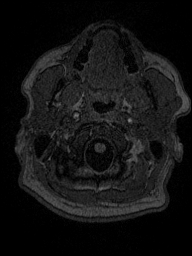
[im 24/176]
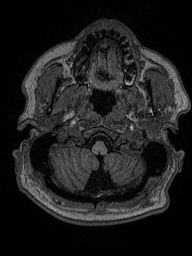
[im 36/176]
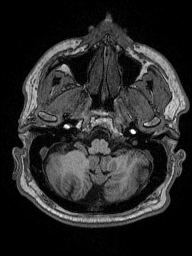
[im 47/176]
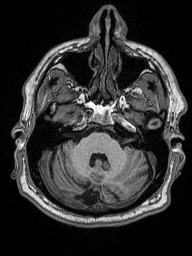
[im 59/176]
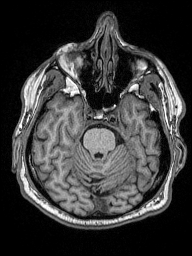
[im 71/176]
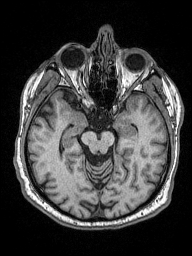
[im 82/176]
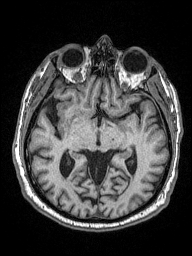
[im 94/176]
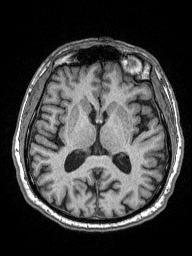
[im 106/176]
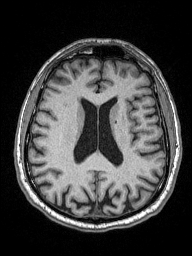
[im 117/176]
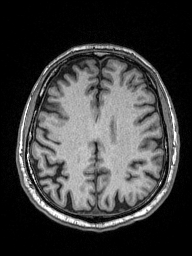
[im 129/176]
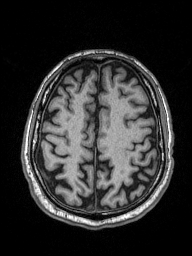
[im 141/176]
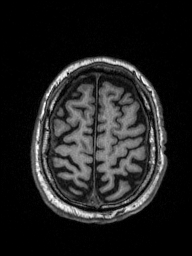
[im 152/176]
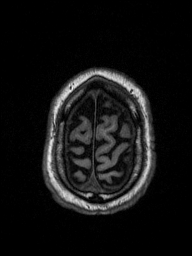
[im 164/176]
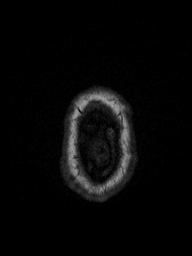
[im 176/176]
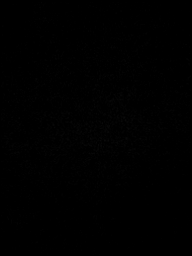

[Series 11: T2 · coronal · 5.0mm · 0.49mm/px · 2 of 27 slices shown (3 of 3)]
[im 1/27]
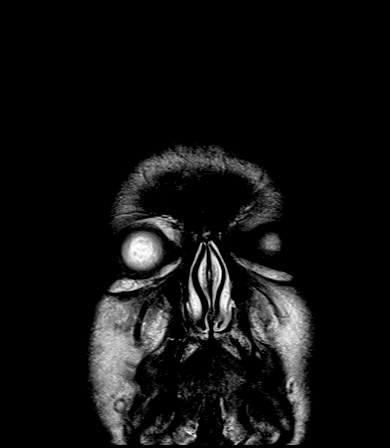
[im 27/27]
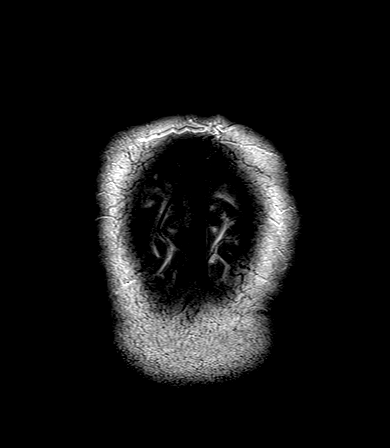

[Series 100: DWI · axial · 3.0mm · 1.80mm/px · z∈[-80,+82]mm · 5 of 55 slices shown (3 of 4)]
[im 1/55]
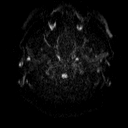
[im 14/55]
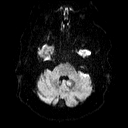
[im 28/55]
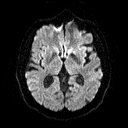
[im 41/55]
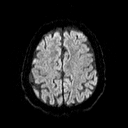
[im 55/55]
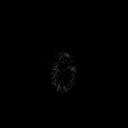

[Series 101: DWI · coronal · 3.0mm · 1.80mm/px · 4 of 45 slices shown (4 of 4)]
[im 1/45]
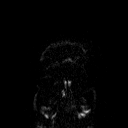
[im 15/45]
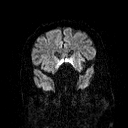
[im 30/45]
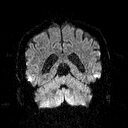
[im 45/45]
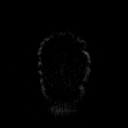

[48 of 48 positions shown; findings below may reference images not displayed]

FINDINGS: Brain: No infarct, hemorrhage, hydrocephalus, collection, or
masslike finding.

Mild for age cerebral volume loss.  Age normal white matter signal.

Grossly symmetric and normal temporal bone signal.

Vascular: Major flow voids are preserved.

Skull and upper cervical spine: No evidence of marrow lesion.

Sinuses/Orbits: Bilateral cataract resection. Clear sinuses and
mastoids.
IMPRESSION: Unremarkable exam for age.

## 2020-08-23 ENCOUNTER — Ambulatory Visit (INDEPENDENT_AMBULATORY_CARE_PROVIDER_SITE_OTHER): Payer: Medicare Other | Admitting: Dermatology

## 2020-08-23 ENCOUNTER — Other Ambulatory Visit: Payer: Self-pay

## 2020-08-23 DIAGNOSIS — L821 Other seborrheic keratosis: Secondary | ICD-10-CM

## 2020-08-23 DIAGNOSIS — L719 Rosacea, unspecified: Secondary | ICD-10-CM | POA: Diagnosis not present

## 2020-08-23 DIAGNOSIS — L814 Other melanin hyperpigmentation: Secondary | ICD-10-CM

## 2020-08-23 DIAGNOSIS — D2372 Other benign neoplasm of skin of left lower limb, including hip: Secondary | ICD-10-CM

## 2020-08-23 DIAGNOSIS — L570 Actinic keratosis: Secondary | ICD-10-CM | POA: Diagnosis not present

## 2020-08-23 DIAGNOSIS — D239 Other benign neoplasm of skin, unspecified: Secondary | ICD-10-CM

## 2020-08-23 DIAGNOSIS — I872 Venous insufficiency (chronic) (peripheral): Secondary | ICD-10-CM

## 2020-08-23 DIAGNOSIS — L578 Other skin changes due to chronic exposure to nonionizing radiation: Secondary | ICD-10-CM

## 2020-08-23 DIAGNOSIS — D229 Melanocytic nevi, unspecified: Secondary | ICD-10-CM

## 2020-08-23 DIAGNOSIS — Z1283 Encounter for screening for malignant neoplasm of skin: Secondary | ICD-10-CM

## 2020-08-23 DIAGNOSIS — D1800 Hemangioma unspecified site: Secondary | ICD-10-CM

## 2020-08-23 NOTE — Progress Notes (Signed)
Follow-Up Visit   Subjective  Todd Farley is a 75 y.o. male who presents for the following: Annual Exam (Hx AK's ). The patient presents for Total-Body Skin Exam (TBSE) for skin cancer screening and mole check.  The following portions of the chart were reviewed this encounter and updated as appropriate:   Tobacco  Allergies  Meds  Problems  Med Hx  Surg Hx  Fam Hx     Review of Systems:  No other skin or systemic complaints except as noted in HPI or Assessment and Plan.  Objective  Well appearing patient in no apparent distress; mood and affect are within normal limits.  A full examination was performed including scalp, head, eyes, ears, nose, lips, neck, chest, axillae, abdomen, back, buttocks, bilateral upper extremities, bilateral lower extremities, hands, feet, fingers, toes, fingernails, and toenails. All findings within normal limits unless otherwise noted below.  Objective  Scalp x 2, L ear x 2 (4): Erythematous thin papules/macules with gritty scale.   Objective  B/L leg: Erythema.   Objective  Left Thigh - Posterior: Firm pink papule.   Assessment & Plan  AK (actinic keratosis) (4) Scalp x 2, L ear x 2 Destruction of lesion - Scalp x 2, L ear x 2 Complexity: simple   Destruction method: cryotherapy   Informed consent: discussed and consent obtained   Timeout:  patient name, date of birth, surgical site, and procedure verified Lesion destroyed using liquid nitrogen: Yes   Region frozen until ice ball extended beyond lesion: Yes   Outcome: patient tolerated procedure well with no complications   Post-procedure details: wound care instructions given    Rosacea Face Rosacea is a chronic progressive skin condition usually affecting the face of adults, causing redness and/or acne bumps. It is treatable but not curable. It sometimes affects the eyes (ocular rosacea) as well. It may respond to topical and/or systemic medication and can flare with stress, sun  exposure, alcohol, exercise and some foods.  Daily application of broad spectrum spf 30+ sunscreen to face is recommended to reduce flares.  Start Skin Medicinals triple mix BID.   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 Stasis in the legs causes chronic leg swelling, which may result in itchy or painful rashes, skin discoloration, skin texture changes, and sometimes ulceration.  Recommend daily compression hose/stockings- easiest to put on first thing in morning, remove at bedtime.  Elevate legs as much as possible. Avoid salt/sodium rich foods.  Dermatofibroma Left Thigh - Posterior Benign-appearing.  Observation.  Call clinic for new or changing lesions.  Recommend daily use of broad spectrum spf 30+ sunscreen to sun-exposed areas.     Lentigines - Scattered tan macules - Due to sun exposure - Benign-appering, observe - Recommend daily broad spectrum sunscreen SPF 30+ to sun-exposed areas, reapply every 2 hours as needed. - Call for any changes  Seborrheic Keratoses - Stuck-on, waxy, tan-brown papules and/or plaques  - Benign-appearing - Discussed benign etiology and prognosis. - Observe - Call for any changes  Melanocytic Nevi - Tan-brown and/or pink-flesh-colored symmetric macules and papules - Benign appearing on exam today - Observation - Call clinic for new or changing moles - Recommend daily use of broad spectrum spf 30+ sunscreen to sun-exposed areas.   Hemangiomas - Red papules - Discussed benign nature - Observe - Call for any changes  Actinic Damage - Chronic condition, secondary to cumulative UV/sun exposure - diffuse scaly erythematous macules with underlying dyspigmentation -  Recommend daily broad spectrum sunscreen SPF 30+ to sun-exposed areas, reapply every 2 hours as needed.  - Staying in the shade or wearing long sleeves, sun glasses (UVA+UVB protection) and wide brim hats (4-inch brim around the entire circumference of the  hat) are also recommended for sun protection.  - Call for new or changing lesions.  Varicose Veins - Dilated blue, purple or red veins at the lower extremities - Reassured - These can be treated by sclerotherapy (a procedure to inject a medicine into the veins to make them disappear) if desired, but the treatment is not covered by insurance  Skin cancer screening performed today.  Return in about 1 year (around 08/23/2021) for TBSE.  Luther Redo, CMA, am acting as scribe for Sarina Ser, MD .  Documentation: I have reviewed the above documentation for accuracy and completeness, and I agree with the above.  Sarina Ser, MD

## 2020-08-23 NOTE — Patient Instructions (Addendum)
If you have any questions or concerns for your doctor, please call our main line at [phone] and press option 4 to reach your doctor's medical assistant. If no one answers, please leave a voicemail as directed and we will return your call as soon as possible. Messages left after 4 pm will be answered the following business day.   You may also send us a message via MyChart. We typically respond to MyChart messages within 1-2 business days.  For prescription refills, please ask your pharmacy to contact our office. Our fax number is [phone].  If you have an urgent issue when the clinic is closed that cannot wait until the next business day, you can page your doctor at the number below.    Please note that while we do our best to be available for urgent issues outside of office hours, we are not available 24/7.   If you have an urgent issue and are unable to reach us, you may choose to seek medical care at your doctor's office, retail clinic, urgent care center, or emergency room.  If you have a medical emergency, please immediately call 911 or go to the emergency department.  Pager Numbers  - Dr. Kowalski: [phone]  - Dr. Moye: [phone]  - Dr. Stewart: [phone]  In the event of inclement weather, please call our main line at [phone] for an update on the status of any delays or closures.  Dermatology Medication Tips: Please keep the boxes that topical medications come in in order to help keep track of the instructions about where and how to use these. Pharmacies typically print the medication instructions only on the boxes and not directly on the medication tubes.   If your medication is too expensive, please contact our office at [phone] option 4 or send us a message through MyChart.   We are unable to tell what your co-pay for medications will be in advance as this is different depending on your insurance coverage. However, we may be able to find a substitute  medication at lower cost or fill out paperwork to get insurance to cover a needed medication.   If a prior authorization is required to get your medication covered by your insurance company, please allow us 1-2 business days to complete this process.  Drug prices often vary depending on where the prescription is filled and some pharmacies may offer cheaper prices.  The website www.goodrx.com contains coupons for medications through different pharmacies. The prices here do not account for what the cost may be with help from insurance (it may be cheaper with your insurance), but the website can give you the price if you did not use any insurance.  - You can print the associated coupon and take it with your prescription to the pharmacy.  - You may also stop by our office during regular business hours and pick up a GoodRx coupon card.  - If you need your prescription sent electronically to a different pharmacy, notify our office through [place] MyChart or by phone at [phone] option 4.  Instructions for [facility] Medications  One or more of your medications was sent to the [facility] mail order compounding pharmacy. You will receive an email from them and can purchase the medicine through that link. It will then be mailed to your home at the address you confirmed. If for any reason you do not receive an email from them, please check your spam folder. If you still do not find the email,   please let us know. [facility] phone number is [phone].   

## 2020-08-27 ENCOUNTER — Encounter: Payer: Self-pay | Admitting: Dermatology

## 2021-08-22 ENCOUNTER — Encounter: Payer: Self-pay | Admitting: Dermatology

## 2021-08-22 ENCOUNTER — Ambulatory Visit (INDEPENDENT_AMBULATORY_CARE_PROVIDER_SITE_OTHER): Payer: Medicare Other | Admitting: Dermatology

## 2021-08-22 DIAGNOSIS — L821 Other seborrheic keratosis: Secondary | ICD-10-CM

## 2021-08-22 DIAGNOSIS — L570 Actinic keratosis: Secondary | ICD-10-CM

## 2021-08-22 DIAGNOSIS — L578 Other skin changes due to chronic exposure to nonionizing radiation: Secondary | ICD-10-CM | POA: Diagnosis not present

## 2021-08-22 DIAGNOSIS — D229 Melanocytic nevi, unspecified: Secondary | ICD-10-CM

## 2021-08-22 DIAGNOSIS — L814 Other melanin hyperpigmentation: Secondary | ICD-10-CM

## 2021-08-22 DIAGNOSIS — Z1283 Encounter for screening for malignant neoplasm of skin: Secondary | ICD-10-CM | POA: Diagnosis not present

## 2021-08-22 DIAGNOSIS — D1800 Hemangioma unspecified site: Secondary | ICD-10-CM

## 2021-08-22 DIAGNOSIS — L719 Rosacea, unspecified: Secondary | ICD-10-CM

## 2021-08-22 MED ORDER — FLUOROURACIL 5 % EX CREA: TOPICAL_CREAM | Freq: Two times a day (BID) | CUTANEOUS | 1 refills | Status: DC

## 2021-08-22 NOTE — Progress Notes (Signed)
Follow-Up Visit   Subjective  Todd Farley is a 76 y.o. male who presents for the following: Total body skin exam (Hx of AKs), check spot (Back, pts wife noticed), and Rosacea (Face, SM Triple cream qhs). The patient presents for Total-Body Skin Exam (TBSE) for skin cancer screening and mole check.  The patient has spots, moles and lesions to be evaluated, some may be new or changing and the patient has concerns that these could be cancer.   The following portions of the chart were reviewed this encounter and updated as appropriate:   Tobacco  Allergies  Meds  Problems  Med Hx  Surg Hx  Fam Hx     Review of Systems:  No other skin or systemic complaints except as noted in HPI or Assessment and Plan.  Objective  Well appearing patient in no apparent distress; mood and affect are within normal limits.  A full examination was performed including scalp, head, eyes, ears, nose, lips, neck, chest, axillae, abdomen, back, buttocks, bilateral upper extremities, bilateral lower extremities, hands, feet, fingers, toes, fingernails, and toenails. All findings within normal limits unless otherwise noted below.  scalp, forehead x 4 (4) Pink scaly macules  Nose Erythema face   Assessment & Plan   Lentigines - Scattered tan macules - Due to sun exposure - Benign-appearing, observe - Recommend daily broad spectrum sunscreen SPF 30+ to sun-exposed areas, reapply every 2 hours as needed. - Call for any changes - upper back  Seborrheic Keratoses - Stuck-on, waxy, tan-brown papules and/or plaques  - Benign-appearing - Discussed benign etiology and prognosis. - Observe - Call for any changes - back  Melanocytic Nevi - Tan-brown and/or pink-flesh-colored symmetric macules and papules - Benign appearing on exam today - Observation - Call clinic for new or changing moles - Recommend daily use of broad spectrum spf 30+ sunscreen to sun-exposed areas.  - back  Hemangiomas - Red  papules - Discussed benign nature - Observe - Call for any changes - back, chest, abdomen  Actinic Damage - Severe, confluent actinic changes with pre-cancerous actinic keratoses  - Severe, chronic, not at goal, secondary to cumulative UV radiation exposure over time - diffuse scaly erythematous macules and papules with underlying dyspigmentation - Discussed Prescription "Field Treatment" for Severe, Chronic Confluent Actinic Changes with Pre-Cancerous Actinic Keratoses Field treatment involves treatment of an entire area of skin that has confluent Actinic Changes (Sun/ Ultraviolet light damage) and PreCancerous Actinic Keratoses by method of PhotoDynamic Therapy (PDT) and/or prescription Topical Chemotherapy agents such as 5-fluorouracil, 5-fluorouracil/calcipotriene, and/or imiquimod.  The purpose is to decrease the number of clinically evident and subclinical PreCancerous lesions to prevent progression to development of skin cancer by chemically destroying early precancer changes that may or may not be visible.  It has been shown to reduce the risk of developing skin cancer in the treated area. As a result of treatment, redness, scaling, crusting, and open sores may occur during treatment course. One or more than one of these methods may be used and may have to be used several times to control, suppress and eliminate the PreCancerous changes. Discussed treatment course, expected reaction, and possible side effects. - Recommend daily broad spectrum sunscreen SPF 30+ to sun-exposed areas, reapply every 2 hours as needed.  - Staying in the shade or wearing long sleeves, sun glasses (UVA+UVB protection) and wide brim hats (4-inch brim around the entire circumference of the hat) are also recommended. - Call for new or changing lesions.  Start  5-fluorouracil/calcipotriene cream twice a day for 7 days to affected areas including forehead, temples and scalp. Prescription sent to [facility]. Patient  provided with contact information for pharmacy and advised the pharmacy will mail the prescription to their home. Patient provided with handout reviewing treatment course and side effects and advised to call or message [place] on MyChart with any concerns.  Skin cancer screening performed today.  AK (actinic keratosis) (4) scalp, forehead x 4  Destruction of lesion - scalp, forehead x 4 Complexity: simple   Destruction method: cryotherapy   Informed consent: discussed and consent obtained   Timeout:  patient name, date of birth, surgical site, and procedure verified Lesion destroyed using liquid nitrogen: Yes   Region frozen until ice ball extended beyond lesion: Yes   Outcome: patient tolerated procedure well with no complications   Post-procedure details: wound care instructions given    fluorouracil (EFUDEX) 5 % cream - scalp, forehead x 4 Apply topically 2 (two) times daily. Bid for 7 days to scalp, forehead, temples  Rosacea Nose  Rosacea is a chronic progressive skin condition usually affecting the face of adults, causing redness and/or acne bumps. It is treatable but not curable. It sometimes affects the eyes (ocular rosacea) as well. It may respond to topical and/or systemic medication and can flare with stress, sun exposure, alcohol, exercise and some foods.  Daily application of broad spectrum spf 30+ sunscreen to face is recommended to reduce flares.  Cont [facility] Triple cream qhs  Skin cancer screening   Return in about 6 months (around 02/22/2022) for Hx of AKs.  I, Todd Farley, RMA, am acting as scribe for Todd Ser, MD . Documentation: I have reviewed the above documentation for accuracy and completeness, and I agree with the above.  Todd Ser, MD

## 2021-08-22 NOTE — Patient Instructions (Addendum)
Start 5-fluorouracil/calcipotriene cream twice a day for 7 days to affected areas including forehead, temples and scalp. Prescription sent to [facility]. Patient provided with contact information for pharmacy and advised the pharmacy will mail the prescription to their home. Patient provided with handout reviewing treatment course and side effects and advised to call or message [place] on MyChart with any concerns.   5-Fluorouracil/Calcipotriene Patient Education   Actinic keratoses are the dry, red scaly spots on the skin caused by sun damage. A portion of these spots can turn into skin cancer with time, and treating them can help prevent development of skin cancer.   Treatment of these spots requires removal of the defective skin cells. There are various ways to remove actinic keratoses, including freezing with liquid nitrogen, treatment with creams, or treatment with a blue light procedure in the office.   5-fluorouracil cream is a topical cream used to treat actinic keratoses. It works by interfering with the growth of abnormal fast-growing skin cells, such as actinic keratoses. These cells peel off and are replaced by healthy ones.   5-fluorouracil/calcipotriene is a combination of the 5-fluorouracil cream with a vitamin D analog cream called calcipotriene. The calcipotriene alone does not treat actinic keratoses. However, when it is combined with 5-fluorouracil, it helps the 5-fluorouracil treat the actinic keratoses much faster so that the same results can be achieved with a much shorter treatment time.  INSTRUCTIONS FOR 5-FLUOROURACIL/CALCIPOTRIENE CREAM:   5-fluorouracil/calcipotriene cream typically only needs to be used for 4-7 days. A thin layer should be applied twice a day to the treatment areas recommended by your physician.   If your physician prescribed you separate tubes of 5-fluourouracil and calcipotriene, apply a thin layer of 5-fluorouracil followed by a thin layer of  calcipotriene.   Avoid contact with your eyes, nostrils, and mouth. Do not use 5-fluorouracil/calcipotriene cream on infected or open wounds.   You will develop redness, irritation and some crusting at areas where you have pre-cancer damage/actinic keratoses. IF YOU DEVELOP PAIN, BLEEDING, OR SIGNIFICANT CRUSTING, STOP THE TREATMENT EARLY - you have already gotten a good response and the actinic keratoses should clear up well.  Wash your hands after applying 5-fluorouracil 5% cream on your skin.   A moisturizer or sunscreen with a minimum SPF 30 should be applied each morning.   Once you have finished the treatment, you can apply a thin layer of Vaseline twice a day to irritated areas to soothe and calm the areas more quickly. If you experience significant discomfort, contact your physician.  For some patients it is necessary to repeat the treatment for best results.  SIDE EFFECTS: When using 5-fluorouracil/calcipotriene cream, you may have mild irritation, such as redness, dryness, swelling, or a mild burning sensation. This usually resolves within 2 weeks. The more actinic keratoses you have, the more redness and inflammation you can expect during treatment. Eye irritation has been reported rarely. If this occurs, please let us know.   If you have any trouble using this cream, please call the office. If you have any other questions about this information, please do not hesitate to ask me before you leave the office.        If You Need Anything After Your Visit  If you have any questions or concerns for your doctor, please call our main line at [phone] and press option 4 to reach your doctor's medical assistant. If no one answers, please leave a voicemail as directed and we will return your call as  soon as possible. Messages left after 4 pm will be answered the following business day.   You may also send [place] a message via [place]. We typically respond to MyChart messages within 1-2  business days.  For prescription refills, please ask your pharmacy to contact our office. Our fax number is [phone].  If you have an urgent issue when the clinic is closed that cannot wait until the next business day, you can page your doctor at the number below.    Please note that while we do our best to be available for urgent issues outside of office hours, we are not available 24/7.   If you have an urgent issue and are unable to reach [place], you may choose to seek medical care at your doctor's office, retail clinic, urgent care center, or emergency room.  If you have a medical emergency, please immediately call 911 or go to the emergency department.  Pager Numbers  - Dr. Nehemiah Massed: [phone]  - Dr. Laurence Ferrari: [phone]  - Dr. Nicole Kindred: [phone]  In the event of inclement weather, please call our main line at [phone] for an update on the status of any delays or closures.  Dermatology Medication Tips: Please keep the boxes that topical medications come in in order to help keep track of the instructions about where and how to use these. Pharmacies typically print the medication instructions only on the boxes and not directly on the medication tubes.   If your medication is too expensive, please contact our office at [phone] option 4 or send [place] a message through [place].   We are unable to tell what your co-pay for medications will be in advance as this is different depending on your insurance coverage. However, we may be able to find a substitute medication at lower cost or fill out paperwork to get insurance to cover a needed medication.   If a prior authorization is required to get your medication covered by your insurance company, please allow [place] 1-2 business days to complete this process.  Drug prices often vary depending on where the prescription is filled and some pharmacies may offer cheaper prices.  The website www.goodrx.com contains coupons for medications  through different pharmacies. The prices here do not account for what the cost may be with help from insurance (it may be cheaper with your insurance), but the website can give you the price if you did not use any insurance.  - You can print the associated coupon and take it with your prescription to the pharmacy.  - You may also stop by our office during regular business hours and pick up a GoodRx coupon card.  - If you need your prescription sent electronically to a different pharmacy, notify our office through [facility] or by phone at [phone] option 4.     Si Usted Necesita Algo Despus de Su Visita  Tambin puede enviarnos un mensaje a travs de Pharmacist, community. Por lo general respondemos a los mensajes de MyChart en el transcurso de 1 a 2 das hbiles.  Para renovar recetas, por favor pida a su farmacia que se ponga en contacto con nuestra oficina. Harland Dingwall de fax es [place] [phone].  Si tiene un asunto urgente cuando la clnica est cerrada y que no puede esperar hasta el siguiente da hbil, puede llamar/localizar a su doctor(a) al nmero que aparece a continuacin.   Por favor, tenga en cuenta que aunque hacemos todo lo posible para estar disponibles para asuntos urgentes fuera del horario  de oficina, no estamos disponibles las 24 horas del da, los 7 das de la [place].   Si tiene un problema urgente y no puede comunicarse con nosotros, puede optar por buscar atencin mdica  en el consultorio de su doctor(a), en una clnica privada, en un centro de atencin urgente o en una sala de emergencias.  Si tiene Engineering geologist, por favor llame inmediatamente al 911 o vaya a la sala de emergencias.  Nmeros de bper  - Dr. Nehemiah Massed: [phone]  - Dra. Moye: [phone]  - Dra. Nicole Kindred: [phone]  En caso de inclemencias del [place], por favor llame a Johnsie Kindred principal al [phone] para una actualizacin sobre el [place] de cualquier retraso o  cierre.  Consejos para la medicacin en dermatologa: Por favor, guarde las cajas en las que vienen los medicamentos de uso tpico para ayudarle a seguir las instrucciones sobre dnde y cmo usarlos. Las farmacias generalmente imprimen las instrucciones del medicamento slo en las cajas y no directamente en los tubos del [place].   Si su medicamento es muy caro, por favor, pngase en contacto con Zigmund Daniel llamando al [phone] y presione la opcin 4 o envenos un mensaje a travs de Pharmacist, community.   No podemos decirle cul ser su copago por los medicamentos por adelantado ya que esto es diferente dependiendo de la cobertura de su seguro. Sin embargo, es posible que podamos encontrar un medicamento sustituto a Electrical engineer un formulario para que el seguro cubra el medicamento que se considera necesario.   Si se requiere una autorizacin previa para que su compaa de seguros [place] su medicamento, por favor permtanos de 1 a 2 das hbiles para completar este proceso.  Los precios de los medicamentos varan con frecuencia dependiendo del Environmental consultant de dnde se surte la receta y alguna farmacias pueden ofrecer precios ms baratos.  El sitio web www.goodrx.com tiene cupones para medicamentos de Airline pilot. Los precios aqu no tienen en cuenta lo que podra costar con la ayuda del seguro (puede ser ms barato con su seguro), pero el sitio web puede darle el precio si no utiliz Research scientist (physical sciences).  - Puede imprimir el cupn correspondiente y llevarlo con su receta a la farmacia.  - Tambin puede pasar por nuestra oficina durante el horario de atencin regular y Charity fundraiser una tarjeta de cupones de GoodRx.  - Si necesita que su receta se enve electrnicamente a una farmacia diferente, informe a nuestra oficina a travs de MyChart de [place] o por telfono llamando al [phone] y presione la opcin 4.

## 2021-08-26 ENCOUNTER — Encounter: Payer: Self-pay | Admitting: Dermatology

## 2021-09-05 ENCOUNTER — Encounter: Payer: Self-pay | Admitting: Dermatology

## 2021-09-05 ENCOUNTER — Telehealth: Payer: Self-pay

## 2021-09-05 NOTE — Telephone Encounter (Signed)
Called pt discussed Mychart message about 5FU/Calcipotriene cream, pt instructed to stop 5FU/Calcipotriene cream and start plain Vaseline only, pt will call back if he has any problems

## 2022-02-27 ENCOUNTER — Ambulatory Visit (INDEPENDENT_AMBULATORY_CARE_PROVIDER_SITE_OTHER): Payer: Medicare Other | Admitting: Dermatology

## 2022-02-27 DIAGNOSIS — Z5111 Encounter for antineoplastic chemotherapy: Secondary | ICD-10-CM | POA: Diagnosis not present

## 2022-02-27 DIAGNOSIS — L810 Postinflammatory hyperpigmentation: Secondary | ICD-10-CM

## 2022-02-27 DIAGNOSIS — L820 Inflamed seborrheic keratosis: Secondary | ICD-10-CM

## 2022-02-27 DIAGNOSIS — Z79899 Other long term (current) drug therapy: Secondary | ICD-10-CM | POA: Diagnosis not present

## 2022-02-27 DIAGNOSIS — L578 Other skin changes due to chronic exposure to nonionizing radiation: Secondary | ICD-10-CM

## 2022-02-27 DIAGNOSIS — L570 Actinic keratosis: Secondary | ICD-10-CM

## 2022-02-27 NOTE — Progress Notes (Signed)
Follow-Up Visit   Subjective  Todd Farley is a 76 y.o. male who presents for the following: Actinic Keratosis (6 month follow up - scalp and forehead - used 5FU/Calcipotriene x ~4 days and got really irritated and was told to stop. Scalp seems more itchy now). The patient has spots, moles and lesions to be evaluated, some may be new or changing and the patient has concerns that these could be cancer.  The following portions of the chart were reviewed this encounter and updated as appropriate:   Tobacco  Allergies  Meds  Problems  Med Hx  Surg Hx  Fam Hx     Review of Systems:  No other skin or systemic complaints except as noted in HPI or Assessment and Plan.  Objective  Well appearing patient in no apparent distress; mood and affect are within normal limits.  A focused examination was performed including scalp, left leg. Relevant physical exam findings are noted in the Assessment and Plan.  Scalp (15) Erythematous thin papules/macules with gritty scale.   Scalp Erythematous stuck-on, waxy papule or plaque  Left Knee - Anterior Erythema   Assessment & Plan  AK (actinic keratosis) (15) Scalp  Actinic Damage with PreCancerous Actinic Keratoses Counseling for Topical Chemotherapy Management: Patient exhibits: - Severe, confluent actinic changes with pre-cancerous actinic keratoses that is secondary to cumulative UV radiation exposure over time - Condition that is severe; chronic, not at goal. - diffuse scaly erythematous macules and papules with underlying dyspigmentation - Discussed Prescription "Field Treatment" topical Chemotherapy for Severe, Chronic Confluent Actinic Changes with Pre-Cancerous Actinic Keratoses Field treatment involves treatment of an entire area of skin that has confluent Actinic Changes (Sun/ Ultraviolet light damage) and PreCancerous Actinic Keratoses by method of PhotoDynamic Therapy (PDT) and/or prescription Topical Chemotherapy agents such  as 5-fluorouracil, 5-fluorouracil/calcipotriene, and/or imiquimod.  The purpose is to decrease the number of clinically evident and subclinical PreCancerous lesions to prevent progression to development of skin cancer by chemically destroying early precancer changes that may or may not be visible.  It has been shown to reduce the risk of developing skin cancer in the treated area. As a result of treatment, redness, scaling, crusting, and open sores may occur during treatment course. One or more than one of these methods may be used and may have to be used several times to control, suppress and eliminate the PreCancerous changes. Discussed treatment course, expected reaction, and possible side effects. - Recommend daily broad spectrum sunscreen SPF 30+ to sun-exposed areas, reapply every 2 hours as needed.  - Staying in the shade or wearing long sleeves, sun glasses (UVA+UVB protection) and wide brim hats (4-inch brim around the entire circumference of the hat) are also recommended. - Call for new or changing lesions.   Starting March 31, 2022, restart Fluorouracil/calcipotriene cream to scalp x 4 days  Destruction of lesion - Scalp Complexity: simple   Destruction method: cryotherapy   Informed consent: discussed and consent obtained   Timeout:  patient name, date of birth, surgical site, and procedure verified Lesion destroyed using liquid nitrogen: Yes   Region frozen until ice ball extended beyond lesion: Yes   Outcome: patient tolerated procedure well with no complications   Post-procedure details: wound care instructions given    Related Medications fluorouracil (EFUDEX) 5 % cream Apply topically 2 (two) times daily. Bid for 7 days to scalp, forehead, temples  Inflamed seborrheic keratosis Scalp  Destruction of lesion - Scalp Complexity: simple   Destruction method: cryotherapy  Informed consent: discussed and consent obtained   Timeout:  patient name, date of birth, surgical site,  and procedure verified Lesion destroyed using liquid nitrogen: Yes   Region frozen until ice ball extended beyond lesion: Yes   Outcome: patient tolerated procedure well with no complications   Post-procedure details: wound care instructions given    Postinflammatory hyperpigmentation Left Knee - Anterior  Postinflammatory erythema secondary to injury from a fall - advised patient no treatment is needed and it will continue to improve over the next year. May use Serica gel scar treatment.   Return for 6-12 months , AK follow up.  I, Ashok Cordia, CMA, am acting as scribe for Sarina Ser, MD . Documentation: I have reviewed the above documentation for accuracy and completeness, and I agree with the above.  Sarina Ser, MD

## 2022-02-27 NOTE — Patient Instructions (Signed)
Postinflammatory erythema secondary to injury from a fall - advised patient no treatment is needed and it will continue to improve over the next year. May use Serica gel scar treatment.   Cryotherapy Aftercare  Wash gently with soap and water everyday.   Apply Vaseline and Band-Aid daily until healed.    Due to recent changes in healthcare laws, you may see results of your pathology and/or laboratory studies on MyChart before the doctors have had a chance to review them. We understand that in some cases there may be results that are confusing or concerning to you. Please understand that not all results are received at the same time and often the doctors may need to interpret multiple results in order to provide you with the best plan of care or course of treatment. Therefore, we ask that you please give [place] 2 business days to thoroughly review all your results before contacting the office for clarification. Should we see a critical lab result, you will be contacted sooner.   If You Need Anything After Your Visit  If you have any questions or concerns for your doctor, please call our main line at [phone] and press option 4 to reach your doctor's medical assistant. If no one answers, please leave a voicemail as directed and we will return your call as soon as possible. Messages left after 4 pm will be answered the following business day.   You may also send [place] a message via [place]. We typically respond to MyChart messages within 1-2 business days.  For prescription refills, please ask your pharmacy to contact our office. Our fax number is [phone].  If you have an urgent issue when the clinic is closed that cannot wait until the next business day, you can page your doctor at the number below.    Please note that while we do our best to be available for urgent issues outside of office hours, we are not available 24/7.   If you have an urgent issue and are unable to reach [place], you may  choose to seek medical care at your doctor's office, retail clinic, urgent care center, or emergency room.  If you have a medical emergency, please immediately call 911 or go to the emergency department.  Pager Numbers  - Dr. Nehemiah Massed: [phone]  - Dr. Laurence Ferrari: [phone]  - Dr. Nicole Kindred: [phone]  In the event of inclement weather, please call our main line at [phone] for an update on the status of any delays or closures.  Dermatology Medication Tips: Please keep the boxes that topical medications come in in order to help keep track of the instructions about where and how to use these. Pharmacies typically print the medication instructions only on the boxes and not directly on the medication tubes.   If your medication is too expensive, please contact our office at [phone] option 4 or send [place] a message through [place].   We are unable to tell what your co-pay for medications will be in advance as this is different depending on your insurance coverage. However, we may be able to find a substitute medication at lower cost or fill out paperwork to get insurance to cover a needed medication.   If a prior authorization is required to get your medication covered by your insurance company, please allow [place] 1-2 business days to complete this process.  Drug prices often vary depending on where the prescription is filled and some pharmacies may offer cheaper prices.  The website www.goodrx.com contains coupons  for medications through different pharmacies. The prices here do not account for what the cost may be with help from insurance (it may be cheaper with your insurance), but the website can give you the price if you did not use any insurance.  - You can print the associated coupon and take it with your prescription to the pharmacy.  - You may also stop by our office during regular business hours and pick up a GoodRx coupon card.  - If you need your prescription sent  electronically to a different pharmacy, notify our office through [facility] or by phone at [phone] option 4.     Si Usted Necesita Algo Despus de Su Visita  Tambin puede enviarnos un mensaje a travs de Pharmacist, community. Por lo general respondemos a los mensajes de MyChart en el transcurso de 1 a 2 das hbiles.  Para renovar recetas, por favor pida a su farmacia que se ponga en contacto con nuestra oficina. Harland Dingwall de fax es [place] [phone].  Si tiene un asunto urgente cuando la clnica est cerrada y que no puede esperar hasta el siguiente da hbil, puede llamar/localizar a su doctor(a) al nmero que aparece a continuacin.   Por favor, tenga en cuenta que aunque hacemos todo lo posible para estar disponibles para asuntos urgentes fuera del horario de [place], no estamos disponibles las 24 horas del da, los 7 das de la [place].   Si tiene un problema urgente y no puede comunicarse con nosotros, puede optar por buscar atencin mdica  en el consultorio de su doctor(a), en una clnica privada, en un centro de atencin urgente o en una sala de emergencias.  Si tiene Engineering geologist, por favor llame inmediatamente al 911 o vaya a la sala de emergencias.  Nmeros de bper  - Dr. Nehemiah Massed: [phone]  - Dra. Moye: [phone]  - Dra. Nicole Kindred: [phone]  En caso de inclemencias del [place], por favor llame a Johnsie Kindred principal al [phone] para una actualizacin sobre el [place] de cualquier retraso o cierre.  Consejos para la medicacin en dermatologa: Por favor, guarde las cajas en las que vienen los medicamentos de uso tpico para ayudarle a seguir las instrucciones sobre dnde y cmo usarlos. Las farmacias generalmente imprimen las instrucciones del medicamento slo en las cajas y no directamente en los tubos del [place].   Si su medicamento es muy caro, por favor, pngase en contacto con Zigmund Daniel llamando al [phone] y presione la  opcin 4 o envenos un mensaje a travs de Pharmacist, community.   No podemos decirle cul ser su copago por los medicamentos por adelantado ya que esto es diferente dependiendo de la cobertura de su seguro. Sin embargo, es posible que podamos encontrar un medicamento sustituto a Electrical engineer un formulario para que el seguro cubra el medicamento que se considera necesario.   Si se requiere una autorizacin previa para que su compaa de seguros [place] su medicamento, por favor permtanos de 1 a 2 das hbiles para completar este proceso.  Los precios de los medicamentos varan con frecuencia dependiendo del Environmental consultant de dnde se surte la receta y alguna farmacias pueden ofrecer precios ms baratos.  El sitio web www.goodrx.com tiene cupones para medicamentos de Airline pilot. Los precios aqu no tienen en cuenta lo que podra costar con la ayuda del seguro (puede ser ms barato con su seguro), pero el sitio web puede darle el precio si no utiliz Research scientist (physical sciences).  - Puede imprimir el cupn correspondiente  y llevarlo con su receta a la farmacia.  - Tambin puede pasar por nuestra oficina durante el horario de atencin regular y Charity fundraiser una tarjeta de cupones de GoodRx.  - Si necesita que su receta se enve electrnicamente a una farmacia diferente, informe a nuestra oficina a travs de MyChart de [place] o por telfono llamando al [phone] y presione la opcin 4.

## 2022-03-15 ENCOUNTER — Encounter: Payer: Self-pay | Admitting: Dermatology

## 2022-08-27 ENCOUNTER — Ambulatory Visit (INDEPENDENT_AMBULATORY_CARE_PROVIDER_SITE_OTHER): Payer: Medicare Other | Admitting: Dermatology

## 2022-08-27 VITALS — BP 138/63

## 2022-08-27 DIAGNOSIS — L578 Other skin changes due to chronic exposure to nonionizing radiation: Secondary | ICD-10-CM

## 2022-08-27 DIAGNOSIS — Z79899 Other long term (current) drug therapy: Secondary | ICD-10-CM

## 2022-08-27 DIAGNOSIS — Z5111 Encounter for antineoplastic chemotherapy: Secondary | ICD-10-CM

## 2022-08-27 DIAGNOSIS — X32XXXA Exposure to sunlight, initial encounter: Secondary | ICD-10-CM | POA: Diagnosis not present

## 2022-08-27 DIAGNOSIS — L905 Scar conditions and fibrosis of skin: Secondary | ICD-10-CM

## 2022-08-27 DIAGNOSIS — L570 Actinic keratosis: Secondary | ICD-10-CM

## 2022-08-27 DIAGNOSIS — W908XXA Exposure to other nonionizing radiation, initial encounter: Secondary | ICD-10-CM

## 2022-08-27 DIAGNOSIS — Z7189 Other specified counseling: Secondary | ICD-10-CM

## 2022-08-27 DIAGNOSIS — L821 Other seborrheic keratosis: Secondary | ICD-10-CM

## 2022-08-27 NOTE — Progress Notes (Signed)
Follow-Up Visit   Subjective  Todd Farley is a 77 y.o. male who presents for the following: AK of scalp - 6 month follow up - treated with LN2. He did not use 5FU/Calcipotriene cream yet. The patient has spots, moles and lesions to be evaluated, some may be new or changing and the patient may have concern these could be cancer.  The following portions of the chart were reviewed this encounter and updated as appropriate: medications, allergies, medical history  Review of Systems:  No other skin or systemic complaints except as noted in HPI or Assessment and Plan.  Objective  Well appearing patient in no apparent distress; mood and affect are within normal limits. A focused examination was performed of the following areas: face, scalp Relevant exam findings are noted in the Assessment and Plan.  Scalp (4) Erythematous thin papules/macules with gritty scale.    Assessment & Plan   HISTORY OF RECENT BROW LIFT WITH DR RUBINSTEIN Exam: Scars of bilateral forehead   AK (actinic keratosis) (4) Scalp  ACTINIC DAMAGE WITH PRECANCEROUS ACTINIC KERATOSES Counseling for Topical Chemotherapy Management: Patient exhibits: - Severe, confluent actinic changes with pre-cancerous actinic keratoses that is secondary to cumulative UV radiation exposure over time - Condition that is severe; chronic, not at goal. - diffuse scaly erythematous macules and papules with underlying dyspigmentation - Discussed Prescription "Field Treatment" topical Chemotherapy for Severe, Chronic Confluent Actinic Changes with Pre-Cancerous Actinic Keratoses Field treatment involves treatment of an entire area of skin that has confluent Actinic Changes (Sun/ Ultraviolet light damage) and PreCancerous Actinic Keratoses by method of PhotoDynamic Therapy (PDT) and/or prescription Topical Chemotherapy agents such as 5-fluorouracil, 5-fluorouracil/calcipotriene, and/or imiquimod.  The purpose is to decrease the number of  clinically evident and subclinical PreCancerous lesions to prevent progression to development of skin cancer by chemically destroying early precancer changes that may or may not be visible.  It has been shown to reduce the risk of developing skin cancer in the treated area. As a result of treatment, redness, scaling, crusting, and open sores may occur during treatment course. One or more than one of these methods may be used and may have to be used several times to control, suppress and eliminate the PreCancerous changes. Discussed treatment course, expected reaction, and possible side effects. - Recommend daily broad spectrum sunscreen SPF 30+ to sun-exposed areas, reapply every 2 hours as needed.  - Staying in the shade or wearing long sleeves, sun glasses (UVA+UVB protection) and wide brim hats (4-inch brim around the entire circumference of the hat) are also recommended. - Call for new or changing lesions.  Start Fluorouracil 5%/Calcipotriene cream once back from [place] - twice a day for 7 days to scalp  Destruction of lesion - Scalp Complexity: simple   Destruction method: cryotherapy   Informed consent: discussed and consent obtained   Timeout:  patient name, date of birth, surgical site, and procedure verified Lesion destroyed using liquid nitrogen: Yes   Region frozen until ice ball extended beyond lesion: Yes   Outcome: patient tolerated procedure well with no complications   Post-procedure details: wound care instructions given    Related Medications fluorouracil (EFUDEX) 5 % cream Apply topically 2 (two) times daily. Bid for 7 days to scalp, forehead, temples  SEBORRHEIC KERATOSIS - Stuck-on, waxy, tan-brown papules and/or plaques  - Benign-appearing - Discussed benign etiology and prognosis. - Observe - Call for any changes  Return in about 5 months (around 01/27/2023) for AK follow up.  I,  Joanie Coddington, CMA, am acting as scribe for Armida Sans, MD .  Documentation: I  have reviewed the above documentation for accuracy and completeness, and I agree with the above.  Armida Sans, MD

## 2022-08-27 NOTE — Patient Instructions (Signed)
Start Fluorouracil 5%/Calcipotriene cream once back from [place] - twice a day for 7 days to scalp   Cryotherapy Aftercare  Wash gently with soap and water everyday.   Apply Vaseline and Band-Aid daily until healed.   Due to recent changes in healthcare laws, you may see results of your pathology and/or laboratory studies on MyChart before the doctors have had a chance to review them. We understand that in some cases there may be results that are confusing or concerning to you. Please understand that not all results are received at the same time and often the doctors may need to interpret multiple results in order to provide you with the best plan of care or course of treatment. Therefore, we ask that you please give [place] 2 business days to thoroughly review all your results before contacting the office for clarification. Should we see a critical lab result, you will be contacted sooner.   If You Need Anything After Your Visit  If you have any questions or concerns for your doctor, please call our main line at [phone] and press option 4 to reach your doctor's medical assistant. If no one answers, please leave a voicemail as directed and we will return your call as soon as possible. Messages left after 4 pm will be answered the following business day.   You may also send [place] a message via MyChart. We typically respond to MyChart messages within 1-2 business days.  For prescription refills, please ask your pharmacy to contact our office. Our fax number is [phone].  If you have an urgent issue when the clinic is closed that cannot wait until the next business day, you can page your doctor at the number below.    Please note that while we do our best to be available for urgent issues outside of office hours, we are not available 24/7.   If you have an urgent issue and are unable to reach [place], you may choose to seek medical care at your doctor's office, retail clinic, urgent care center,  or emergency room.  If you have a medical emergency, please immediately call 911 or go to the emergency department.  Pager Numbers  - Dr. Gwen Pounds: [phone]  - Dr. Neale Burly: [phone]  - Dr. Roseanne Reno: [phone]  In the event of inclement weather, please call our main line at [phone] for an update on the status of any delays or closures.  Dermatology Medication Tips: Please keep the boxes that topical medications come in in order to help keep track of the instructions about where and how to use these. Pharmacies typically print the medication instructions only on the boxes and not directly on the medication tubes.   If your medication is too expensive, please contact our office at [phone] option 4 or send [place] a message through MyChart.   We are unable to tell what your co-pay for medications will be in advance as this is different depending on your insurance coverage. However, we may be able to find a substitute medication at lower cost or fill out paperwork to get insurance to cover a needed medication.   If a prior authorization is required to get your medication covered by your insurance company, please allow [place] 1-2 business days to complete this process.  Drug prices often vary depending on where the prescription is filled and some pharmacies may offer cheaper prices.  The website www.goodrx.com contains coupons for medications through different pharmacies. The prices here do not account for what the cost  may be with help from insurance (it may be cheaper with your insurance), but the website can give you the price if you did not use any insurance.  - You can print the associated coupon and take it with your prescription to the pharmacy.  - You may also stop by our office during regular business hours and pick up a GoodRx coupon card.  - If you need your prescription sent electronically to a different pharmacy, notify our office through [facility] or by phone at  [phone] option 4.     Si Usted Necesita Algo Despus de Su Visita  Tambin puede enviarnos un mensaje a travs de Clinical cytogeneticist. Por lo general respondemos a los mensajes de MyChart en el transcurso de 1 a 2 das hbiles.  Para renovar recetas, por favor pida a su farmacia que se ponga en contacto con nuestra oficina. Annie Sable de fax es [place] [phone].  Si tiene un asunto urgente cuando la clnica est cerrada y que no puede esperar hasta el siguiente da hbil, puede llamar/localizar a su doctor(a) al nmero que aparece a continuacin.   Por favor, tenga en cuenta que aunque hacemos todo lo posible para estar disponibles para asuntos urgentes fuera del horario de [place], no estamos disponibles las 24 horas del da, los 7 [address]  [address] de la [place].   Si tiene un problema urgente y no puede comunicarse con nosotros, puede optar por buscar atencin mdica  en el consultorio de su doctor(a), en una clnica privada, en un centro de atencin urgente o en una sala de emergencias.  Si tiene Engineer, drilling, por favor llame inmediatamente al 911 o vaya a la sala de emergencias.  Nmeros de bper  - Dr. Gwen Pounds: [phone]  - Dra. Moye: [phone]  - Dra. Roseanne Reno: [phone]  En caso de inclemencias del [place], por favor llame a Lacy Duverney principal al [phone] para una actualizacin sobre el [place] de cualquier retraso o cierre.  Consejos para la medicacin en dermatologa: Por favor, guarde las cajas en las que vienen los medicamentos de uso tpico para ayudarle a seguir las instrucciones sobre dnde y cmo usarlos. Las farmacias generalmente imprimen las instrucciones del medicamento slo en las cajas y no directamente en los tubos del [place].   Si su medicamento es muy caro, por favor, pngase en contacto con Rolm Gala llamando al [phone] y presione la opcin 4 o envenos un mensaje a travs de Clinical cytogeneticist.   No podemos decirle cul ser su copago por los  medicamentos por adelantado ya que esto es diferente dependiendo de la cobertura de su seguro. Sin embargo, es posible que podamos encontrar un medicamento sustituto a Audiological scientist un formulario para que el seguro cubra el medicamento que se considera necesario.   Si se requiere una autorizacin previa para que su compaa de seguros [place] su medicamento, por favor permtanos de 1 a 2 das hbiles para completar [address]  Los precios de los medicamentos varan con frecuencia dependiendo del Environmental consultant de dnde se surte la receta y alguna farmacias pueden ofrecer precios ms baratos.  El sitio web www.goodrx.com tiene cupones para medicamentos de Health and safety inspector. Los precios aqu no tienen en cuenta lo que podra costar con la ayuda del seguro (puede ser ms barato con su seguro), pero el sitio web puede darle el precio si no utiliz Tourist information centre manager.  - Puede imprimir el cupn correspondiente y llevarlo con su receta a la farmacia.  - Tambin puede pasar por [place]ne Co[place]  oficina durante el horario de atencin regular y Education officer, museum una tarjeta de cupones de GoodRx.  - Si necesita que su receta se enve electrnicamente a una farmacia diferente, informe a nuestra oficina a travs de MyChart de [place] o por telfono llamando al [phone] y presione la opcin 4.

## 2022-09-03 ENCOUNTER — Encounter: Payer: Self-pay | Admitting: Dermatology

## 2022-09-09 ENCOUNTER — Encounter: Payer: Self-pay | Admitting: Intensive Care

## 2022-09-09 ENCOUNTER — Other Ambulatory Visit: Payer: Self-pay

## 2022-09-09 ENCOUNTER — Emergency Department
Admission: EM | Admit: 2022-09-09 | Discharge: 2022-09-09 | Disposition: A | Discharge status: Home / Self Care | Payer: Medicare Other | Attending: Emergency Medicine | Admitting: Emergency Medicine

## 2022-09-09 ENCOUNTER — Emergency Department: Payer: Medicare Other

## 2022-09-09 DIAGNOSIS — S0083XA Contusion of other part of head, initial encounter: Secondary | ICD-10-CM | POA: Diagnosis not present

## 2022-09-09 DIAGNOSIS — W010XXA Fall on same level from slipping, tripping and stumbling without subsequent striking against object, initial encounter: Secondary | ICD-10-CM | POA: Insufficient documentation

## 2022-09-09 DIAGNOSIS — I10 Essential (primary) hypertension: Secondary | ICD-10-CM | POA: Insufficient documentation

## 2022-09-09 DIAGNOSIS — S61012A Laceration without foreign body of left thumb without damage to nail, initial encounter: Secondary | ICD-10-CM | POA: Diagnosis not present

## 2022-09-09 DIAGNOSIS — S0181XA Laceration without foreign body of other part of head, initial encounter: Secondary | ICD-10-CM

## 2022-09-09 DIAGNOSIS — W19XXXA Unspecified fall, initial encounter: Secondary | ICD-10-CM

## 2022-09-09 DIAGNOSIS — E119 Type 2 diabetes mellitus without complications: Secondary | ICD-10-CM | POA: Diagnosis not present

## 2022-09-09 HISTORY — DX: Polyneuropathy, unspecified: G62.9

## 2022-09-09 HISTORY — DX: Pure hypercholesterolemia, unspecified: E78.00

## 2022-09-09 MED ORDER — CEPHALEXIN 500 MG PO CAPS: 500.0000 mg | ORAL_CAPSULE | Freq: Three times a day (TID) | ORAL | 0 refills | Status: DC

## 2022-09-09 MED ORDER — LIDOCAINE HCL (PF) 1 % IJ SOLN
15.0000 mL | Freq: Once | INTRAMUSCULAR | Status: AC
  Administered 2022-09-09: 15 mL
  Filled 2022-09-09: qty 15

## 2022-09-09 NOTE — ED Provider Notes (Signed)
[facility] Provider Note    Event Date/Time   First MD Initiated Contact with Patient 09/09/22 0845     (approximate)   History   Fall   HPI  Todd Farley is a 77 y.o. male presents to the ED from home after he fell taking his garbage container out.  Patient states that he fell striking his right forehead on the pavement but believes that the laceration to his left thumb was due to holding the trash container.  Patient reports that he had a tetanus update in December 2023.  Patient denies any loss of consciousness or visual changes.  Patient cleaned the area at home but was unable to control some continued bleeding.  Patient has history of hypertension, diabetes, GERD.     Physical Exam   Triage Vital Signs: ED Triage Vitals [09/09/22 0823]  Enc Vitals Group     BP 137/67     Pulse Rate 72     Resp 18     Temp 98 F (36.7 C)     Temp Source Oral     SpO2 100 %     Weight 206 lb (93.4 kg)     Height 6' 1.5" (1.867 m)     Head Circumference      Peak Flow      Pain Score 0     Pain Loc      Pain Edu?      Excl. in GC?     Most recent vital signs: Vitals:   09/09/22 0823  BP: 137/67  Pulse: 72  Resp: 18  Temp: 98 F (36.7 C)  SpO2: 100%     General: Awake, no distress.  Alert, talkative, oriented x 3.  Patient is able to stand and ambulate without any assistance. CV:  Good peripheral perfusion.  Heart regular rate and rhythm. Resp:  Normal effort.  Clear bilaterally. Abd:  No distention.  Soft, nontender. Other:  Abrasion/laceration to the right forehead.  No active bleeding at this time.  Also examination of the left hand there is a small laceration noted to the DIP joint dorsal aspect with minimal bleeding.  Motor or sensory function intact and range of motion is without restriction.  Capillary refill is less than 3 seconds.  No tenderness or pain with palpation of the lower extremities including the pelvis.   ED Results /  Procedures / Treatments   Labs (all labs ordered are listed, but only abnormal results are displayed) Labs Reviewed - No data to display    RADIOLOGY CT head and cervical spine per radiologist are negative for acute intracranial injury or cervical malalignment or fracture.    PROCEDURES:  Critical Care performed:   Marland KitchenMarland KitchenLaceration Repair  Date/Time: 09/09/2022 10:19 AM  Performed by: Tommi Rumps, PA-C Authorized by: Tommi Rumps, PA-C   Consent:    Consent obtained:  Verbal   Consent given by:  Patient   Risks discussed:  Infection and poor wound healing Universal protocol:    Patient identity confirmed:  Verbally with patient Anesthesia:    Anesthesia method:  Local infiltration   Local anesthetic:  Lidocaine 1% w/o epi Laceration details:    Location:  Face   Face location:  Forehead   Length (cm):  3 Pre-procedure details:    Preparation:  Patient was prepped and draped in usual sterile fashion and imaging obtained to evaluate for foreign bodies Exploration:    Limited defect created (wound extended): no  Hemostasis achieved with:  Direct pressure   Imaging outcome: foreign body not noted     Contaminated: no   Treatment:    Area cleansed with:  Saline   Amount of cleaning:  Standard   Irrigation solution:  Sterile saline   Irrigation volume:  120 ml   Irrigation method:  Syringe   Visualized foreign bodies/material removed: no     Layers/structures repaired:  Deep dermal/superficial fascia Deep dermal/superficial fascia:    Suture size:  5-0   Suture material:  Vicryl   Suture technique:  Simple interrupted   Number of sutures:  3 Skin repair:    Repair method:  Sutures   Suture size:  6-0   Suture material:  Nylon   Suture technique:  Simple interrupted   Number of sutures:  7 Approximation:    Approximation:  Loose Repair type:    Repair type:  Simple Post-procedure details:    Dressing:  Non-adherent dressing   Procedure completion:   Tolerated .Marland KitchenLaceration Repair  Date/Time: 09/09/2022 11:39 AM  Performed by: Tommi Rumps, PA-C Authorized by: Tommi Rumps, PA-C   Consent:    Consent obtained:  Verbal   Consent given by:  Patient   Risks discussed:  Infection and poor wound healing Universal protocol:    Patient identity confirmed:  Verbally with patient Anesthesia:    Anesthesia method:  Local infiltration   Local anesthetic:  Lidocaine 1% w/o epi Laceration details:    Location:  Finger   Finger location:  L thumb   Length (cm):  1.5 Pre-procedure details:    Preparation:  Patient was prepped and draped in usual sterile fashion Exploration:    Hemostasis achieved with:  Direct pressure   Contaminated: no   Treatment:    Area cleansed with:  Saline   Amount of cleaning:  Standard   Irrigation solution:  Sterile saline   Irrigation volume:  60 ml   Irrigation method:  Syringe   Visualized foreign bodies/material removed: no   Skin repair:    Repair method:  Sutures   Suture size:  5-0   Suture material:  Nylon   Suture technique:  Simple interrupted   Number of sutures:  2 Approximation:    Approximation:  Close Repair type:    Repair type:  Simple Post-procedure details:    Dressing:  Non-adherent dressing   Procedure completion:  Tolerated    MEDICATIONS ORDERED IN ED: Medications  lidocaine (PF) (XYLOCAINE) 1 % injection 15 mL (15 mLs Infiltration Given by Other 09/09/22 1024)     IMPRESSION / MDM / ASSESSMENT AND PLAN / ED COURSE  I reviewed the triage vital signs and the nursing notes.   Differential diagnosis includes, but is not limited to, intracranial injury, skull fracture, laceration right forehead, cervical injury, laceration left thumb.  77 year old male presents to the ED after a fall when he was carrying his trash container to the curb.  Patient is laceration to the right side of his forehead and also laceration to his left thumb.  Imaging of his head and cervical  spine per CT were negative and patient was reassured.  Laceration to both forehead and left thumb required sutures and patient was given information on how to care for this.  He was also placed on Keflex 500 mg 3 times daily for 7 days prophylactically.  He is aware that sutures will need to be removed and can come to an urgent care, emergency department or to his  PCP for this.  He is to return sooner if any signs of infection.      Patient's presentation is most consistent with acute presentation with potential threat to life or bodily function.  FINAL CLINICAL IMPRESSION(S) / ED DIAGNOSES   Final diagnoses:  Facial laceration, initial encounter  Contusion of face, initial encounter  Thumb laceration, left, initial encounter  Fall in home, initial encounter     Rx / DC Orders   ED Discharge Orders          Ordered    cephALEXin (KEFLEX) 500 MG capsule  3 times daily        09/09/22 1104             Note:  This document was prepared using Dragon voice recognition software and may include unintentional dictation errors.   Tommi Rumps, PA-C 09/09/22 1209    Minna Antis, MD 09/09/22 1428

## 2022-09-09 NOTE — ED Notes (Signed)
See triage note  States he tripped on gravel  Abrasions noted to right knee and arm  Laceration noted to left thumb and forehead  Denies any LOC

## 2022-09-09 NOTE — Discharge Instructions (Signed)
Follow-up with your primary care provider or urgent care for suture removal.  The sutures in your face will need to come out in approximately 7 days.  Watch the area for any signs of infection.  Clean daily with mild soap and water and allowed to dry completely.  A prescription for cephalexin was sent to the pharmacy to help prevent infection.  The sutures in your left thumb will need to be removed in approximately 10 to 12 days.  This area also needs to be cleaned daily and watch for any signs of infection.  You may take Tylenol if needed for pain.  Elevate your hand if needed for swelling.  Follow-up sooner if any signs of infection.

## 2022-09-09 NOTE — ED Triage Notes (Signed)
Arrives from [facility] for evaluation for c/o fall this morning.  Patient fell while taking trash out.  Per report, laceration to left forehead.  No LOC.  Patient not on thinners.  AAOx3.  Skin warm and dry. NAD

## 2022-12-04 ENCOUNTER — Other Ambulatory Visit: Payer: Self-pay | Admitting: Physician Assistant

## 2022-12-04 DIAGNOSIS — J40 Bronchitis, not specified as acute or chronic: Secondary | ICD-10-CM

## 2022-12-05 ENCOUNTER — Ambulatory Visit
Admission: RE | Admit: 2022-12-05 | Discharge: 2022-12-05 | Disposition: A | Discharge status: Home / Self Care | Payer: Medicare Other | Source: Ambulatory Visit | Attending: Physician Assistant | Admitting: Physician Assistant

## 2022-12-05 DIAGNOSIS — J40 Bronchitis, not specified as acute or chronic: Secondary | ICD-10-CM | POA: Insufficient documentation

## 2022-12-05 MED ORDER — IOHEXOL 300 MG/ML  SOLN
75.0000 mL | Freq: Once | INTRAMUSCULAR | Status: AC | PRN
  Administered 2022-12-05: 75 mL via INTRAVENOUS

## 2023-01-07 ENCOUNTER — Encounter
Admission: RE | Admit: 2023-01-07 | Discharge: 2023-01-07 | Disposition: A | Discharge status: Home / Self Care | Payer: Medicare Other | Source: Ambulatory Visit | Attending: Pulmonary Disease | Admitting: Pulmonary Disease

## 2023-01-07 ENCOUNTER — Other Ambulatory Visit: Payer: Self-pay

## 2023-01-07 DIAGNOSIS — E119 Type 2 diabetes mellitus without complications: Secondary | ICD-10-CM

## 2023-01-07 HISTORY — DX: Other diseases of bronchus, not elsewhere classified: J98.09

## 2023-01-07 HISTORY — DX: Personal history of urinary calculi: Z87.442

## 2023-01-07 HISTORY — DX: Malignant (primary) neoplasm, unspecified: C80.1

## 2023-01-07 HISTORY — DX: Umbilical hernia without obstruction or gangrene: K42.9

## 2023-01-07 HISTORY — DX: Anemia, unspecified: D64.9

## 2023-01-07 HISTORY — DX: Unspecified osteoarthritis, unspecified site: M19.90

## 2023-01-07 NOTE — Patient Instructions (Addendum)
Your procedure is scheduled on: 01/16/23 - FRIDAY Report to the Registration Desk on the 1st floor of the [facility]. To find out your arrival time, please call [phone] between 1PM - 3PM on: 01/15/23 - THURSDAY If your arrival time is 6:00 am, do not arrive before that time as the [facility] entrance doors do not open until 6:00 am.  REMEMBER: Instructions that are not followed completely may result in serious medical risk, up to and including death; or upon the discretion of your surgeon and anesthesiologist your surgery may need to be rescheduled.  Do not eat food OR DRINK ANY LIQUIDS after midnight the night before surgery.  No gum chewing or hard candies.    One week prior to surgery:  - STOP Anti-inflammatories (NSAIDS) such as Advil, Aleve, Ibuprofen, Motrin, Naproxen, Naprosyn and Aspirin based products such as Excedrin, Goody's Powder, BC Powder. You may take Tylenol if needed for pain up until the day of surgery.  - STOP ANY OVER THE COUNTER supplements until after surgery.  - STOP metFORMIN (GLUCOPHAGE) BEGINNING 01/14/23.   ON THE DAY OF SURGERY ONLY TAKE THESE MEDICATIONS WITH SIPS OF WATER:  omeprazole (PRILOSEC)  simvastatin (ZOCOR)    No Alcohol for 24 hours before or after surgery.  No Smoking including e-cigarettes for 24 hours before surgery.  No chewable tobacco products for at least 6 hours before surgery.  No nicotine patches on the day of surgery.  Do not use any "recreational" drugs for at least a week (preferably 2 weeks) before your surgery.  Please be advised that the combination of cocaine and anesthesia may have negative outcomes, up to and including death. If you test positive for cocaine, your surgery will be cancelled.  On the morning of surgery brush your teeth with toothpaste and water, you may rinse your mouth with mouthwash if you wish. Do not swallow any toothpaste or mouthwash.  Do not wear jewelry, make-up, hairpins, clips or  nail polish.  For welded (permanent) jewelry: bracelets, anklets, waist bands, etc.  Please have this removed prior to surgery.  If it is not removed, there is a chance that hospital personnel will need to cut it off on the day of surgery.  Do not wear lotions, powders, or perfumes.   Do not shave body hair from the neck down 48 hours before surgery.  Contact lenses, hearing aids and dentures may not be worn into surgery.  Do not bring valuables to the hospital. [facility] is not responsible for any missing/lost belongings or valuables.   Notify your doctor if there is any change in your medical condition (cold, fever, infection).  Wear comfortable clothing (specific to your surgery type) to the hospital.  After surgery, you can help prevent lung complications by doing breathing exercises.  Take deep breaths and cough every 1-2 hours. Your doctor may order a device called an Incentive Spirometer to help you take deep breaths. When coughing or sneezing, hold a pillow firmly against your incision with both hands. This is called "splinting." Doing this helps protect your incision. It also decreases belly discomfort.  If you are being admitted to the hospital overnight, leave your suitcase in the car. After surgery it may be brought to your room.  In case of increased patient census, it may be necessary for you, the patient, to continue your postoperative care in the Same Day Surgery department.  If you are being discharged the day of surgery, you will not be allowed to  drive home. You will need a responsible individual to drive you home and stay with you for 24 hours after surgery.   If you are taking public transportation, you will need to have a responsible individual with you.  Please call the Pre-admissions Testing Dept. at [phone] if you have any questions about these instructions.  Surgery Visitation Policy:  Patients having surgery or a procedure may have two visitors.   Children under the age of 52 must have an adult with them who is not the patient.  Inpatient Visitation:    Visiting hours are 7 a.m. to 8 p.m. Up to four visitors are allowed at one time in a patient room. The visitors may rotate out with other people during the day.  One visitor age 38 or older may stay with the patient overnight and must be in the room by 8 p.m.

## 2023-01-08 ENCOUNTER — Encounter
Admission: RE | Admit: 2023-01-08 | Discharge: 2023-01-08 | Disposition: A | Discharge status: Home / Self Care | Payer: Medicare Other | Source: Ambulatory Visit | Attending: Pulmonary Disease | Admitting: Pulmonary Disease

## 2023-01-08 DIAGNOSIS — Z01810 Encounter for preprocedural cardiovascular examination: Secondary | ICD-10-CM | POA: Diagnosis not present

## 2023-01-08 DIAGNOSIS — Z7901 Long term (current) use of anticoagulants: Secondary | ICD-10-CM | POA: Insufficient documentation

## 2023-01-08 DIAGNOSIS — R0602 Shortness of breath: Secondary | ICD-10-CM | POA: Diagnosis not present

## 2023-01-08 DIAGNOSIS — Z01818 Encounter for other preprocedural examination: Secondary | ICD-10-CM | POA: Diagnosis present

## 2023-01-08 DIAGNOSIS — I10 Essential (primary) hypertension: Secondary | ICD-10-CM | POA: Insufficient documentation

## 2023-01-08 DIAGNOSIS — Z01812 Encounter for preprocedural laboratory examination: Secondary | ICD-10-CM

## 2023-01-08 LAB — CBC
HCT: 39.8 % (ref 39.0–52.0)
Hemoglobin: 12.6 g/dL — ABNORMAL LOW (ref 13.0–17.0)
MCH: 29.6 pg (ref 26.0–34.0)
MCHC: 31.7 g/dL (ref 30.0–36.0)
MCV: 93.6 fL (ref 80.0–100.0)
Platelets: 316 10*3/uL (ref 150–400)
RBC: 4.25 MIL/uL (ref 4.22–5.81)
RDW: 15.2 % (ref 11.5–15.5)
WBC: 6.1 10*3/uL (ref 4.0–10.5)
nRBC: 0 % (ref 0.0–0.2)

## 2023-01-08 LAB — PROTIME-INR
INR: 1 (ref 0.8–1.2)
Prothrombin Time: 13.7 s (ref 11.4–15.2)

## 2023-01-08 LAB — APTT: aPTT: 27 s (ref 24–36)

## 2023-01-16 ENCOUNTER — Other Ambulatory Visit: Payer: Self-pay

## 2023-01-16 ENCOUNTER — Ambulatory Visit: Payer: Medicare Other

## 2023-01-16 ENCOUNTER — Ambulatory Visit: Payer: Medicare Other | Admitting: Registered Nurse

## 2023-01-16 ENCOUNTER — Ambulatory Visit: Payer: Medicare Other | Admitting: Urgent Care

## 2023-01-16 ENCOUNTER — Ambulatory Visit
Admission: RE | Admit: 2023-01-16 | Discharge: 2023-01-16 | Disposition: A | Discharge status: Home / Self Care | Payer: Medicare Other | Attending: Pulmonary Disease | Admitting: Pulmonary Disease

## 2023-01-16 ENCOUNTER — Encounter
Admission: RE | Disposition: A | Discharge status: Home / Self Care | Payer: Self-pay | Source: Home / Self Care | Attending: Pulmonary Disease

## 2023-01-16 DIAGNOSIS — E1140 Type 2 diabetes mellitus with diabetic neuropathy, unspecified: Secondary | ICD-10-CM | POA: Insufficient documentation

## 2023-01-16 DIAGNOSIS — Z87442 Personal history of urinary calculi: Secondary | ICD-10-CM | POA: Diagnosis not present

## 2023-01-16 DIAGNOSIS — Z87891 Personal history of nicotine dependence: Secondary | ICD-10-CM | POA: Insufficient documentation

## 2023-01-16 DIAGNOSIS — M1990 Unspecified osteoarthritis, unspecified site: Secondary | ICD-10-CM | POA: Diagnosis not present

## 2023-01-16 DIAGNOSIS — J8410 Pulmonary fibrosis, unspecified: Secondary | ICD-10-CM | POA: Diagnosis not present

## 2023-01-16 DIAGNOSIS — E119 Type 2 diabetes mellitus without complications: Secondary | ICD-10-CM

## 2023-01-16 DIAGNOSIS — I10 Essential (primary) hypertension: Secondary | ICD-10-CM | POA: Diagnosis not present

## 2023-01-16 DIAGNOSIS — D649 Anemia, unspecified: Secondary | ICD-10-CM | POA: Diagnosis not present

## 2023-01-16 DIAGNOSIS — J9809 Other diseases of bronchus, not elsewhere classified: Secondary | ICD-10-CM | POA: Insufficient documentation

## 2023-01-16 DIAGNOSIS — Z9889 Other specified postprocedural states: Secondary | ICD-10-CM | POA: Insufficient documentation

## 2023-01-16 DIAGNOSIS — Z85828 Personal history of other malignant neoplasm of skin: Secondary | ICD-10-CM | POA: Insufficient documentation

## 2023-01-16 DIAGNOSIS — Z79899 Other long term (current) drug therapy: Secondary | ICD-10-CM | POA: Diagnosis not present

## 2023-01-16 DIAGNOSIS — Z7984 Long term (current) use of oral hypoglycemic drugs: Secondary | ICD-10-CM | POA: Diagnosis not present

## 2023-01-16 DIAGNOSIS — K219 Gastro-esophageal reflux disease without esophagitis: Secondary | ICD-10-CM | POA: Diagnosis not present

## 2023-01-16 HISTORY — PX: FLEXIBLE BRONCHOSCOPY: SHX5094

## 2023-01-16 HISTORY — PX: BRONCHIAL WASHINGS: SHX5105

## 2023-01-16 LAB — GLUCOSE, CAPILLARY
Glucose-Capillary: 139 mg/dL — ABNORMAL HIGH (ref 70–99)
Glucose-Capillary: 133 mg/dL — ABNORMAL HIGH (ref 70–99)

## 2023-01-16 SURGERY — IRRIGATION, BRONCHUS
Anesthesia: General

## 2023-01-16 MED ORDER — GLYCOPYRROLATE 0.2 MG/ML IJ SOLN
INTRAMUSCULAR | Status: DC | PRN
  Administered 2023-01-16: .2 mg via INTRAVENOUS

## 2023-01-16 MED ORDER — FENTANYL CITRATE (PF) 100 MCG/2ML IJ SOLN
INTRAMUSCULAR | Status: DC | PRN
  Administered 2023-01-16 (×2): 25 ug via INTRAVENOUS

## 2023-01-16 MED ORDER — FENTANYL CITRATE (PF) 100 MCG/2ML IJ SOLN
INTRAMUSCULAR | Status: AC
  Filled 2023-01-16: qty 2

## 2023-01-16 MED ORDER — MIDAZOLAM HCL 2 MG/2ML IJ SOLN
INTRAMUSCULAR | Status: DC | PRN
  Administered 2023-01-16: 2 mg via INTRAVENOUS

## 2023-01-16 MED ORDER — SUCCINYLCHOLINE CHLORIDE 200 MG/10ML IV SOSY
PREFILLED_SYRINGE | INTRAVENOUS | Status: DC | PRN
  Administered 2023-01-16: 100 mg via INTRAVENOUS

## 2023-01-16 MED ORDER — ORAL CARE MOUTH RINSE: 15.0000 mL | Freq: Once | OROMUCOSAL | Status: AC

## 2023-01-16 MED ORDER — LIDOCAINE HCL (CARDIAC) PF 100 MG/5ML IV SOSY
PREFILLED_SYRINGE | INTRAVENOUS | Status: DC | PRN
  Administered 2023-01-16: 100 mg via INTRAVENOUS

## 2023-01-16 MED ORDER — PROPOFOL 10 MG/ML IV BOLUS
INTRAVENOUS | Status: AC
  Filled 2023-01-16: qty 20

## 2023-01-16 MED ORDER — ONDANSETRON HCL 4 MG/2ML IJ SOLN: 4.0000 mg | Freq: Once | INTRAMUSCULAR | Status: DC | PRN

## 2023-01-16 MED ORDER — CHLORHEXIDINE GLUCONATE 0.12 % MT SOLN
OROMUCOSAL | Status: AC
  Filled 2023-01-16: qty 15

## 2023-01-16 MED ORDER — DEXAMETHASONE SODIUM PHOSPHATE 10 MG/ML IJ SOLN
INTRAMUSCULAR | Status: AC
  Filled 2023-01-16: qty 1

## 2023-01-16 MED ORDER — GLYCOPYRROLATE 0.2 MG/ML IJ SOLN
INTRAMUSCULAR | Status: AC
  Filled 2023-01-16: qty 1

## 2023-01-16 MED ORDER — SUCCINYLCHOLINE CHLORIDE 200 MG/10ML IV SOSY
PREFILLED_SYRINGE | INTRAVENOUS | Status: AC
  Filled 2023-01-16: qty 10

## 2023-01-16 MED ORDER — MIDAZOLAM HCL 2 MG/2ML IJ SOLN
INTRAMUSCULAR | Status: AC
  Filled 2023-01-16: qty 2

## 2023-01-16 MED ORDER — PROPOFOL 10 MG/ML IV BOLUS
INTRAVENOUS | Status: DC | PRN
  Administered 2023-01-16: 20 mg via INTRAVENOUS
  Administered 2023-01-16: 120 mg via INTRAVENOUS

## 2023-01-16 MED ORDER — FENTANYL CITRATE (PF) 100 MCG/2ML IJ SOLN: 25.0000 ug | INTRAMUSCULAR | Status: DC | PRN

## 2023-01-16 MED ORDER — CHLORHEXIDINE GLUCONATE 0.12 % MT SOLN
15.0000 mL | Freq: Once | OROMUCOSAL | Status: AC
  Administered 2023-01-16: 15 mL via OROMUCOSAL

## 2023-01-16 MED ORDER — ONDANSETRON HCL 4 MG/2ML IJ SOLN
INTRAMUSCULAR | Status: DC | PRN
  Administered 2023-01-16: 4 mg via INTRAVENOUS

## 2023-01-16 MED ORDER — ONDANSETRON HCL 4 MG/2ML IJ SOLN
INTRAMUSCULAR | Status: AC
  Filled 2023-01-16: qty 2

## 2023-01-16 MED ORDER — SODIUM CHLORIDE 0.9 % IV SOLN: INTRAVENOUS | Status: DC

## 2023-01-16 MED ORDER — SEVOFLURANE IN SOLN
RESPIRATORY_TRACT | Status: AC
  Filled 2023-01-16: qty 250

## 2023-01-16 MED ORDER — LIDOCAINE HCL (PF) 2 % IJ SOLN
INTRAMUSCULAR | Status: AC
  Filled 2023-01-16: qty 5

## 2023-01-16 MED ORDER — DEXAMETHASONE SODIUM PHOSPHATE 10 MG/ML IJ SOLN
INTRAMUSCULAR | Status: DC | PRN
  Administered 2023-01-16: 5 mg via INTRAVENOUS

## 2023-01-16 NOTE — Discharge Instructions (Addendum)

## 2023-01-16 NOTE — Anesthesia Procedure Notes (Signed)
Procedure Name: Intubation Date/Time: 01/16/2023 11:45 AM  Performed by: Stormy Fabian, CRNAPre-anesthesia Checklist: Patient identified, Patient being monitored, Timeout performed, Emergency Drugs available and Suction available Patient Re-evaluated:Patient Re-evaluated prior to induction Oxygen Delivery Method: Circle system utilized Preoxygenation: Pre-oxygenation with 100% oxygen Induction Type: IV induction Ventilation: Mask ventilation without difficulty Laryngoscope Size: 3, McGraph and 4 Grade View: Grade I Tube type: Oral Tube size: 8.5 mm Number of attempts: 1 Airway Equipment and Method: Stylet Placement Confirmation: ETT inserted through vocal cords under direct vision, positive ETCO2 and breath sounds checked- equal and bilateral Secured at: 23 cm Tube secured with: Tape Dental Injury: Teeth and Oropharynx as per pre-operative assessment

## 2023-01-16 NOTE — Procedures (Addendum)
FLEXIBLE BRONCHOSCOPY WITH BRONCHOALVEOLAR LAVAGE, LUNG BIOPSY AND THERAPEUTIC ASPIRATION OF TRACHEOBRONCHIAL TREE PROCEDURE NOTE    Flexible bronchoscopy was performed on 01/16/23 by : Tyiana Hill MD  assistance by : Sheilah Mins MD  and 2)Shanon Mabe RT   Indication for the procedure was :  Pre-procedural H&P. The following assessment was performed on the day of the procedure prior to initiating sedation History:  Chest pain - NONE  Dyspnea YES  Hemoptysis NONE  Cough YES CHRONIC Fever NONE  Other pertinent items NONE   Examination Vital signs -reviewed as per nursing documentation today Cardiac    Murmurs: NONE  Rubs : NONE   Gallop: NONE Lungs Wheezing: YES ON RIGHT  Rales : NONE  Rhonchi :YES ON RIGHT   Other pertinent findings: NONE    Pre-procedural assessment for Procedural Sedation included: Depth of sedation: As per anesthesia team  ASA Classification:  2 Mallampati airway assessment: 2    Medication list reviewed: YES   The patient's interval history was taken and revealed: no new complaints The pre- procedure physical examination revealed: No new findings Refer to prior clinic note for details.  Informed Consent: Informed consent was obtained from:  patient after explanation of procedure and risks, benefits, as well as alternative procedures available.  Explanation of level of sedation and possible transfusion was also provided.    Procedural Preparation: Time out was performed and patient was identified by name and birthdate and procedure to be performed and side for sampling, if any, was specified. Pt was intubated by anesthesia.  The patient was appropriately draped.  Procedure Findings: Bronchoscope was inserted via ETT  without difficulty.  Posterior oropharynx, epiglottis, arytenoids, false cords and vocal cords were not visualized as these were bypassed by endotracheal tube.   The distal trachea was showing excessive dynamic collapse (EDAC)  with inspiration but normal in circumference and appearance without mucosal, cartilaginous or branching abnormalities when at rest.  The main carina was non splayed  .   All right and left lobar airways were visualized to the Sub-segmental level.  Sub- sub segmental carinae were identified in all the distal airways.   Secretions were visible in the following airways and appeared to be edematous and thickened at RLL .  The mucosa was : edematous at RLL  Airways were notable for:        exophytic lesions :RLL        extrinsic compression in the following distributions: RLL.       Friable mucosa: RLL       Anthrocotic material /pigmentation: none    Pictorial documentation attached: yes     Specimens obtained included: Cytology brushes : at right lower lobe   Broncho-alveolar lavage site:at right lower lobe   sent for cytology                              30ml volume infused 10ml volume returned with cellular  appearance  Endobronchial biopsy site:  right lower lobe ; sent for cytology                                  Fluoroscopy Used: none ;        Pictorial documentation attached: yes                  Media Information  Document Information  Photos  01/16/2023 11:51  Attached To:  Hospital Encounter on 01/16/23  Source Information  [name], MD  [facility]  Document History      Media Information  Document Information  Photos    01/16/2023 11:51  Attached To:  Hospital Encounter on 01/16/23  Source Information  [name], MD  [facility]  Document History     Media Information  Document Information  Photos    01/16/2023 11:51  Attached To:  Hospital Encounter on 01/16/23  Source Information  [name], MD  [facility]  Document History    Transbronchial WANG needle aspiration site: rll  sent for: cytolology                                     Immediate sampling complications included:none Epinephrine none ml was used  topically  The bronchoscopy was terminated due to completion of the planned procedure and the bronchoscope was removed.   Total dosage of Lidocaine was none mg Total fluoroscopy time was none  minutes  Supplemental oxygen was provided at as per anesthesia  lpm by nasal canula post operatively  Estimated Blood loss: expected <5ecc.  Complications included:  None   Preliminary CXR findings :  In process   Disposition: home with wife   Follow up with Dr. [name]  in 5 days for result discussion.   Georga Hacking MD  [facility] Division of Pulmonary & Critical Care Medicine

## 2023-01-16 NOTE — H&P (Signed)
PULMONOLOGY         Date: 01/16/2023,   MRN# [id number] Todd Farley October 12, 1945     AdmissionWeight: 94.8 kg                 CurrentWeight: 94.8 kg  Referring provider: Dr Meredeth Ide    CHIEF COMPLAINT:   Bronchial stenosis   HISTORY OF PRESENT ILLNESS   77 yo M with hx of GERD, umbilical hernia, neuropathy, skin cancer OA, anemia HTN, nephrolithiasis came in to [facility] pulmonary clinic with abnormal chest imaging. He was found to have Enhancing focal wall thickening and luminal narrowing of the right lower lobe bronchus located just distal to the takeoff of the superior segmental bronchus.  Recommendation by radiology for airway inspection due to suspicion for possible endobronchial neoplasm.  Today patient is here for bronchoscopic airway examination.   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   Diabetes mellitus without complication (HCC)    GERD (gastroesophageal reflux disease)    High cholesterol    History of kidney stones    Hypertension    Neuropathy    Umbilical hernia      SURGICAL HISTORY   Past Surgical History:  Procedure Laterality Date   CARPAL TUNNEL RELEASE Right    COLONOSCOPY WITH PROPOFOL N/A 06/25/2015   Procedure: COLONOSCOPY WITH PROPOFOL;  Surgeon: Scot Jun, MD;  Location: [facility] ENDOSCOPY;  Service: Endoscopy;  Laterality: N/A;   ESOPHAGOGASTRODUODENOSCOPY (EGD) WITH PROPOFOL N/A 06/25/2015   Procedure: ESOPHAGOGASTRODUODENOSCOPY (EGD) WITH PROPOFOL;  Surgeon: Scot Jun, MD;  Location: [facility] ENDOSCOPY;  Service: Endoscopy;  Laterality: N/A;   EYE SURGERY     eyelids x2   EYE SURGERY     cataract x 2 - intraocular implants   HERNIA REPAIR     JOINT REPLACEMENT Right    knee   KNEE ARTHROSCOPY Right    acl repair     FAMILY HISTORY   History reviewed. No pertinent family  history.   SOCIAL HISTORY   Social History   Tobacco Use   Smoking status: Former    Types: Cigarettes   Smokeless tobacco: Never  Vaping Use   Vaping status: Never Used  Substance Use Topics   Alcohol use: Yes    Comment: rare   Drug use: No     MEDICATIONS    Home Medication:    Current Medication:  Current Facility-Administered Medications:    0.9 %  sodium chloride infusion, , Intravenous, Continuous, Oluwatosin Bracy, MD, Last Rate: 10 mL/hr at 01/16/23 1108, New Bag at 01/16/23 1108    ALLERGIES   Other and Symbicort [budesonide-formoterol fumarate]     REVIEW OF SYSTEMS    Review of Systems:  Gen:  Denies  fever, sweats, chills weigh loss  HEENT: Denies blurred vision, double vision, ear pain, eye pain, hearing loss, nose bleeds, sore throat Cardiac:  No dizziness, chest pain or heaviness, chest tightness,edema Resp:   reports dyspnea chronically  Gi: Denies swallowing difficulty, stomach pain, nausea or vomiting, diarrhea, constipation, bowel incontinence Gu:  Denies bladder incontinence, burning urine Ext:   Denies Joint pain, stiffness or swelling Skin: Denies  skin rash, easy bruising or bleeding or hives Endoc:  Denies polyuria, polydipsia , polyphagia or weight change Psych:   Denies  depression, insomnia or hallucinations   Other:  All other systems negative   VS: BP 130/74   Pulse 71   Temp 98.6 F (37 C) (Temporal)   Resp 16   Ht 6' 1.5" (1.867 m)   Wt 94.8 kg   SpO2 100%   BMI 27.20 kg/m      PHYSICAL EXAM    GENERAL:NAD, no fevers, chills, no weakness no fatigue HEAD: Normocephalic, atraumatic.  EYES: Pupils equal, round, reactive to light. Extraocular muscles intact. No scleral icterus.  MOUTH: Moist mucosal membrane. Dentition intact. No abscess noted.  EAR, NOSE, THROAT: Clear without exudates. No external lesions.  NECK: Supple. No thyromegaly. No nodules. No JVD.  PULMONARY: decreased breath sounds with mild rhonchi  worse at bases bilaterally.  CARDIOVASCULAR: S1 and S2. Regular rate and rhythm. No murmurs, rubs, or gallops. No edema. Pedal pulses 2+ bilaterally.  GASTROINTESTINAL: Soft, nontender, nondistended. No masses. Positive bowel sounds. No hepatosplenomegaly.  MUSCULOSKELETAL: No swelling, clubbing, or edema. Range of motion full in all extremities.  NEUROLOGIC: Cranial nerves II through XII are intact. No gross focal neurological deficits. Sensation intact. Reflexes intact.  SKIN: No ulceration, lesions, rashes, or cyanosis. Skin warm and dry. Turgor intact.  PSYCHIATRIC: Mood, affect within normal limits. The patient is awake, alert and oriented x 3. Insight, judgment intact.       IMAGING     pression  CLINICAL DATA:  Wheezing   EXAM: CT CHEST WITH CONTRAST   TECHNIQUE: Multidetector CT imaging of the chest was performed during intravenous contrast administration.   RADIATION DOSE REDUCTION: This exam was performed according to the departmental dose-optimization program which includes automated exposure control, adjustment of the mA and/or kV according to patient size and/or use of iterative reconstruction technique.   CONTRAST:  75mL OMNIPAQUE IOHEXOL 300 MG/ML  SOLN   COMPARISON:  None Available.   FINDINGS: Cardiovascular: Normal heart size. No pericardial effusion. Normal caliber thoracic aorta with no significant atherosclerotic disease. Mild coronary artery calcifications.   Mediastinum/Nodes: Esophagus and thyroid are unremarkable. Enhancing focal wall thickening and luminal narrowing of the right lower lobe bronchus located just distal to the takeoff of the superior segmental bronchus, measures up to 5 mm in thickness axial series 2, image 106. No enlarged lymph nodes seen in the chest.   Lungs/Pleura: Central airways are patent. No consolidation, pleural effusion or pneumothorax. Mild linear opacities of the right lower lobe which are likely due to scarring or  atelectasis.   Upper Abdomen: Diverticulosis.  No acute abnormality.   Musculoskeletal: No chest wall abnormality. No acute or significant osseous findings.   IMPRESSION: 1. 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 Recommend endoscopy for further evaluation. 2. Mild coronary artery calcifications.   These results will be called to the ordering clinician or representative by the Radiologist Assistant, and communication documented in the PACS or [employer].     Electronically Signed   By: Allegra Lai M.D.   On: 12/17/2022 08:26     ASSESSMENT/PLAN   Right lower lobe endobronchial lesion   - possible neoplasm   - mucus plugging    -  Enhancing focal wall thickening or airways of RLL - plan for airway inpsection via flexible bronchoscopy, with biopsy , FNA, brushing, BAL and therapeutic aspiration of tracheobronchial tree.  -Reviewed risks/complications and benefits with patient, risks include infection, pneumothorax/pneumomediastinum which may  require chest tube placement as well as overnight/prolonged hospitalization and possible mechanical ventilation. Other risks include bleeding and very rarely death.  Patient understands risks and wishes to proceed.  Additional questions were answered, and patient is aware that post procedure patient will be going home with family and may experience cough with possible clots on expectoration as well as phlegm which may last few days as well as hoarseness of voice post intubation and mechanical ventilation.        Thank you for allowing me to participate in the care of this patient.   Patient/Family are satisfied with care plan and all questions have been answered.    Provider disclosure: Patient with at least one acute or chronic illness or injury that poses a threat to life or  bodily function and is being managed actively during this encounter.  All of the below services have been performed independently by signing provider:  review of prior documentation from internal and or external health records.  Review of previous and current lab results.  Interview and comprehensive assessment during patient visit today. Review of current and previous chest radiographs/CT scans. Discussion of management and test interpretation with health care team and patient/family.   This document was prepared using Dragon voice recognition software and may include unintentional dictation errors.     [name], M.D.  Division of Pulmonary & Critical Care Medicine

## 2023-01-16 NOTE — Anesthesia Preprocedure Evaluation (Signed)
Anesthesia Evaluation  Patient identified by MRN, date of birth, ID band Patient awake    Reviewed: Allergy & Precautions, NPO status , Patient's Chart, lab work & pertinent test results  Airway Mallampati: II  TM Distance: >3 FB Neck ROM: full    Dental  (+) Teeth Intact   Pulmonary neg pulmonary ROS, Patient abstained from smoking., former smoker   Pulmonary exam normal breath sounds clear to auscultation       Cardiovascular Exercise Tolerance: Good hypertension, Pt. on medications negative cardio ROS Normal cardiovascular exam Rhythm:Regular Rate:Normal     Neuro/Psych negative neurological ROS  negative psych ROS   GI/Hepatic negative GI ROS, Neg liver ROS,GERD  Medicated,,  Endo/Other  negative endocrine ROSdiabetes, Type 2, Oral Hypoglycemic Agents    Renal/GU      Musculoskeletal  (+) Arthritis ,    Abdominal Normal abdominal exam  (+)   Peds negative pediatric ROS (+)  Hematology negative hematology ROS (+) Blood dyscrasia, anemia   Anesthesia Other Findings Past Medical History: No date: Actinic keratosis No date: Anemia No date: Arthritis 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, right No date: Cancer (HCC)     Comment:  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 No date: Diabetes mellitus without complication (HCC) No date: GERD (gastroesophageal reflux disease) No date: High cholesterol No date: History of kidney stones No date: Hypertension No date: Neuropathy No date: Umbilical hernia  Past Surgical History: No date: CARPAL TUNNEL RELEASE; Right 06/25/2015: COLONOSCOPY WITH PROPOFOL; N/A     Comment:  Procedure: COLONOSCOPY WITH PROPOFOL;  Surgeon: Scot Jun, MD;  Location: [facility] ENDOSCOPY;  Service:               Endoscopy;  Laterality: N/A; 06/25/2015: ESOPHAGOGASTRODUODENOSCOPY (EGD) WITH PROPOFOL; N/A     Comment:  Procedure: ESOPHAGOGASTRODUODENOSCOPY (EGD) WITH               PROPOFOL;  Surgeon:  Scot Jun, MD;  Location: [facility]              ENDOSCOPY;  Service: Endoscopy;  Laterality: N/A; No date: EYE SURGERY     Comment:  eyelids x2 No date: EYE SURGERY     Comment:  cataract x 2 - intraocular implants No date: HERNIA REPAIR No date: JOINT REPLACEMENT; Right     Comment:  knee No date: KNEE ARTHROSCOPY; Right     Comment:  acl repair  BMI    Body Mass Index: 27.20 kg/m      Reproductive/Obstetrics negative OB ROS                             Anesthesia Physical Anesthesia Plan  ASA: 2  Anesthesia Plan: General   Post-op Pain Management:    Induction: Intravenous  PONV Risk Score and Plan: Ondansetron, Dexamethasone, Midazolam and Treatment may vary due to age or medical condition  Airway Management Planned: Oral ETT  Additional Equipment:   Intra-op Plan:   Post-operative Plan: Extubation in OR  Informed Consent: I have reviewed the patients History and Physical, chart, labs and discussed the procedure including the risks, benefits and alternatives for the proposed anesthesia with the patient or authorized representative who has indicated his/her understanding and acceptance.     Dental Advisory Given  Plan Discussed with: CRNA and Surgeon  Anesthesia Plan Comments:        Anesthesia Quick Evaluation

## 2023-01-17 ENCOUNTER — Encounter: Payer: Self-pay | Admitting: Pulmonary Disease

## 2023-01-17 NOTE — Transfer of Care (Signed)
Immediate Anesthesia Transfer of Care Note  Patient: Todd Farley  Procedure(s) Performed: BRONCHIAL WASHINGS FLEXIBLE BRONCHOSCOPY  Patient Location: PACU  Anesthesia Type:General  Level of Consciousness: drowsy  Airway & Oxygen Therapy: Patient Spontanous Breathing and Patient connected to face mask oxygen  Post-op Assessment: Report given to RN and Post -op Vital signs reviewed and stable  Post vital signs: Reviewed and stable  Last Vitals:  Vitals Value Taken Time  BP    Temp    Pulse    Resp    SpO2      Last Pain:  Vitals:   01/16/23 1324  TempSrc: Temporal  PainSc: 0-No pain         Complications: No notable events documented.

## 2023-01-18 NOTE — Plan of Care (Signed)
[facility] Tonsillectomy/Adenoidectomy, Postoperative PEDS care plan entered in error.

## 2023-01-19 LAB — CULTURE, BAL-QUANTITATIVE W GRAM STAIN

## 2023-01-20 LAB — SURGICAL PATHOLOGY

## 2023-01-21 NOTE — Anesthesia Postprocedure Evaluation (Signed)
Anesthesia Post Note  Patient: Todd Farley  Procedure(s) Performed: BRONCHIAL WASHINGS FLEXIBLE BRONCHOSCOPY  Patient location during evaluation: PACU Anesthesia Type: General Level of consciousness: awake Pain management: pain level controlled Vital Signs Assessment: post-procedure vital signs reviewed and stable Respiratory status: spontaneous breathing Cardiovascular status: blood pressure returned to baseline Anesthetic complications: no   No notable events documented.   Last Vitals:  Vitals:   01/16/23 1300 01/16/23 1324  BP: (!) 149/95 126/79  Pulse: 76 77  Resp: 16 18  Temp: 36.6 C 36.7 C  SpO2: 99% 97%    Last Pain:  Vitals:   01/16/23 1324  TempSrc: Temporal  PainSc: 0-No pain                 VAN STAVEREN,Niylah Hassan

## 2023-01-23 LAB — CYTOLOGY - NON PAP

## 2023-01-29 ENCOUNTER — Ambulatory Visit: Payer: Medicare Other | Admitting: Dermatology

## 2023-03-31 ENCOUNTER — Other Ambulatory Visit: Payer: Self-pay | Admitting: Emergency Medicine

## 2023-03-31 DIAGNOSIS — R918 Other nonspecific abnormal finding of lung field: Secondary | ICD-10-CM

## 2023-04-03 ENCOUNTER — Encounter: Payer: Medicare Other | Admitting: Thoracic Surgery (Cardiothoracic Vascular Surgery)

## 2023-04-07 ENCOUNTER — Encounter: Payer: Self-pay | Admitting: Emergency Medicine

## 2023-04-07 DIAGNOSIS — R918 Other nonspecific abnormal finding of lung field: Secondary | ICD-10-CM

## 2023-04-08 ENCOUNTER — Ambulatory Visit
Admission: RE | Admit: 2023-04-08 | Discharge: 2023-04-08 | Disposition: A | Discharge status: Home / Self Care | Payer: Medicare Other | Source: Ambulatory Visit | Attending: Emergency Medicine | Admitting: Emergency Medicine

## 2023-04-08 DIAGNOSIS — R918 Other nonspecific abnormal finding of lung field: Secondary | ICD-10-CM | POA: Diagnosis present

## 2023-04-08 LAB — GLUCOSE, CAPILLARY: Glucose-Capillary: 69 mg/dL — ABNORMAL LOW (ref 70–99)

## 2023-04-08 MED ORDER — FLUDEOXYGLUCOSE F - 18 (FDG) INJECTION
10.7000 | Freq: Once | INTRAVENOUS | Status: AC | PRN
  Administered 2023-04-08: 11.05 via INTRAVENOUS

## 2023-04-27 ENCOUNTER — Ambulatory Visit: Payer: Medicare Other | Attending: Neurology | Admitting: Physical Therapy

## 2023-04-27 ENCOUNTER — Other Ambulatory Visit: Payer: Self-pay

## 2023-04-27 DIAGNOSIS — R2681 Unsteadiness on feet: Secondary | ICD-10-CM | POA: Insufficient documentation

## 2023-04-27 DIAGNOSIS — R269 Unspecified abnormalities of gait and mobility: Secondary | ICD-10-CM | POA: Diagnosis present

## 2023-04-27 DIAGNOSIS — M6281 Muscle weakness (generalized): Secondary | ICD-10-CM | POA: Insufficient documentation

## 2023-04-27 NOTE — Therapy (Unsigned)
OUTPATIENT PHYSICAL THERAPY NEURO EVALUATION   Patient Name: Todd Farley MRN: [id number] DOB:1945-06-11, 78 y.o., male Today's Date: 04/27/2023   PCP: Gracelyn Nurse, MD  REFERRING PROVIDER: Lonell Face, MD   END OF SESSION:  PT End of Session - 04/27/23 1452     Visit Number 1    Number of Visits 24    Date for PT Re-Evaluation 07/20/23    PT Start Time 1455    PT Stop Time 1530    PT Time Calculation (min) 35 min    Equipment Utilized During Treatment Gait belt    Activity Tolerance Patient tolerated treatment well    Behavior During Therapy WFL for tasks assessed/performed             Past Medical History:  Diagnosis Date   Actinic keratosis    Anemia    Arthritis    Bronchial stenosis, right    Cancer (HCC)    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   Diabetes mellitus without complication (HCC)    GERD (gastroesophageal reflux disease)    High cholesterol    History of kidney stones    Hypertension    Neuropathy    Umbilical hernia    Past Surgical History:  Procedure Laterality Date   BRONCHIAL WASHINGS N/A 01/16/2023   Procedure: BRONCHIAL WASHINGS;  Surgeon: [name], MD;  Location: [facility] ORS;  Service: Thoracic;  Laterality: N/A;   CARPAL TUNNEL RELEASE Right    COLONOSCOPY WITH PROPOFOL N/A 06/25/2015   Procedure: COLONOSCOPY WITH PROPOFOL;  Surgeon: Scot Jun, MD;  Location: [facility] ENDOSCOPY;  Service: Endoscopy;  Laterality: N/A;   ESOPHAGOGASTRODUODENOSCOPY (EGD) WITH PROPOFOL N/A 06/25/2015   Procedure: ESOPHAGOGASTRODUODENOSCOPY (EGD) WITH PROPOFOL;  Surgeon: Scot Jun, MD;  Location: [facility] ENDOSCOPY;  Service: Endoscopy;  Laterality: N/A;   EYE SURGERY     eyelids x2   EYE SURGERY     cataract x 2 - intraocular implants   FLEXIBLE BRONCHOSCOPY N/A 01/16/2023   Procedure: FLEXIBLE BRONCHOSCOPY;  Surgeon: [name], MD;  Location: [facility] ORS;  Service: Thoracic;  Laterality: N/A;   HERNIA REPAIR     JOINT REPLACEMENT Right     knee   KNEE ARTHROSCOPY Right    acl repair   Patient Active Problem List   Diagnosis Date Noted   Umbilical hernia without obstruction and without gangrene 01/26/2018   Diabetic polyneuropathy associated with type 2 diabetes mellitus (HCC) 10/13/2017   GERD (gastroesophageal reflux disease) 04/13/2015   Diabetes mellitus type 2, uncomplicated (HCC) 03/14/2014   Hypertension 03/14/2014    ONSET DATE: >1 year  REFERRING DIAG:  Diagnosis  R26.89 (ICD-10-CM) - Imbalance    THERAPY DIAG:  Abnormality of gait and mobility  Unsteadiness on feet  Muscle weakness (generalized)  Rationale for Evaluation and Treatment: Rehabilitation  SUBJECTIVE:  SUBJECTIVE STATEMENT: Reports that he has profound neuropathy in bilateral LE. Has had 2 falls in the last year. One when taking garbage can and and then while at the [employer]. Mild-moderate/ head trauma when each fall. Last fall was in the summer 2024. Recently diagnosed "moderately severe arthropathy" in the left hip.  Pain in standing and mobility in the L hip.   Pt accompanied by: self  PERTINENT HISTORY:  Recent MD appointment for hip pain.   "Left hip pain. He has known osteoarthritis. Is been getting worse. He did have PET scan done just recently which had an incidental finding of moderately severe arthropathy in the left hip. Have made referral to orthopedics for further workup and treatment. " From Neurology:  "Patient states his neuropathy symptoms are worse. Increased numbness in his feet. At times is he is not paying attention he does not known where his feet are. His balance is worse. He has had two falls since his last visit. Using a cane to ambulate now. He was seen at [facility] Emergency Room on 09/09/2022 after a  fall. This fall was mechanical. He was wheeling his garbage cans to the curb early in the morning on minimal sleep. Prior to this, had a mechanical fall over a terrain change. Has difficulty with prolonged standing secondary to worsening imbalance and pain (left > right). Denies fasciculations, cramping, myalgia, foot drop. "  PAIN:  Are you having pain? Yes: NPRS scale: 5/10  Pain location: bil feel and L hip  Pain description: ache in bil feet. Sharp in the hip  Aggravating factors: weight bearing  Relieving factors: rest.   PRECAUTIONS: Fall  RED FLAGS: None   WEIGHT BEARING RESTRICTIONS: No  FALLS: Has patient fallen in last 6 months? No and multiple near falls   LIVING ENVIRONMENT: Lives with: lives with their spouse Lives in: House/apartment Stairs: Yes: Internal: 15 steps; on left going up and External: 3 steps; on right going up, on left going up, and can reach both Has following equipment at home: Single point cane  PLOF: Independent and Independent with basic ADLs has been using [facility] for roughly the last year.   PATIENT GOALS: improve balance, strength, endurance   OBJECTIVE:  Note: Objective measures were completed at Evaluation unless otherwise noted.  DIAGNOSTIC FINDINGS:  EXAM: NUCLEAR MEDICINE PET SKULL BASE TO THIGH IMPRESSION: 1. No findings suspicious for thoracic malignancy. No definite residual wall thickening or hypermetabolic activity within the proximal right lower lobe bronchus. Correlate with prior bronchoscopy results and consider follow-up chest CT in 3-6 months, as clinically warranted. 2. No suspicious metabolic activity identified within the neck, abdomen or pelvis. 3. Healing fracture of the right 4th rib anteriorly with low level metabolic activity. 4. Hepatic steatosis and colonic diverticulosis.  COGNITION: Overall cognitive status: Within functional limits for tasks assessed   SENSATION: Hx of diabetic neuropathy in bil ft and  ankles. Medial worse than lateral    COORDINATION: Grossly WFL, but mild foot drop on the RLE.   EDEMA:  Mild bil distal edema.   MUSCLE TONE: WFL  MUSCLE LENGTH:   POSTURE: forward head and posterior pelvic tilt  LOWER EXTREMITY ROM:     Grossly WFL   LOWER EXTREMITY MMT:    MMT Right Eval Left Eval  Hip flexion 4 4-  Hip extension    Hip abduction 4+ 4+  Hip adduction 4+ 4  Hip internal rotation    Hip external rotation  Knee flexion 4+ 4-  Knee extension 5 4-  Ankle dorsiflexion 4 4-  Ankle plantarflexion    Ankle inversion    Ankle eversion    (Blank rows = not tested)  TRANSFERS: Assistive device utilized: None  Sit to stand: Modified independence Stand to sit: Complete Independence and Modified independence Chair to chair: Modified independence Floor:  to be assessed. Required assist with recovery from fall > 6 months ago   RAMP:  Level of Assistance: Modified independence Assistive device utilized: Single point cane Ramp Comments:  CURB:  Level of Assistance: Modified independence Assistive device utilized:  rail  Curb Comments:   STAIRS: Level of Assistance: Modified independence Stair Negotiation Technique: Step to Pattern Alternating Pattern  with Bilateral Rails Number of Stairs: 4  Height of Stairs: 6  Comments: step to ascent, step through descent   GAIT: Gait pattern:  excessive hip ER and ankle eversion on the RLE, decreased ankle dorsiflexion- Left, Right foot flat, Left foot flat, lateral hip instability, wide BOS, and poor foot clearance- Left Distance walked: 60 Assistive device utilized: Single point cane and None Level of assistance: Modified independence Comments: noted to have lateral hip instability with increased time in standing   FUNCTIONAL TESTS:  5 times sit to stand: 15.5 sec  Timed up and go (TUG): 21.54 6 minute walk test: TBD 10 meter walk test: 15.2sec  Berg Balance Scale: TBD   PATIENT SURVEYS:  ABC  scale 77.5%                                                                                                                              TREATMENT DATE: 04/27/2023   Eval Only.   PATIENT EDUCATION: Education details: POC.  Person educated: Patient Education method: Explanation Education comprehension: verbalized understanding  HOME EXERCISE PROGRAM: To be given on visit 2.   GOALS: Goals reviewed with patient? Yes   SHORT TERM GOALS: Target date: 05/26/2023    Patient will be independent in home exercise program to improve strength/mobility for better functional independence with ADLs. Baseline: to be given  Goal status: INITIAL   LONG TERM GOALS: Target date: 07/21/2023    Patient will increase ABC score by equal to or greater than  8%   to demonstrate statistically significant improvement in mobility and quality of life.  Baseline: 77.5 Goal status: INITIAL  2.  Patient (> 36 years old) will complete five times sit to stand test in < 15 seconds indicating an increased LE strength and improved balance. Baseline:15.5sec Goal status: INITIAL  3.  Patient will increase Berg Balance score by > 6 points to demonstrate decreased fall risk during functional activities Baseline: TBD Goal status: INITIAL  4.  Patient will increase 10 meter walk test to >1.57m/s as to improve gait speed for better community ambulation and to reduce fall risk. Baseline: 0.43m/s(15.2 sec ) Goal status: INITIAL  5.  Patient will reduce timed up and go to <  11 seconds to reduce fall risk and demonstrate improved transfer/gait ability. Baseline: 21.54sec Goal status: INITIAL    ASSESSMENT:  CLINICAL IMPRESSION: Patient is a 78 y.o. male who was seen today for physical therapy evaluation and treatment for imbalance, hip pain and BLE weakness. Pt noted to have decreased gait speed, increased time on TUG and increased time on 5xSTS. Noted to have significant BLE hip ER as well as LLE eversion as  well as mild foot drop on the LLE. Of note, pt will be seen by orthopedic MD in the near future to address hip pain.  Also noted to have mild hip/ankle weakness on assessment. Pt will benefit from skilled PT to address balance, strength and pain deficits, to improve overall QoL and return to PLOF.    OBJECTIVE IMPAIRMENTS: Abnormal gait, decreased activity tolerance, decreased balance, decreased endurance, decreased knowledge of condition, decreased knowledge of use of DME, decreased mobility, difficulty walking, decreased ROM, decreased strength, impaired perceived functional ability, impaired flexibility, impaired sensation, impaired UE functional use, and improper body mechanics.   ACTIVITY LIMITATIONS: stairs, transfers, and locomotion level  PARTICIPATION LIMITATIONS: cleaning, laundry, community activity, and yard work  PERSONAL FACTORS: 1-2 comorbidities: moderately severe arthropathy   are also affecting patient's functional outcome.   REHAB POTENTIAL: Good  CLINICAL DECISION MAKING: Stable/uncomplicated  EVALUATION COMPLEXITY: Moderate  PLAN:  PT FREQUENCY: 1-2x/week  PT DURATION: 12 weeks  PLANNED INTERVENTIONS: 97110-Therapeutic exercises, 97530- Therapeutic activity, [id number]- Neuromuscular re-education, 97535- Self Care, 16109- Manual therapy, [id number]- Gait training, [phone]- Orthotic Fit/training, [phone]- Aquatic Therapy, [phone]- Splinting, Balance training, Stair training, Dry Needling, Joint mobilization, Joint manipulation, Compression bandaging, DME instructions, Cryotherapy, and Moist heat  PLAN FOR NEXT SESSION: complete 6 min walk test and Berg.  Balance HEP    Golden Pop, PT 04/27/2023, 2:57 PM

## 2023-04-29 ENCOUNTER — Ambulatory Visit: Payer: Medicare Other | Admitting: Physical Therapy

## 2023-04-29 DIAGNOSIS — R269 Unspecified abnormalities of gait and mobility: Secondary | ICD-10-CM | POA: Diagnosis not present

## 2023-04-29 DIAGNOSIS — M6281 Muscle weakness (generalized): Secondary | ICD-10-CM

## 2023-04-29 DIAGNOSIS — R2681 Unsteadiness on feet: Secondary | ICD-10-CM

## 2023-04-29 NOTE — Therapy (Signed)
OUTPATIENT PHYSICAL THERAPY NEURO TREATMENT   Patient Name: Todd Farley MRN: [id number] DOB:1945/07/04, 78 y.o., male Today's Date: 04/29/2023   PCP: Gracelyn Nurse, MD  REFERRING PROVIDER: Lonell Face, MD   END OF SESSION:  PT End of Session - 04/29/23 1535     Visit Number 2    Number of Visits 24    Date for PT Re-Evaluation 07/20/23    Progress Note Due on Visit 10    PT Start Time 1533    PT Stop Time 1613    PT Time Calculation (min) 40 min    Equipment Utilized During Treatment Gait belt    Activity Tolerance Patient tolerated treatment well    Behavior During Therapy WFL for tasks assessed/performed              Past Medical History:  Diagnosis Date   Actinic keratosis    Anemia    Arthritis    Bronchial stenosis, right    Cancer (HCC)    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   Diabetes mellitus without complication (HCC)    GERD (gastroesophageal reflux disease)    High cholesterol    History of kidney stones    Hypertension    Neuropathy    Umbilical hernia    Past Surgical History:  Procedure Laterality Date   BRONCHIAL WASHINGS N/A 01/16/2023   Procedure: BRONCHIAL WASHINGS;  Surgeon: [name], MD;  Location: [facility] ORS;  Service: Thoracic;  Laterality: N/A;   CARPAL TUNNEL RELEASE Right    COLONOSCOPY WITH PROPOFOL N/A 06/25/2015   Procedure: COLONOSCOPY WITH PROPOFOL;  Surgeon: Scot Jun, MD;  Location: [facility] ENDOSCOPY;  Service: Endoscopy;  Laterality: N/A;   ESOPHAGOGASTRODUODENOSCOPY (EGD) WITH PROPOFOL N/A 06/25/2015   Procedure: ESOPHAGOGASTRODUODENOSCOPY (EGD) WITH PROPOFOL;  Surgeon: Scot Jun, MD;  Location: [facility] ENDOSCOPY;  Service: Endoscopy;  Laterality: N/A;   EYE SURGERY     eyelids x2   EYE SURGERY     cataract x 2 - intraocular implants   FLEXIBLE BRONCHOSCOPY N/A 01/16/2023   Procedure: FLEXIBLE BRONCHOSCOPY;  Surgeon: [name], MD;  Location: [facility] ORS;  Service: Thoracic;  Laterality: N/A;   HERNIA REPAIR      JOINT REPLACEMENT Right    knee   KNEE ARTHROSCOPY Right    acl repair   Patient Active Problem List   Diagnosis Date Noted   Umbilical hernia without obstruction and without gangrene 01/26/2018   Diabetic polyneuropathy associated with type 2 diabetes mellitus (HCC) 10/13/2017   GERD (gastroesophageal reflux disease) 04/13/2015   Diabetes mellitus type 2, uncomplicated (HCC) 03/14/2014   Hypertension 03/14/2014    ONSET DATE: >1 year  REFERRING DIAG:  Diagnosis  R26.89 (ICD-10-CM) - Imbalance    THERAPY DIAG:  Abnormality of gait and mobility  Unsteadiness on feet  Muscle weakness (generalized)  Rationale for Evaluation and Treatment: Rehabilitation  SUBJECTIVE:  SUBJECTIVE STATEMENT: Reports that he has profound neuropathy in bilateral LE. Has had 2 falls in the last year. One when taking garbage can and and then while at the [employer]. Mild-moderate/ head trauma when each fall. Last fall was in the summer 2024. Recently diagnosed "moderately severe arthropathy" in the left hip.  Pain in standing and mobility in the L hip.   Pt accompanied by: self  PERTINENT HISTORY:  Recent MD appointment for hip pain.   "Left hip pain. He has known osteoarthritis. Is been getting worse. He did have PET scan done just recently which had an incidental finding of moderately severe arthropathy in the left hip. Have made referral to orthopedics for further workup and treatment. " From Neurology:  "Patient states his neuropathy symptoms are worse. Increased numbness in his feet. At times is he is not paying attention he does not known where his feet are. His balance is worse. He has had two falls since his last visit. Using a cane to ambulate now. He was seen at [facility] Room on 09/09/2022 after a fall. This fall was mechanical. He was wheeling his garbage cans to the curb early in the morning on minimal sleep. Prior to this, had a mechanical fall over a terrain change. Has difficulty with prolonged standing secondary to worsening imbalance and pain (left > right). Denies fasciculations, cramping, myalgia, foot drop. "  PAIN:  Are you having pain? Yes: NPRS scale: 5/10  Pain location: bil feel and L hip  Pain description: ache in bil feet. Sharp in the hip  Aggravating factors: weight bearing  Relieving factors: rest.   PRECAUTIONS: Fall  RED FLAGS: None   WEIGHT BEARING RESTRICTIONS: No  FALLS: Has patient fallen in last 6 months? No and multiple near falls   LIVING ENVIRONMENT: Lives with: lives with their spouse Lives in: House/apartment Stairs: Yes: Internal: 15 steps; on left going up and External: 3 steps; on right going up, on left going up, and can reach both Has following equipment at home: Single point cane  PLOF: Independent and Independent with basic ADLs has been using [facility] for roughly the last year.   PATIENT GOALS: improve balance, strength, endurance   OBJECTIVE:  Note: Objective measures were completed at Evaluation unless otherwise noted.  DIAGNOSTIC FINDINGS:  EXAM: NUCLEAR MEDICINE PET SKULL BASE TO THIGH IMPRESSION: 1. No findings suspicious for thoracic malignancy. No definite residual wall thickening or hypermetabolic activity within the proximal right lower lobe bronchus. Correlate with prior bronchoscopy results and consider follow-up chest CT in 3-6 months, as clinically warranted. 2. No suspicious metabolic activity identified within the neck, abdomen or pelvis. 3. Healing fracture of the right 4th rib anteriorly with low level metabolic activity. 4. Hepatic steatosis and colonic diverticulosis.  COGNITION: Overall cognitive status: Within functional limits for tasks assessed   SENSATION: Hx of  diabetic neuropathy in bil ft and ankles. Medial worse than lateral    COORDINATION: Grossly WFL, but mild foot drop on the RLE.   EDEMA:  Mild bil distal edema.   MUSCLE TONE: WFL  MUSCLE LENGTH:   POSTURE: forward head and posterior pelvic tilt  LOWER EXTREMITY ROM:     Grossly WFL   LOWER EXTREMITY MMT:    MMT Right Eval Left Eval  Hip flexion 4 4-  Hip extension    Hip abduction 4+ 4+  Hip adduction 4+ 4  Hip internal rotation    Hip external rotation  Knee flexion 4+ 4-  Knee extension 5 4-  Ankle dorsiflexion 4 4-  Ankle plantarflexion    Ankle inversion    Ankle eversion    (Blank rows = not tested)  TRANSFERS: Assistive device utilized: None  Sit to stand: Modified independence Stand to sit: Complete Independence and Modified independence Chair to chair: Modified independence Floor:  to be assessed. Required assist with recovery from fall > 6 months ago   RAMP:  Level of Assistance: Modified independence Assistive device utilized: Single point cane Ramp Comments:  CURB:  Level of Assistance: Modified independence Assistive device utilized:  rail  Curb Comments:   STAIRS: Level of Assistance: Modified independence Stair Negotiation Technique: Step to Pattern Alternating Pattern  with Bilateral Rails Number of Stairs: 4  Height of Stairs: 6  Comments: step to ascent, step through descent   GAIT: Gait pattern:  excessive hip ER and ankle eversion on the RLE, decreased ankle dorsiflexion- Left, Right foot flat, Left foot flat, lateral hip instability, wide BOS, and poor foot clearance- Left Distance walked: 60 Assistive device utilized: Single point cane and None Level of assistance: Modified independence Comments: noted to have lateral hip instability with increased time in standing   FUNCTIONAL TESTS:  5 times sit to stand: 15.5 sec  Timed up and go (TUG): 21.54 6 minute walk test: TBD 10 meter walk test: 15.2sec  Berg Balance Scale:  TBD   PATIENT SURVEYS:  ABC scale 77.5%                                                                                                                              TREATMENT DATE: 04/29/23  [facility] PT Assessment - 04/29/23 0001       Standardized Balance Assessment   Standardized Balance Assessment Berg Balance Test      Berg Balance Test   Sit to Stand Able to stand  independently using hands    Standing Unsupported Able to stand safely 2 minutes    Sitting with Back Unsupported but Feet Supported on Floor or Stool Able to sit safely and securely 2 minutes    Stand to Sit Controls descent by using hands    Transfers Able to transfer safely, minor use of hands    Standing Unsupported with Eyes Closed Able to stand 10 seconds with supervision    Standing Unsupported with Feet Together Able to place feet together independently and stand 1 minute safely    From Standing, Reach Forward with Outstretched Arm Can reach forward >12 cm safely (5")    From Standing Position, Pick up Object from Floor Able to pick up shoe, needs supervision    From Standing Position, Turn to Look Behind Over each Shoulder Turn sideways only but maintains balance    Turn 360 Degrees Able to turn 360 degrees safely but slowly    Standing Unsupported, Alternately Place Feet on Step/Stool Able to stand independently and complete 8 steps >20  seconds    Standing Unsupported, One Foot in Front Able to take small step independently and hold 30 seconds    Standing on One Leg Tries to lift leg/unable to hold 3 seconds but remains standing independently    Total Score 41             HEP initiation:   Seated heel toe raises 2 x 15 ea LE  Standing supported march 2 x 15 x 3 sec with GTB  Attempted standing hip abduction, okay on the R but pain on the R, transitioned to seated  Seated Hip ABD 2 x 15 ea LE  STS 2 x 10 No UE    PATIENT EDUCATION: Education details: POC.  Person educated: Patient Education method:  Explanation Education comprehension: verbalized understanding  HOME EXERCISE PROGRAM: To be given on visit 2.   GOALS: Goals reviewed with patient? Yes   SHORT TERM GOALS: Target date: 05/26/2023    Patient will be independent in home exercise program to improve strength/mobility for better functional independence with ADLs. Baseline: to be given  Goal status: INITIAL   LONG TERM GOALS: Target date: 07/21/2023    Patient will increase ABC score by equal to or greater than  8%   to demonstrate statistically significant improvement in mobility and quality of life.  Baseline: 77.5 Goal status: INITIAL  2.  Patient (> 65 years old) will complete five times sit to stand test in < 15 seconds indicating an increased LE strength and improved balance. Baseline:15.5sec Goal status: INITIAL  3.  Patient will increase Berg Balance score by > 6 points to demonstrate decreased fall risk during functional activities Baseline: TBD Goal status: INITIAL  4.  Patient will increase 10 meter walk test to >1.71m/s as to improve gait speed for better community ambulation and to reduce fall risk. Baseline: 0.23m/s(15.2 sec ) Goal status: INITIAL  5.  Patient will reduce timed up and go to <11 seconds to reduce fall risk and demonstrate improved transfer/gait ability. Baseline: 21.54sec Goal status: INITIAL    ASSESSMENT:  CLINICAL IMPRESSION: Pt presents with good motivation for completion of PT activities. Pt shows   OBJECTIVE IMPAIRMENTS: Abnormal gait, decreased activity tolerance, decreased balance, decreased endurance, decreased knowledge of condition, decreased knowledge of use of DME, decreased mobility, difficulty walking, decreased ROM, decreased strength, impaired perceived functional ability, impaired flexibility, impaired sensation, impaired UE functional use, and improper body mechanics.   ACTIVITY LIMITATIONS: stairs, transfers, and locomotion level  PARTICIPATION  LIMITATIONS: cleaning, laundry, community activity, and yard work  PERSONAL FACTORS: 1-2 comorbidities: moderately severe arthropathy   are also affecting patient's functional outcome.   REHAB POTENTIAL: Good  CLINICAL DECISION MAKING: Stable/uncomplicated  EVALUATION COMPLEXITY: Moderate  PLAN:  PT FREQUENCY: 1-2x/week  PT DURATION: 12 weeks  PLANNED INTERVENTIONS: 97110-Therapeutic exercises, 97530- Therapeutic activity, [id number]- Neuromuscular re-education, 97535- Self Care, 27253- Manual therapy, [id number]- Gait training, [phone]- Orthotic Fit/training, [phone]- Aquatic Therapy, [phone]- Splinting, Balance training, Stair training, Dry Needling, Joint mobilization, Joint manipulation, Compression bandaging, DME instructions, Cryotherapy, and Moist heat  PLAN FOR NEXT SESSION: complete 6 min walk test and Berg.  Balance HEP    Norman Herrlich, PT 04/29/2023, 3:45 PM

## 2023-05-04 ENCOUNTER — Ambulatory Visit: Payer: Medicare Other | Attending: Neurology | Admitting: Physical Therapy

## 2023-05-04 DIAGNOSIS — R2681 Unsteadiness on feet: Secondary | ICD-10-CM

## 2023-05-04 DIAGNOSIS — M6281 Muscle weakness (generalized): Secondary | ICD-10-CM | POA: Diagnosis present

## 2023-05-04 DIAGNOSIS — R269 Unspecified abnormalities of gait and mobility: Secondary | ICD-10-CM

## 2023-05-04 NOTE — Therapy (Signed)
OUTPATIENT PHYSICAL THERAPY NEURO TREATMENT   Patient Name: Todd Farley MRN: [id number] DOB:[birth year], 78 y.o., male Today's Date: 05/04/2023   PCP: Gracelyn Nurse, MD  REFERRING PROVIDER: Lonell Face, MD   END OF SESSION:  PT End of Session - 05/04/23 1621     Visit Number 3    Number of Visits 24    Date for PT Re-Evaluation 07/20/23    Progress Note Due on Visit 10    PT Start Time 1449    PT Stop Time 1530    PT Time Calculation (min) 41 min    Equipment Utilized During Treatment Gait belt    Activity Tolerance Patient tolerated treatment well    Behavior During Therapy WFL for tasks assessed/performed               Past Medical History:  Diagnosis Date   Actinic keratosis    Anemia    Arthritis    Bronchial stenosis, right    Cancer (HCC)    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   Diabetes mellitus without complication (HCC)    GERD (gastroesophageal reflux disease)    High cholesterol    History of kidney stones    Hypertension    Neuropathy    Umbilical hernia    Past Surgical History:  Procedure Laterality Date   BRONCHIAL WASHINGS N/A 01/16/2023   Procedure: BRONCHIAL WASHINGS;  Surgeon: [name], MD;  Location: [facility] ORS;  Service: Thoracic;  Laterality: N/A;   CARPAL TUNNEL RELEASE Right    COLONOSCOPY WITH PROPOFOL N/A 06/25/2015   Procedure: COLONOSCOPY WITH PROPOFOL;  Surgeon: Scot Jun, MD;  Location: [facility] ENDOSCOPY;  Service: Endoscopy;  Laterality: N/A;   ESOPHAGOGASTRODUODENOSCOPY (EGD) WITH PROPOFOL N/A 06/25/2015   Procedure: ESOPHAGOGASTRODUODENOSCOPY (EGD) WITH PROPOFOL;  Surgeon: Scot Jun, MD;  Location: [facility] ENDOSCOPY;  Service: Endoscopy;  Laterality: N/A;   EYE SURGERY     eyelids x2   EYE SURGERY     cataract x 2 - intraocular implants   FLEXIBLE BRONCHOSCOPY N/A 01/16/2023   Procedure: FLEXIBLE BRONCHOSCOPY;  Surgeon: [name], MD;  Location: [facility] ORS;  Service: Thoracic;  Laterality: N/A;   HERNIA  REPAIR     JOINT REPLACEMENT Right    knee   KNEE ARTHROSCOPY Right    acl repair   Patient Active Problem List   Diagnosis Date Noted   Umbilical hernia without obstruction and without gangrene 01/26/2018   Diabetic polyneuropathy associated with type 2 diabetes mellitus (HCC) 10/13/2017   GERD (gastroesophageal reflux disease) 04/13/2015   Diabetes mellitus type 2, uncomplicated (HCC) 03/14/2014   Hypertension 03/14/2014    ONSET DATE: >1 year  REFERRING DIAG:  Diagnosis  R26.89 (ICD-10-CM) - Imbalance    THERAPY DIAG:  Abnormality of gait and mobility  Unsteadiness on feet  Muscle weakness (generalized)  Rationale for Evaluation and Treatment: Rehabilitation  SUBJECTIVE:  SUBJECTIVE STATEMENT: Pt reports that he is doing well. No new updates. States that he may need to miss PT appointment due to trip with wife.    From eval:  Reports that he has profound neuropathy in bilateral LE. Has had 2 falls in the last year. One when taking garbage can and and then while at the [employer]. Mild-moderate/ head trauma when each fall. Last fall was in the summer 2024. Recently diagnosed "moderately severe arthropathy" in the left hip.  Pain in standing and mobility in the L hip.   Pt accompanied by: self  PERTINENT HISTORY:  Recent MD appointment for hip pain.   "Left hip pain. He has known osteoarthritis. Is been getting worse. He did have PET scan done just recently which had an incidental finding of moderately severe arthropathy in the left hip. Have made referral to orthopedics for further workup and treatment. " From Neurology:  "Patient states his neuropathy symptoms are worse. Increased numbness in his feet. At times is he is not paying attention he does not known where his feet are.  His balance is worse. He has had two falls since his last visit. Using a cane to ambulate now. He was [facility] at [facility] on 09/09/2022 after a fall. This fall was mechanical. He was wheeling his garbage cans to the curb early in the morning on minimal sleep. Prior to this, had a mechanical fall over a terrain change. Has difficulty with prolonged standing secondary to worsening imbalance and pain (left > right). Denies fasciculations, cramping, myalgia, foot drop. "  PAIN:  Are you having pain? Yes: NPRS scale: 5/10  Pain location: bil feel and L hip  Pain description: ache in bil feet. Sharp in the hip  Aggravating factors: weight bearing  Relieving factors: rest.   PRECAUTIONS: Fall  RED FLAGS: None   WEIGHT BEARING RESTRICTIONS: No  FALLS: Has patient fallen in last 6 months? No and multiple near falls   LIVING ENVIRONMENT: Lives with: lives with their spouse Lives in: House/apartment Stairs: Yes: Internal: 15 steps; on left going up and External: 3 steps; on right going up, on left going up, and can reach both Has following equipment at home: Single point cane  PLOF: Independent and Independent with basic ADLs has been using [facility] for roughly the last year.   PATIENT GOALS: improve balance, strength, endurance   OBJECTIVE:  Note: Objective measures were completed at Evaluation unless otherwise noted.  DIAGNOSTIC FINDINGS:  EXAM: NUCLEAR MEDICINE PET SKULL BASE TO THIGH IMPRESSION: 1. No findings suspicious for thoracic malignancy. No definite residual wall thickening or hypermetabolic activity within the proximal right lower lobe bronchus. Correlate with prior bronchoscopy results and consider follow-up chest CT in 3-6 months, as clinically warranted. 2. No suspicious metabolic activity identified within the neck, abdomen or pelvis. 3. Healing fracture of the right 4th rib anteriorly with low level metabolic activity. 4. Hepatic  steatosis and colonic diverticulosis.  COGNITION: Overall cognitive status: Within functional limits for tasks assessed   SENSATION: Hx of diabetic neuropathy in bil ft and ankles. Medial worse than lateral    COORDINATION: Grossly WFL, but mild foot drop on the RLE.   EDEMA:  Mild bil distal edema.   MUSCLE TONE: WFL  MUSCLE LENGTH:   POSTURE: forward head and posterior pelvic tilt  LOWER EXTREMITY ROM:     Grossly WFL   LOWER EXTREMITY MMT:    MMT Right Eval Left Eval  Hip flexion  4 4-  Hip extension    Hip abduction 4+ 4+  Hip adduction 4+ 4  Hip internal rotation    Hip external rotation    Knee flexion 4+ 4-  Knee extension 5 4-  Ankle dorsiflexion 4 4-  Ankle plantarflexion    Ankle inversion    Ankle eversion    (Blank rows = not tested)  TRANSFERS: Assistive device utilized: None  Sit to stand: Modified independence Stand to sit: Complete Independence and Modified independence Chair to chair: Modified independence Floor:  to be assessed. Required assist with recovery from fall > 6 months ago   RAMP:  Level of Assistance: Modified independence Assistive device utilized: Single point cane Ramp Comments:  CURB:  Level of Assistance: Modified independence Assistive device utilized:  rail  Curb Comments:   STAIRS: Level of Assistance: Modified independence Stair Negotiation Technique: Step to Pattern Alternating Pattern  with Bilateral Rails Number of Stairs: 4  Height of Stairs: 6  Comments: step to ascent, step through descent   GAIT: Gait pattern:  excessive hip ER and ankle eversion on the RLE, decreased ankle dorsiflexion- Left, Right foot flat, Left foot flat, lateral hip instability, wide BOS, and poor foot clearance- Left Distance walked: 60 Assistive device utilized: Single point cane and None Level of assistance: Modified independence Comments: noted to have lateral hip instability with increased time in standing   FUNCTIONAL  TESTS:  5 times sit to stand: 15.5 sec  Timed up and go (TUG): 21.54 6 minute walk test: TBD 10 meter walk test: 15.2sec  Berg Balance Scale: TBD   PATIENT SURVEYS:  ABC scale 77.5%                                                                                                                              TREATMENT DATE: 05/04/23 NMR: To facilitate reeducation of movement, balance, posture, coordination, and/or proprioception/kinesthetic sense.  Nustep reciprocal movement and endurance training x 5 min level 2-4 with cues for consistent spm through various resistance. Pt reports   At rail on wall:  No UE support  narrow stance x 30 sec.  Narrow stance eyes open/eyes closed 3x 10 sec.  Semitandem 2 x 20 sec bil   Reciprocal Stepping over cane x 12 bil with 1 UE support and x 8 bil without UE support  Side stepping over cane   Standing on airex pad:  Normal BOS looking at the floor per pt preference 2 x 30 sec without UE support Looking forward 3 x 30 sec without UE support; mild posterior Bias  CGA to correct Head turns R and 2x6 bil no UE support .  Foot tap on 6 inch step x 10 with BUE support x 10 with 1 UE support   Cues for improved use of ankle strategy to correct Lateral/posterior LOB intermittently   Gait belt applied and CGA for safety throughout interventions unless otherwise stated.    PATIENT EDUCATION:  Education details: POC. Benefits of dynamic balance training.  Person educated: Patient Education method: Explanation Education comprehension: verbalized understanding  HOME EXERCISE PROGRAM: Access Code: [email or web address] URL: [email or web address] Date: 04/30/2023 Prepared by: Thresa Ross  Exercises - Small Range Straight Leg Raise  - 1 x daily - 7 x weekly - 3 sets - 10 reps - Supine Bridge  - 3-4 x weekly - 3 sets - 10 reps - Seated Long Arc Quad  - 2-3 x daily - 7 x weekly - 3 sets - 10 reps - Seated Ankle Pumps  - 1 x daily - 7 x weekly - 3  sets - 10 reps - Seated March  - 1 x daily - 7 x weekly - 3 sets - 10 reps - Standing March with Counter Support  - 1 x daily - 7 x weekly - 3 sets - 8 reps - Sit to Stand with Counter Support  - 1 x daily - 7 x weekly - 3 sets - 5 reps   GOALS: Goals reviewed with patient? Yes   SHORT TERM GOALS: Target date: 05/26/2023    Patient will be independent in home exercise program to improve strength/mobility for better functional independence with ADLs. Baseline: to be given  Goal status: INITIAL   LONG TERM GOALS: Target date: 07/21/2023    Patient will increase ABC score by equal to or greater than  8%   to demonstrate statistically significant improvement in mobility and quality of life.  Baseline: 77.5 Goal status: INITIAL  2.  Patient (> 73 years old) will complete five times sit to stand test in < 15 seconds indicating an increased LE strength and improved balance. Baseline:15.5sec Goal status: INITIAL  3.  Patient will increase Berg Balance score by > 6 points to demonstrate decreased fall risk during functional activities Baseline: 41 Goal status: INITIAL  4.  Patient will increase 10 meter walk test to >1.71m/s as to improve gait speed for better community ambulation and to reduce fall risk. Baseline: 0.28m/s(15.2 sec ) Goal status: INITIAL  5.  Patient will reduce timed up and go to <11 seconds to reduce fall risk and demonstrate improved transfer/gait ability. Baseline: 21.54sec Goal status: INITIAL    ASSESSMENT:  CLINICAL IMPRESSION: Pt presents with good motivation for completion of PT activities. Continued dynamic balance interventions to improve righting reaction and increase step length. Tolerated well, but demonstrates significant difficulty with full step length on the RLE as well as poor use of ankle strategy to correct posterior LOB. Pt will continue to benefit from skilled physical therapy intervention to address impairments, improve QOL, and attain  therapy goals.    OBJECTIVE IMPAIRMENTS: Abnormal gait, decreased activity tolerance, decreased balance, decreased endurance, decreased knowledge of condition, decreased knowledge of use of DME, decreased mobility, difficulty walking, decreased ROM, decreased strength, impaired perceived functional ability, impaired flexibility, impaired sensation, impaired UE functional use, and improper body mechanics.   ACTIVITY LIMITATIONS: stairs, transfers, and locomotion level  PARTICIPATION LIMITATIONS: cleaning, laundry, community activity, and yard work  PERSONAL FACTORS: 1-2 comorbidities: moderately severe arthropathy   are also affecting patient's functional outcome.   REHAB POTENTIAL: Good  CLINICAL DECISION MAKING: Stable/uncomplicated  EVALUATION COMPLEXITY: Moderate  PLAN:  PT FREQUENCY: 1-2x/week  PT DURATION: 12 weeks  PLANNED INTERVENTIONS: 97110-Therapeutic exercises, 97530- Therapeutic activity, [id number]- Neuromuscular re-education, 97535- Self Care, [id number]- Manual therapy, [phone]- Gait training, [phone]- Orthotic Fit/training, [phone]- Aquatic Therapy, [phone]- Splinting, Balance training, Stair training, Dry Needling, Joint mobilization, Joint manipulation, Compression bandaging, DME  instructions, Cryotherapy, and Moist heat  PLAN FOR NEXT SESSION: LE strength, balance  Golden Pop, PT 05/04/2023, 4:34 PM

## 2023-05-07 ENCOUNTER — Ambulatory Visit: Payer: Medicare Other | Admitting: Physical Therapy

## 2023-05-08 ENCOUNTER — Ambulatory Visit: Payer: Medicare Other | Admitting: Physical Therapy

## 2023-05-08 DIAGNOSIS — R2681 Unsteadiness on feet: Secondary | ICD-10-CM

## 2023-05-08 DIAGNOSIS — R269 Unspecified abnormalities of gait and mobility: Secondary | ICD-10-CM | POA: Diagnosis not present

## 2023-05-08 DIAGNOSIS — M6281 Muscle weakness (generalized): Secondary | ICD-10-CM

## 2023-05-08 NOTE — Therapy (Signed)
 OUTPATIENT PHYSICAL THERAPY NEURO TREATMENT   Patient Name: TANNER YELEY MRN: [id number] DOB:[birth year], 78 y.o., male Today's Date: 05/08/2023   PCP: Rudolpho Norleen BIRCH, MD  REFERRING PROVIDER: Maree Jannett POUR, MD   END OF SESSION:  PT End of Session - 05/08/23 1017     Visit Number 4    Number of Visits 24    Date for PT Re-Evaluation 07/20/23    Progress Note Due on Visit 10    PT Start Time 1018    PT Stop Time 1100    PT Time Calculation (min) 42 min    Equipment Utilized During Treatment Gait belt    Activity Tolerance Patient tolerated treatment well    Behavior During Therapy WFL for tasks assessed/performed               Past Medical History:  Diagnosis Date   Actinic keratosis    Anemia    Arthritis    Bronchial stenosis, right    Cancer (HCC)    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   Diabetes mellitus without complication (HCC)    GERD (gastroesophageal reflux disease)    High cholesterol    History of kidney stones    Hypertension    Neuropathy    Umbilical hernia    Past Surgical History:  Procedure Laterality Date   BRONCHIAL WASHINGS N/A 01/16/2023   Procedure: BRONCHIAL WASHINGS;  Surgeon: Parris Manna, MD;  Location: [facility] ORS;  Service: Thoracic;  Laterality: N/A;   CARPAL TUNNEL RELEASE Right    COLONOSCOPY WITH PROPOFOL  N/A 06/25/2015   Procedure: COLONOSCOPY WITH PROPOFOL ;  Surgeon: Lamar ONEIDA Holmes, MD;  Location: [facility] ENDOSCOPY;  Service: Endoscopy;  Laterality: N/A;   ESOPHAGOGASTRODUODENOSCOPY (EGD) WITH PROPOFOL  N/A 06/25/2015   Procedure: ESOPHAGOGASTRODUODENOSCOPY (EGD) WITH PROPOFOL ;  Surgeon: Lamar ONEIDA Holmes, MD;  Location: [facility] ENDOSCOPY;  Service: Endoscopy;  Laterality: N/A;   EYE SURGERY     eyelids x2   EYE SURGERY     cataract x 2 - intraocular implants   FLEXIBLE BRONCHOSCOPY N/A 01/16/2023   Procedure: FLEXIBLE BRONCHOSCOPY;  Surgeon: Parris Manna, MD;  Location: [facility] ORS;  Service: Thoracic;  Laterality: N/A;   HERNIA  REPAIR     JOINT REPLACEMENT Right    knee   KNEE ARTHROSCOPY Right    acl repair   Patient Active Problem List   Diagnosis Date Noted   Umbilical hernia without obstruction and without gangrene 01/26/2018   Diabetic polyneuropathy associated with type 2 diabetes mellitus (HCC) 10/13/2017   GERD (gastroesophageal reflux disease) 04/13/2015   Diabetes mellitus type 2, uncomplicated (HCC) 03/14/2014   Hypertension 03/14/2014    ONSET DATE: >1 year  REFERRING DIAG:  Diagnosis  R26.89 (ICD-10-CM) - Imbalance    THERAPY DIAG:  Abnormality of gait and mobility  Unsteadiness on feet  Muscle weakness (generalized)  Rationale for Evaluation and Treatment: Rehabilitation  SUBJECTIVE:  SUBJECTIVE STATEMENT: Pt reports that he is doing well. Has a little pain/soreness in the L hip on this day. Reports that she was up on a ladder cleaning out gutters, which irritated the hip.  Prior to cleaning out gutters, he felt like he had a few good days and exercise with descent strength in the L hip.  Has taken advil and tylenol  today for pain management.   Will have orthopedic assessment for L hip pain on 2/12   From eval:  Reports that he has profound neuropathy in bilateral LE. Has had 2 falls in the last year. One when taking garbage can and and then while at the [employer]. Mild-moderate/ head trauma when each fall. Last fall was in the summer 2024. Recently diagnosed moderately severe arthropathy in the left hip.  Pain in standing and mobility in the L hip.   Pt accompanied by: self  PERTINENT HISTORY:  Recent MD appointment for hip pain.   Left hip pain. He has known osteoarthritis. Is been getting worse. He did have PET scan done just recently which had an incidental finding of moderately  severe arthropathy in the left hip. Have made referral to orthopedics for further workup and treatment.  From Neurology:  Patient states his neuropathy symptoms are worse. Increased numbness in his feet. At times is he is not paying attention he does not known where his feet are. His balance is worse. He has had two falls since his last visit. Using a cane to ambulate now. He was seen at [facility] on 09/09/2022 after a fall. This fall was mechanical. He was wheeling his garbage cans to the curb early in the morning on minimal sleep. Prior to this, had a mechanical fall over a terrain change. Has difficulty with prolonged standing secondary to worsening imbalance and pain (left > right). Denies fasciculations, cramping, myalgia, foot drop.   PAIN:  Are you having pain? Yes: NPRS scale: 5/10  Pain location: bil feel and L hip  Pain description: ache in bil feet. Sharp in the hip  Aggravating factors: weight bearing  Relieving factors: rest.   PRECAUTIONS: Fall  RED FLAGS: None   WEIGHT BEARING RESTRICTIONS: No  FALLS: Has patient fallen in last 6 months? No and multiple near falls   LIVING ENVIRONMENT: Lives with: lives with their spouse Lives in: House/apartment Stairs: Yes: Internal: 15 steps; on left going up and External: 3 steps; on right going up, on left going up, and can reach both Has following equipment at home: Single point cane  PLOF: Independent and Independent with basic ADLs has been using [facility] for roughly the last year.   PATIENT GOALS: improve balance, strength, endurance   OBJECTIVE:  Note: Objective measures were completed at Evaluation unless otherwise noted.  DIAGNOSTIC FINDINGS:  EXAM: NUCLEAR MEDICINE PET SKULL BASE TO THIGH IMPRESSION: 1. No findings suspicious for thoracic malignancy. No definite residual wall thickening or hypermetabolic activity within the proximal right lower lobe bronchus. Correlate with  prior bronchoscopy results and consider follow-up chest CT in 3-6 months, as clinically warranted. 2. No suspicious metabolic activity identified within the neck, abdomen or pelvis. 3. Healing fracture of the right 4th rib anteriorly with low level metabolic activity. 4. Hepatic steatosis and colonic diverticulosis.  COGNITION: Overall cognitive status: Within functional limits for tasks assessed   SENSATION: Hx of diabetic neuropathy in bil ft and ankles. Medial worse than lateral    COORDINATION: Grossly WFL, but mild foot drop  on the RLE.   EDEMA:  Mild bil distal edema.   MUSCLE TONE: WFL  MUSCLE LENGTH:   POSTURE: forward head and posterior pelvic tilt  LOWER EXTREMITY ROM:     Grossly WFL   LOWER EXTREMITY MMT:    MMT Right Eval Left Eval  Hip flexion 4 4-  Hip extension    Hip abduction 4+ 4+  Hip adduction 4+ 4  Hip internal rotation    Hip external rotation    Knee flexion 4+ 4-  Knee extension 5 4-  Ankle dorsiflexion 4 4-  Ankle plantarflexion    Ankle inversion    Ankle eversion    (Blank rows = not tested)  TRANSFERS: Assistive device utilized: None  Sit to stand: Modified independence Stand to sit: Complete Independence and Modified independence Chair to chair: Modified independence Floor:  to be assessed. Required assist with recovery from fall > 6 months ago   RAMP:  Level of Assistance: Modified independence Assistive device utilized: Single point cane Ramp Comments:  CURB:  Level of Assistance: Modified independence Assistive device utilized:  rail  Curb Comments:   STAIRS: Level of Assistance: Modified independence Stair Negotiation Technique: Step to Pattern Alternating Pattern  with Bilateral Rails Number of Stairs: 4  Height of Stairs: 6  Comments: step to ascent, step through descent   GAIT: Gait pattern:  excessive hip ER and ankle eversion on the RLE, decreased ankle dorsiflexion- Left, Right foot flat, Left foot  flat, lateral hip instability, wide BOS, and poor foot clearance- Left Distance walked: 60 Assistive device utilized: Single point cane and None Level of assistance: Modified independence Comments: noted to have lateral hip instability with increased time in standing   FUNCTIONAL TESTS:  5 times sit to stand: 15.5 sec  Timed up and go (TUG): 21.54 6 minute walk test: TBD 10 meter walk test: 15.2sec  Berg Balance Scale: TBD   PATIENT SURVEYS:  ABC scale 77.5%                                                                                                                              TREATMENT DATE: 05/08/23    Nustep reciprocal movement and endurance training x 6 min level 2-4 with cues for consistent spm through various resistance. Pt reports mild Warmth in the L up upon completion.   Seated therex:  LAQ 4#AW x 15 bil with 3 sec hold.  Hip abduction RTB x 15  Isometric hip adduction ball squeeze. X 15   Sit<>stand x 10 without UE support Weighted gait with 4# AW 2 x 182ft   Stepping over bolster with 1 UE support on rail forward x 12 bil . Lateral x 12 on the RLE and x 8 on the LLE, prior to reporting hip pain. Exercises ceased. Pt reports decreased pain after ~ 30 SEC rest break.    Gait belt applied and CGA for safety throughout interventions unless otherwise stated.  PATIENT EDUCATION: Education details: POC. Benefits of dynamic balance training.  Person educated: Patient Education method: Explanation Education comprehension: verbalized understanding  HOME EXERCISE PROGRAM: Access Code: [email or web address] URL: [email or web address] Date: 04/30/2023 Prepared by: Lonni Gainer  Exercises - Small Range Straight Leg Raise  - 1 x daily - 7 x weekly - 3 sets - 10 reps - Supine Bridge  - 3-4 x weekly - 3 sets - 10 reps - Seated Long Arc Quad  - 2-3 x daily - 7 x weekly - 3 sets - 10 reps - Seated Ankle Pumps  - 1 x daily - 7 x weekly - 3 sets - 10 reps -  Seated March  - 1 x daily - 7 x weekly - 3 sets - 10 reps - Standing March with Counter Support  - 1 x daily - 7 x weekly - 3 sets - 8 reps - Sit to Stand with Counter Support  - 1 x daily - 7 x weekly - 3 sets - 5 reps   GOALS: Goals reviewed with patient? Yes   SHORT TERM GOALS: Target date: 05/26/2023    Patient will be independent in home exercise program to improve strength/mobility for better functional independence with ADLs. Baseline: to be given  Goal status: INITIAL   LONG TERM GOALS: Target date: 07/21/2023    Patient will increase ABC score by equal to or greater than  8%   to demonstrate statistically significant improvement in mobility and quality of life.  Baseline: 77.5 Goal status: INITIAL  2.  Patient (> 29 years old) will complete five times sit to stand test in < 15 seconds indicating an increased LE strength and improved balance. Baseline:15.5sec Goal status: INITIAL  3.  Patient will increase Berg Balance score by > 6 points to demonstrate decreased fall risk during functional activities Baseline: 41 Goal status: INITIAL  4.  Patient will increase 10 meter walk test to >1.3m/s as to improve gait speed for better community ambulation and to reduce fall risk. Baseline: 0.56m/s(15.2 sec ) Goal status: INITIAL  5.  Patient will reduce timed up and go to <11 seconds to reduce fall risk and demonstrate improved transfer/gait ability. Baseline: 21.54sec Goal status: INITIAL    ASSESSMENT:  CLINICAL IMPRESSION: Pt presents with good motivation for completion of PT activities. PT treatment focused on improved BLE strengthening and step length through weighted gait training. Pt reports mild L hip pain with lateral stepping over bolster, pain decreased with rest break.  Pt will continue to benefit from skilled physical therapy intervention to address impairments, improve QOL, and attain therapy goals.    OBJECTIVE IMPAIRMENTS: Abnormal gait, decreased  activity tolerance, decreased balance, decreased endurance, decreased knowledge of condition, decreased knowledge of use of DME, decreased mobility, difficulty walking, decreased ROM, decreased strength, impaired perceived functional ability, impaired flexibility, impaired sensation, impaired UE functional use, and improper body mechanics.   ACTIVITY LIMITATIONS: stairs, transfers, and locomotion level  PARTICIPATION LIMITATIONS: cleaning, laundry, community activity, and yard work  PERSONAL FACTORS: 1-2 comorbidities: moderately severe arthropathy   are also affecting patient's functional outcome.   REHAB POTENTIAL: Good  CLINICAL DECISION MAKING: Stable/uncomplicated  EVALUATION COMPLEXITY: Moderate  PLAN:  PT FREQUENCY: 1-2x/week  PT DURATION: 12 weeks  PLANNED INTERVENTIONS: 97110-Therapeutic exercises, 97530- Therapeutic activity, [id number]- Neuromuscular re-education, 97535- Self Care, [id number]- Manual therapy, [phone]- Gait training, [phone]- Orthotic Fit/training, [phone]- Aquatic Therapy, [phone]- Splinting, Balance training, Stair training, Dry Needling, Joint mobilization, Joint manipulation, Compression bandaging, DME instructions, Cryotherapy, and Moist  heat  PLAN FOR NEXT SESSION:   Continues BLE strength, dynamic  balance  Massie FORBES Dollar, PT 05/08/2023, 10:17 AM

## 2023-05-11 ENCOUNTER — Ambulatory Visit: Payer: Medicare Other | Admitting: Physical Therapy

## 2023-05-11 DIAGNOSIS — M6281 Muscle weakness (generalized): Secondary | ICD-10-CM

## 2023-05-11 DIAGNOSIS — R269 Unspecified abnormalities of gait and mobility: Secondary | ICD-10-CM

## 2023-05-11 DIAGNOSIS — R2681 Unsteadiness on feet: Secondary | ICD-10-CM

## 2023-05-11 NOTE — Therapy (Signed)
 OUTPATIENT PHYSICAL THERAPY NEURO TREATMENT   Patient Name: Todd Farley MRN: [id number] DOB:1945/05/10, 78 y.o., male Today's Date: 05/11/2023   PCP: Little Riff, MD  REFERRING PROVIDER: Rosan Comfort, MD   END OF SESSION:  PT End of Session - 05/11/23 1448     Visit Number 5    Number of Visits 24    Date for PT Re-Evaluation 07/20/23    Progress Note Due on Visit 10    PT Start Time 1448    PT Stop Time 1530    PT Time Calculation (min) 42 min    Equipment Utilized During Treatment Gait belt    Activity Tolerance Patient tolerated treatment well    Behavior During Therapy WFL for tasks assessed/performed               Past Medical History:  Diagnosis Date   Actinic keratosis    Anemia    Arthritis    Bronchial stenosis, right    Cancer (HCC)    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   Diabetes mellitus without complication (HCC)    GERD (gastroesophageal reflux disease)    High cholesterol    History of kidney stones    Hypertension    Neuropathy    Umbilical hernia    Past Surgical History:  Procedure Laterality Date   BRONCHIAL WASHINGS N/A 01/16/2023   Procedure: BRONCHIAL WASHINGS;  Surgeon: Erskin Hearing, MD;  Location: [facility] ORS;  Service: Thoracic;  Laterality: N/A;   CARPAL TUNNEL RELEASE Right    COLONOSCOPY WITH PROPOFOL  N/A 06/25/2015   Procedure: COLONOSCOPY WITH PROPOFOL ;  Surgeon: Cassie Click, MD;  Location: [facility] ENDOSCOPY;  Service: Endoscopy;  Laterality: N/A;   ESOPHAGOGASTRODUODENOSCOPY (EGD) WITH PROPOFOL  N/A 06/25/2015   Procedure: ESOPHAGOGASTRODUODENOSCOPY (EGD) WITH PROPOFOL ;  Surgeon: Cassie Click, MD;  Location: [facility] ENDOSCOPY;  Service: Endoscopy;  Laterality: N/A;   EYE SURGERY     eyelids x2   EYE SURGERY     cataract x 2 - intraocular implants   FLEXIBLE BRONCHOSCOPY N/A 01/16/2023   Procedure: FLEXIBLE BRONCHOSCOPY;  Surgeon: Erskin Hearing, MD;  Location: [facility] ORS;  Service: Thoracic;  Laterality: N/A;   HERNIA  REPAIR     JOINT REPLACEMENT Right    knee   KNEE ARTHROSCOPY Right    acl repair   Patient Active Problem List   Diagnosis Date Noted   Umbilical hernia without obstruction and without gangrene 01/26/2018   Diabetic polyneuropathy associated with type 2 diabetes mellitus (HCC) 10/13/2017   GERD (gastroesophageal reflux disease) 04/13/2015   Diabetes mellitus type 2, uncomplicated (HCC) 03/14/2014   Hypertension 03/14/2014    ONSET DATE: >1 year  REFERRING DIAG:  Diagnosis  R26.89 (ICD-10-CM) - Imbalance    THERAPY DIAG:  Abnormality of gait and mobility  Unsteadiness on feet  Muscle weakness (generalized)  Rationale for Evaluation and Treatment: Rehabilitation  SUBJECTIVE:  SUBJECTIVE STATEMENT:  Pt reports that he is doing well today. States that he obtained ankle weights on Friday last week. He reports wearing them for additional therex and while ambulating off and on throughout the Friday. Notes severe soreness on Saturday and Sunday into the hip.    From eval:  Reports that he has profound neuropathy in bilateral LE. Has had 2 falls in the last year. One when taking garbage can and and then while at the [employer]. Mild-moderate/ head trauma when each fall. Last fall was in the summer 2024. Recently diagnosed "moderately severe arthropathy" in the left hip.  Pain in standing and mobility in the L hip.   Pt accompanied by: self  PERTINENT HISTORY:  Recent MD appointment for hip pain.   "Left hip pain. He has known osteoarthritis. Is been getting worse. He did have PET scan done just recently which had an incidental finding of moderately severe arthropathy in the left hip. Have made referral to orthopedics for further workup and treatment. " From Neurology:  "Patient states  his neuropathy symptoms are worse. Increased numbness in his feet. At times is he is not paying attention he does not known where his feet are. His balance is worse. He has had two falls since his last visit. Using a cane to ambulate now. He was [facility] at [facility] on 09/09/2022 after a fall. This fall was mechanical. He was wheeling his garbage cans to the curb early in the morning on minimal sleep. Prior to this, had a mechanical fall over a terrain change. Has difficulty with prolonged standing secondary to worsening imbalance and pain (left > right). Denies fasciculations, cramping, myalgia, foot drop. "  PAIN:  Are you having pain? Yes: NPRS scale: 5/10  Pain location: bil feel and L hip  Pain description: ache in bil feet. Sharp in the hip  Aggravating factors: weight bearing  Relieving factors: rest.   PRECAUTIONS: Fall  RED FLAGS: None   WEIGHT BEARING RESTRICTIONS: No  FALLS: Has patient fallen in last 6 months? No and multiple near falls   LIVING ENVIRONMENT: Lives with: lives with their spouse Lives in: House/apartment Stairs: Yes: Internal: 15 steps; on left going up and External: 3 steps; on right going up, on left going up, and can reach both Has following equipment at home: Single point cane  PLOF: Independent and Independent with basic ADLs has been using [facility] for roughly the last year.   PATIENT GOALS: improve balance, strength, endurance   OBJECTIVE:  Note: Objective measures were completed at Evaluation unless otherwise noted.  DIAGNOSTIC FINDINGS:  EXAM: NUCLEAR MEDICINE PET SKULL BASE TO THIGH IMPRESSION: 1. No findings suspicious for thoracic malignancy. No definite residual wall thickening or hypermetabolic activity within the proximal right lower lobe bronchus. Correlate with prior bronchoscopy results and consider follow-up chest CT in 3-6 months, as clinically warranted. 2. No suspicious metabolic activity identified  within the neck, abdomen or pelvis. 3. Healing fracture of the right 4th rib anteriorly with low level metabolic activity. 4. Hepatic steatosis and colonic diverticulosis.  COGNITION: Overall cognitive status: Within functional limits for tasks assessed   SENSATION: Hx of diabetic neuropathy in bil ft and ankles. Medial worse than lateral    COORDINATION: Grossly WFL, but mild foot drop on the RLE.   EDEMA:  Mild bil distal edema.   MUSCLE TONE: WFL  MUSCLE LENGTH:   POSTURE: forward head and posterior pelvic tilt  LOWER EXTREMITY ROM:  Grossly WFL   LOWER EXTREMITY MMT:    MMT Right Eval Left Eval  Hip flexion 4 4-  Hip extension    Hip abduction 4+ 4+  Hip adduction 4+ 4  Hip internal rotation    Hip external rotation    Knee flexion 4+ 4-  Knee extension 5 4-  Ankle dorsiflexion 4 4-  Ankle plantarflexion    Ankle inversion    Ankle eversion    (Blank rows = not tested)  TRANSFERS: Assistive device utilized: None  Sit to stand: Modified independence Stand to sit: Complete Independence and Modified independence Chair to chair: Modified independence Floor:  to be assessed. Required assist with recovery from fall > 6 months ago   RAMP:  Level of Assistance: Modified independence Assistive device utilized: Single point cane Ramp Comments:  CURB:  Level of Assistance: Modified independence Assistive device utilized:  rail  Curb Comments:   STAIRS: Level of Assistance: Modified independence Stair Negotiation Technique: Step to Pattern Alternating Pattern  with Bilateral Rails Number of Stairs: 4  Height of Stairs: 6  Comments: step to ascent, step through descent   GAIT: Gait pattern:  excessive hip ER and ankle eversion on the RLE, decreased ankle dorsiflexion- Left, Right foot flat, Left foot flat, lateral hip instability, wide BOS, and poor foot clearance- Left Distance walked: 60 Assistive device utilized: Single point cane and  None Level of assistance: Modified independence Comments: noted to have lateral hip instability with increased time in standing   FUNCTIONAL TESTS:  5 times sit to stand: 15.5 sec  Timed up and go (TUG): 21.54 6 minute walk test: TBD 10 meter walk test: 15.2sec  Berg Balance Scale: TBD   PATIENT SURVEYS:  ABC scale 77.5%                                                                                                                              TREATMENT DATE: 05/11/23  Standing on airex pad:  Normal BOS 2 x 30 sec Partial semitandem x 3 x 20  sec bil  1 foot on pad 1 foot on 6 inch step 3 x 15 sec bil  Eyes open/eyes closed 10 sec x 4  Foot tap on 6 inch step from pad x 12 bil with light UE support   Forward/reverse gait 5 x31ft without UE support; cues for improved step length in reverse as tolerated.   Side stepping R and L without UE support 77ft x 5 bil cues for reduced trunk sway R and L.  Single limb reciprocal step over 4 inch hurdle x 10 bil with LUE support throughout.    Gait belt applied and CGA for safety throughout interventions unless otherwise stated.    PATIENT EDUCATION: Education details: POC. Benefits of dynamic balance training.  Person educated: Patient Education method: Explanation Education comprehension: verbalized understanding  HOME EXERCISE PROGRAM: Access Code: [email or web address] URL: [email or web address] Date: 04/30/2023 Prepared by: Marlynn Singer  Exercises -  Small Range Straight Leg Raise  - 1 x daily - 7 x weekly - 3 sets - 10 reps - Supine Bridge  - 3-4 x weekly - 3 sets - 10 reps - Seated Long Arc Quad  - 2-3 x daily - 7 x weekly - 3 sets - 10 reps - Seated Ankle Pumps  - 1 x daily - 7 x weekly - 3 sets - 10 reps - Seated March  - 1 x daily - 7 x weekly - 3 sets - 10 reps - Standing March with Counter Support  - 1 x daily - 7 x weekly - 3 sets - 8 reps - Sit to Stand with Counter Support  - 1 x daily - 7 x weekly - 3 sets - 5  reps   GOALS: Goals reviewed with patient? Yes   SHORT TERM GOALS: Target date: 05/26/2023    Patient will be independent in home exercise program to improve strength/mobility for better functional independence with ADLs. Baseline: to be given  Goal status: INITIAL   LONG TERM GOALS: Target date: 07/21/2023    Patient will increase ABC score by equal to or greater than  8%   to demonstrate statistically significant improvement in mobility and quality of life.  Baseline: 77.5 Goal status: INITIAL  2.  Patient (> 50 years old) will complete five times sit to stand test in < 15 seconds indicating an increased LE strength and improved balance. Baseline:15.5sec Goal status: INITIAL  3.  Patient will increase Berg Balance score by > 6 points to demonstrate decreased fall risk during functional activities Baseline: 41 Goal status: INITIAL  4.  Patient will increase 10 meter walk test to >1.88m/s as to improve gait speed for better community ambulation and to reduce fall risk. Baseline: 0.68m/s(15.2 sec ) Goal status: INITIAL  5.  Patient will reduce timed up and go to <11 seconds to reduce fall risk and demonstrate improved transfer/gait ability. Baseline: 21.54sec Goal status: INITIAL    ASSESSMENT:  CLINICAL IMPRESSION: Pt presents with good motivation for completion of PT activities. PT treatment focused on improved balance and righting reactions after reporting increased pain in the L hip following last PT treatment and strength training at home on Friday. Tolerated well, and was able to demonstrate improved step length and ability to correct posterior LOB with increased repetitions. Mild discomfort with side stepping in the L hip, that improved with seated rest break.  Pt will continue to benefit from skilled physical therapy intervention to address impairments, improve QOL, and attain therapy goals.    OBJECTIVE IMPAIRMENTS: Abnormal gait, decreased activity tolerance,  decreased balance, decreased endurance, decreased knowledge of condition, decreased knowledge of use of DME, decreased mobility, difficulty walking, decreased ROM, decreased strength, impaired perceived functional ability, impaired flexibility, impaired sensation, impaired UE functional use, and improper body mechanics.   ACTIVITY LIMITATIONS: stairs, transfers, and locomotion level  PARTICIPATION LIMITATIONS: cleaning, laundry, community activity, and yard work  PERSONAL FACTORS: 1-2 comorbidities: moderately severe arthropathy   are also affecting patient's functional outcome.   REHAB POTENTIAL: Good  CLINICAL DECISION MAKING: Stable/uncomplicated  EVALUATION COMPLEXITY: Moderate  PLAN:  PT FREQUENCY: 1-2x/week  PT DURATION: 12 weeks  PLANNED INTERVENTIONS: 97110-Therapeutic exercises, 97530- Therapeutic activity, [id number]- Neuromuscular re-education, 97535- Self Care, [id number]- Manual therapy, [id number]- Gait training, [phone]- Orthotic Fit/training, [phone]- Aquatic Therapy, [phone]- Splinting, Balance training, Stair training, Dry Needling, Joint mobilization, Joint manipulation, Compression bandaging, DME instructions, Cryotherapy, and Moist heat  PLAN FOR NEXT SESSION:  Continues BLE strength, dynamic  balance  Barbara Book, PT, DPT  Physical Therapist - [facility]  3:34 PM 05/11/23   05/11/2023, 2:51 PM

## 2023-05-13 ENCOUNTER — Ambulatory Visit: Payer: Medicare Other | Admitting: Physical Therapy

## 2023-05-13 DIAGNOSIS — R269 Unspecified abnormalities of gait and mobility: Secondary | ICD-10-CM | POA: Diagnosis not present

## 2023-05-13 DIAGNOSIS — M6281 Muscle weakness (generalized): Secondary | ICD-10-CM

## 2023-05-13 DIAGNOSIS — R2681 Unsteadiness on feet: Secondary | ICD-10-CM

## 2023-05-13 NOTE — Therapy (Signed)
OUTPATIENT PHYSICAL THERAPY NEURO TREATMENT   Patient Name: Todd Farley MRN: [id number] DOB:Nov 22, 1945, 78 y.o., male Today's Date: 05/13/2023   PCP: Gracelyn Nurse, MD  REFERRING PROVIDER: Lonell Face, MD   END OF SESSION:  PT End of Session - 05/13/23 1458     Visit Number 6    Number of Visits 24    Date for PT Re-Evaluation 07/20/23    Progress Note Due on Visit 10    PT Start Time 1454    PT Stop Time 1534    PT Time Calculation (min) 40 min    Equipment Utilized During Treatment Gait belt    Activity Tolerance Patient tolerated treatment well    Behavior During Therapy WFL for tasks assessed/performed               Past Medical History:  Diagnosis Date   Actinic keratosis    Anemia    Arthritis    Bronchial stenosis, right    Cancer (HCC)    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   Diabetes mellitus without complication (HCC)    GERD (gastroesophageal reflux disease)    High cholesterol    History of kidney stones    Hypertension    Neuropathy    Umbilical hernia    Past Surgical History:  Procedure Laterality Date   BRONCHIAL WASHINGS N/A 01/16/2023   Procedure: BRONCHIAL WASHINGS;  Surgeon: [name], MD;  Location: [facility] ORS;  Service: Thoracic;  Laterality: N/A;   CARPAL TUNNEL RELEASE Right    COLONOSCOPY WITH PROPOFOL N/A 06/25/2015   Procedure: COLONOSCOPY WITH PROPOFOL;  Surgeon: Scot Jun, MD;  Location: [facility] ENDOSCOPY;  Service: Endoscopy;  Laterality: N/A;   ESOPHAGOGASTRODUODENOSCOPY (EGD) WITH PROPOFOL N/A 06/25/2015   Procedure: ESOPHAGOGASTRODUODENOSCOPY (EGD) WITH PROPOFOL;  Surgeon: Scot Jun, MD;  Location: [facility] ENDOSCOPY;  Service: Endoscopy;  Laterality: N/A;   EYE SURGERY     eyelids x2   EYE SURGERY     cataract x 2 - intraocular implants   FLEXIBLE BRONCHOSCOPY N/A 01/16/2023   Procedure: FLEXIBLE BRONCHOSCOPY;  Surgeon: [name], MD;  Location: [facility] ORS;  Service: Thoracic;  Laterality: N/A;   HERNIA  REPAIR     JOINT REPLACEMENT Right    knee   KNEE ARTHROSCOPY Right    acl repair   Patient Active Problem List   Diagnosis Date Noted   Umbilical hernia without obstruction and without gangrene 01/26/2018   Diabetic polyneuropathy associated with type 2 diabetes mellitus (HCC) 10/13/2017   GERD (gastroesophageal reflux disease) 04/13/2015   Diabetes mellitus type 2, uncomplicated (HCC) 03/14/2014   Hypertension 03/14/2014    ONSET DATE: >1 year  REFERRING DIAG:  Diagnosis  R26.89 (ICD-10-CM) - Imbalance    THERAPY DIAG:  Abnormality of gait and mobility  Unsteadiness on feet  Muscle weakness (generalized)  Rationale for Evaluation and Treatment: Rehabilitation  SUBJECTIVE:  SUBJECTIVE STATEMENT:  Pt reports that he is doing well today. Was seen by orthopedic MD to assess L hip pain. Pt reports that the plan will be to obtain THA when schedule allows. Pt reports surgery schedule may not allow replacement until the end of March.    From eval:  Reports that he has profound neuropathy in bilateral LE. Has had 2 falls in the last year. One when taking garbage can and and then while at the [employer]. Mild-moderate/ head trauma when each fall. Last fall was in the summer 2024. Recently diagnosed "moderately severe arthropathy" in the left hip.  Pain in standing and mobility in the L hip.   Pt accompanied by: self  PERTINENT HISTORY:  Recent MD appointment for hip pain.   "Left hip pain. He has known osteoarthritis. Is been getting worse. He did have PET scan done just recently which had an incidental finding of moderately severe arthropathy in the left hip. Have made referral to orthopedics for further workup and treatment. " From Neurology:  "Patient states his neuropathy symptoms  are worse. Increased numbness in his feet. At times is he is not paying attention he does not known where his feet are. His balance is worse. He has had two falls since his last visit. Using a cane to ambulate now. He was seen at [facility] on 09/09/2022 after a fall. This fall was mechanical. He was wheeling his garbage cans to the curb early in the morning on minimal sleep. Prior to this, had a mechanical fall over a terrain change. Has difficulty with prolonged standing secondary to worsening imbalance and pain (left > right). Denies fasciculations, cramping, myalgia, foot drop. "  PAIN:  Are you having pain? Yes: NPRS scale: 5/10  Pain location: bil feel and L hip  Pain description: ache in bil feet. Sharp in the hip  Aggravating factors: weight bearing  Relieving factors: rest.   PRECAUTIONS: Fall  RED FLAGS: None   WEIGHT BEARING RESTRICTIONS: No  FALLS: Has patient fallen in last 6 months? No and multiple near falls   LIVING ENVIRONMENT: Lives with: lives with their spouse Lives in: House/apartment Stairs: Yes: Internal: 15 steps; on left going up and External: 3 steps; on right going up, on left going up, and can reach both Has following equipment at home: Single point cane  PLOF: Independent and Independent with basic ADLs has been using [facility] for roughly the last year.   PATIENT GOALS: improve balance, strength, endurance   OBJECTIVE:  Note: Objective measures were completed at Evaluation unless otherwise noted.  DIAGNOSTIC FINDINGS:  EXAM: NUCLEAR MEDICINE PET SKULL BASE TO THIGH IMPRESSION: 1. No findings suspicious for thoracic malignancy. No definite residual wall thickening or hypermetabolic activity within the proximal right lower lobe bronchus. Correlate with prior bronchoscopy results and consider follow-up chest CT in 3-6 months, as clinically warranted. 2. No suspicious metabolic activity identified within the  neck, abdomen or pelvis. 3. Healing fracture of the right 4th rib anteriorly with low level metabolic activity. 4. Hepatic steatosis and colonic diverticulosis.  COGNITION: Overall cognitive status: Within functional limits for tasks assessed   SENSATION: Hx of diabetic neuropathy in bil ft and ankles. Medial worse than lateral    COORDINATION: Grossly WFL, but mild foot drop on the RLE.   EDEMA:  Mild bil distal edema.   MUSCLE TONE: WFL  MUSCLE LENGTH:   POSTURE: forward head and posterior pelvic tilt  LOWER EXTREMITY ROM:  Grossly WFL   LOWER EXTREMITY MMT:    MMT Right Eval Left Eval  Hip flexion 4 4-  Hip extension    Hip abduction 4+ 4+  Hip adduction 4+ 4  Hip internal rotation    Hip external rotation    Knee flexion 4+ 4-  Knee extension 5 4-  Ankle dorsiflexion 4 4-  Ankle plantarflexion    Ankle inversion    Ankle eversion    (Blank rows = not tested)  TRANSFERS: Assistive device utilized: None  Sit to stand: Modified independence Stand to sit: Complete Independence and Modified independence Chair to chair: Modified independence Floor:  to be assessed. Required assist with recovery from fall > 6 months ago   RAMP:  Level of Assistance: Modified independence Assistive device utilized: Single point cane Ramp Comments:  CURB:  Level of Assistance: Modified independence Assistive device utilized:  rail  Curb Comments:   STAIRS: Level of Assistance: Modified independence Stair Negotiation Technique: Step to Pattern Alternating Pattern  with Bilateral Rails Number of Stairs: 4  Height of Stairs: 6  Comments: step to ascent, step through descent   GAIT: Gait pattern:  excessive hip ER and ankle eversion on the RLE, decreased ankle dorsiflexion- Left, Right foot flat, Left foot flat, lateral hip instability, wide BOS, and poor foot clearance- Left Distance walked: 60 Assistive device utilized: Single point cane and None Level of  assistance: Modified independence Comments: noted to have lateral hip instability with increased time in standing   FUNCTIONAL TESTS:  5 times sit to stand: 15.5 sec  Timed up and go (TUG): 21.54 6 minute walk test: TBD 10 meter walk test: 15.2sec  Berg Balance Scale: TBD   PATIENT SURVEYS:  ABC scale 77.5%                                                                                                                              TREATMENT DATE: 05/13/23  Gait through rehab gym without AD x 10ft. Weighted gait with SPC x 26ft; 5#AW.    Sit<>supine without Assist:  Supine therex:  SAQ x 15 bil  SLR x 10 bil  Bridge RTB x 5   Reciprocal hip flexion no resistance x 12   Sidelying  Clam shell x 12  Hip abduction x 10 with AAROM due to pain in the hip joint.   Sitting  ankle PF/DF EOM.  HS curl x 12 RTB  LAQ 5#AW x 20 bil   Standing  Hip extension x 8 bil 5#AW.   Cues for improved ROM and eccentric control in pain free range. Mild pain reported with bridge and hip abduction in sidelying    PATIENT EDUCATION: Education details: POC. Benefits of dynamic balance training.  Person educated: Patient Education method: Explanation Education comprehension: verbalized understanding  HOME EXERCISE PROGRAM: Access Code: [email or web address] URL: [email or web address] Date: 04/30/2023 Prepared by: Thresa Ross  Exercises - Small Range Straight Leg Raise  - 1  x daily - 7 x weekly - 3 sets - 10 reps - Supine Bridge  - 3-4 x weekly - 3 sets - 10 reps - Seated Long Arc Quad  - 2-3 x daily - 7 x weekly - 3 sets - 10 reps - Seated Ankle Pumps  - 1 x daily - 7 x weekly - 3 sets - 10 reps - Seated March  - 1 x daily - 7 x weekly - 3 sets - 10 reps - Standing March with Counter Support  - 1 x daily - 7 x weekly - 3 sets - 8 reps - Sit to Stand with Counter Support  - 1 x daily - 7 x weekly - 3 sets - 5 reps   GOALS: Goals reviewed with patient? Yes   SHORT TERM GOALS:  Target date: 05/26/2023    Patient will be independent in home exercise program to improve strength/mobility for better functional independence with ADLs. Baseline: to be given  Goal status: INITIAL   LONG TERM GOALS: Target date: 07/21/2023    Patient will increase ABC score by equal to or greater than  8%   to demonstrate statistically significant improvement in mobility and quality of life.  Baseline: 77.5 Goal status: INITIAL  2.  Patient (> 5 years old) will complete five times sit to stand test in < 15 seconds indicating an increased LE strength and improved balance. Baseline:15.5sec Goal status: INITIAL  3.  Patient will increase Berg Balance score by > 6 points to demonstrate decreased fall risk during functional activities Baseline: 41 Goal status: INITIAL  4.  Patient will increase 10 meter walk test to >1.68m/s as to improve gait speed for better community ambulation and to reduce fall risk. Baseline: 0.41m/s(15.2 sec ) Goal status: INITIAL  5.  Patient will reduce timed up and go to <11 seconds to reduce fall risk and demonstrate improved transfer/gait ability. Baseline: 21.54sec Goal status: INITIAL    ASSESSMENT:  CLINICAL IMPRESSION: Pt presents with good motivation for completion of PT activities.  Pt reporting to PT following MD appointment. Will need THA in the near future. Recommendation for continued PT to maintain hip strength and ROM. Tolerated supine therex well, but limited ROM due to pain with bridge and hip abduction. Pt will continue to benefit from skilled physical therapy intervention to address impairments, improve QOL, and attain therapy goals.    OBJECTIVE IMPAIRMENTS: Abnormal gait, decreased activity tolerance, decreased balance, decreased endurance, decreased knowledge of condition, decreased knowledge of use of DME, decreased mobility, difficulty walking, decreased ROM, decreased strength, impaired perceived functional ability, impaired  flexibility, impaired sensation, impaired UE functional use, and improper body mechanics.   ACTIVITY LIMITATIONS: stairs, transfers, and locomotion level  PARTICIPATION LIMITATIONS: cleaning, laundry, community activity, and yard work  PERSONAL FACTORS: 1-2 comorbidities: moderately severe arthropathy   are also affecting patient's functional outcome.   REHAB POTENTIAL: Good  CLINICAL DECISION MAKING: Stable/uncomplicated  EVALUATION COMPLEXITY: Moderate  PLAN:  PT FREQUENCY: 1-2x/week  PT DURATION: 12 weeks  PLANNED INTERVENTIONS: 97110-Therapeutic exercises, 97530- Therapeutic activity, 97112- Neuromuscular re-education, 97535- Self Care, 16109- Manual therapy, [phone]- Gait training, [phone]- Orthotic Fit/training, [phone]- Aquatic Therapy, [phone]- Splinting, Balance training, Stair training, Dry Needling, Joint mobilization, Joint manipulation, Compression bandaging, DME instructions, Cryotherapy, and Moist heat  PLAN FOR NEXT SESSION:   Continues BLE strength, dynamic  balance  Golden Pop, PT, DPT  Physical Therapist - [facility]  2:59 PM 05/13/23   05/13/2023,  2:59 PM

## 2023-05-18 ENCOUNTER — Ambulatory Visit: Payer: Medicare Other

## 2023-05-18 DIAGNOSIS — M6281 Muscle weakness (generalized): Secondary | ICD-10-CM

## 2023-05-18 DIAGNOSIS — R2681 Unsteadiness on feet: Secondary | ICD-10-CM

## 2023-05-18 DIAGNOSIS — R269 Unspecified abnormalities of gait and mobility: Secondary | ICD-10-CM | POA: Diagnosis not present

## 2023-05-18 NOTE — Therapy (Signed)
OUTPATIENT PHYSICAL THERAPY NEURO TREATMENT   Patient Name: Todd Farley MRN: [id number] DOB:Sep 11, 1945, 78 y.o., male Today's Date: 05/18/2023   PCP: Gracelyn Nurse, MD  REFERRING PROVIDER: Lonell Face, MD   END OF SESSION:  PT End of Session - 05/18/23 1445     Visit Number 7    Number of Visits 24    Date for PT Re-Evaluation 07/20/23    Progress Note Due on Visit 10    PT Start Time 1446    PT Stop Time 1525    PT Time Calculation (min) 39 min    Equipment Utilized During Treatment Gait belt    Activity Tolerance Patient tolerated treatment well    Behavior During Therapy WFL for tasks assessed/performed               Past Medical History:  Diagnosis Date   Actinic keratosis    Anemia    Arthritis    Bronchial stenosis, right    Cancer (HCC)    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   Diabetes mellitus without complication (HCC)    GERD (gastroesophageal reflux disease)    High cholesterol    History of kidney stones    Hypertension    Neuropathy    Umbilical hernia    Past Surgical History:  Procedure Laterality Date   BRONCHIAL WASHINGS N/A 01/16/2023   Procedure: BRONCHIAL WASHINGS;  Surgeon: [name], MD;  Location: [facility] ORS;  Service: Thoracic;  Laterality: N/A;   CARPAL TUNNEL RELEASE Right    COLONOSCOPY WITH PROPOFOL N/A 06/25/2015   Procedure: COLONOSCOPY WITH PROPOFOL;  Surgeon: Scot Jun, MD;  Location: [facility] ENDOSCOPY;  Service: Endoscopy;  Laterality: N/A;   ESOPHAGOGASTRODUODENOSCOPY (EGD) WITH PROPOFOL N/A 06/25/2015   Procedure: ESOPHAGOGASTRODUODENOSCOPY (EGD) WITH PROPOFOL;  Surgeon: Scot Jun, MD;  Location: [facility] ENDOSCOPY;  Service: Endoscopy;  Laterality: N/A;   EYE SURGERY     eyelids x2   EYE SURGERY     cataract x 2 - intraocular implants   FLEXIBLE BRONCHOSCOPY N/A 01/16/2023   Procedure: FLEXIBLE BRONCHOSCOPY;  Surgeon: [name], MD;  Location: [facility] ORS;  Service: Thoracic;  Laterality: N/A;   HERNIA  REPAIR     JOINT REPLACEMENT Right    knee   KNEE ARTHROSCOPY Right    acl repair   Patient Active Problem List   Diagnosis Date Noted   Umbilical hernia without obstruction and without gangrene 01/26/2018   Diabetic polyneuropathy associated with type 2 diabetes mellitus (HCC) 10/13/2017   GERD (gastroesophageal reflux disease) 04/13/2015   Diabetes mellitus type 2, uncomplicated (HCC) 03/14/2014   Hypertension 03/14/2014    ONSET DATE: >1 year  REFERRING DIAG:  Diagnosis  R26.89 (ICD-10-CM) - Imbalance    THERAPY DIAG:  No diagnosis found.  Rationale for Evaluation and Treatment: Rehabilitation  SUBJECTIVE:  SUBJECTIVE STATEMENT:  Pt reports he's not having a great day today.  Pt noting increased pain in the L hip than in prior sessions.     From eval:  Reports that he has profound neuropathy in bilateral LE. Has had 2 falls in the last year. One when taking garbage can and and then while at the [employer]. Mild-moderate/ head trauma when each fall. Last fall was in the summer 2024. Recently diagnosed "moderately severe arthropathy" in the left hip.  Pain in standing and mobility in the L hip.   Pt accompanied by: self  PERTINENT HISTORY:  Recent MD appointment for hip pain.   "Left hip pain. He has known osteoarthritis. Is been getting worse. He did have PET scan done just recently which had an incidental finding of moderately severe arthropathy in the left hip. Have made referral to orthopedics for further workup and treatment. " From Neurology:  "Patient states his neuropathy symptoms are worse. Increased numbness in his feet. At times is he is not paying attention he does not known where his feet are. His balance is worse. He has had two falls since his last visit. Using a cane  to ambulate now. He was seen at [facility] on 09/09/2022 after a fall. This fall was mechanical. He was wheeling his garbage cans to the curb early in the morning on minimal sleep. Prior to this, had a mechanical fall over a terrain change. Has difficulty with prolonged standing secondary to worsening imbalance and pain (left > right). Denies fasciculations, cramping, myalgia, foot drop. "  PAIN:  Are you having pain? Yes: NPRS scale: 5/10  Pain location: bil feel and L hip  Pain description: ache in bil feet. Sharp in the hip  Aggravating factors: weight bearing  Relieving factors: rest.   PRECAUTIONS: Fall  RED FLAGS: None   WEIGHT BEARING RESTRICTIONS: No  FALLS: Has patient fallen in last 6 months? No and multiple near falls   LIVING ENVIRONMENT: Lives with: lives with their spouse Lives in: House/apartment Stairs: Yes: Internal: 15 steps; on left going up and External: 3 steps; on right going up, on left going up, and can reach both Has following equipment at home: Single point cane  PLOF: Independent and Independent with basic ADLs has been using [facility] for roughly the last year.   PATIENT GOALS: improve balance, strength, endurance   OBJECTIVE:  Note: Objective measures were completed at Evaluation unless otherwise noted.  DIAGNOSTIC FINDINGS:  EXAM: NUCLEAR MEDICINE PET SKULL BASE TO THIGH IMPRESSION: 1. No findings suspicious for thoracic malignancy. No definite residual wall thickening or hypermetabolic activity within the proximal right lower lobe bronchus. Correlate with prior bronchoscopy results and consider follow-up chest CT in 3-6 months, as clinically warranted. 2. No suspicious metabolic activity identified within the neck, abdomen or pelvis. 3. Healing fracture of the right 4th rib anteriorly with low level metabolic activity. 4. Hepatic steatosis and colonic diverticulosis.  COGNITION: Overall cognitive status:  Within functional limits for tasks assessed   SENSATION: Hx of diabetic neuropathy in bil ft and ankles. Medial worse than lateral    COORDINATION: Grossly WFL, but mild foot drop on the RLE.   EDEMA:  Mild bil distal edema.   MUSCLE TONE: WFL  MUSCLE LENGTH:   POSTURE: forward head and posterior pelvic tilt  LOWER EXTREMITY ROM:     Grossly WFL   LOWER EXTREMITY MMT:    MMT Right Eval Left Eval  Hip flexion  4 4-  Hip extension    Hip abduction 4+ 4+  Hip adduction 4+ 4  Hip internal rotation    Hip external rotation    Knee flexion 4+ 4-  Knee extension 5 4-  Ankle dorsiflexion 4 4-  Ankle plantarflexion    Ankle inversion    Ankle eversion    (Blank rows = not tested)  TRANSFERS: Assistive device utilized: None  Sit to stand: Modified independence Stand to sit: Complete Independence and Modified independence Chair to chair: Modified independence Floor:  to be assessed. Required assist with recovery from fall > 6 months ago   RAMP:  Level of Assistance: Modified independence Assistive device utilized: Single point cane Ramp Comments:  CURB:  Level of Assistance: Modified independence Assistive device utilized:  rail  Curb Comments:   STAIRS: Level of Assistance: Modified independence Stair Negotiation Technique: Step to Pattern Alternating Pattern  with Bilateral Rails Number of Stairs: 4  Height of Stairs: 6  Comments: step to ascent, step through descent   GAIT: Gait pattern:  excessive hip ER and ankle eversion on the RLE, decreased ankle dorsiflexion- Left, Right foot flat, Left foot flat, lateral hip instability, wide BOS, and poor foot clearance- Left Distance walked: 60 Assistive device utilized: Single point cane and None Level of assistance: Modified independence Comments: noted to have lateral hip instability with increased time in standing   FUNCTIONAL TESTS:  5 times sit to stand: 15.5 sec  Timed up and go (TUG): 21.54 6 minute  walk test: TBD 10 meter walk test: 15.2sec  Berg Balance Scale: TBD   PATIENT SURVEYS:  ABC scale 77.5%                                                                                                                              TREATMENT DATE: 05/18/23  TherAct:  Gait around rehab gym without AD, 20' x2 total bouts  Sitting  Resisted ankle inversion/eversion with RTB, 2x10 each direction each LE Resisted hamstring curls, GTB, 2x10 Resisted hip abduction with GTB, 2x10  Standing  Standing hip extension with UE support, 2x10 each LE Standing hip abduction with UE support, 2x10 each LE Standing marches with UE support, 2x10 each LE Standing heel raises, 2x10  STS x10   PATIENT EDUCATION: Education details: POC. Benefits of dynamic balance training.  Person educated: Patient Education method: Explanation Education comprehension: verbalized understanding  HOME EXERCISE PROGRAM: Access Code: [email or web address] URL: [email or web address] Date: 04/30/2023 Prepared by: Thresa Ross  Exercises - Small Range Straight Leg Raise  - 1 x daily - 7 x weekly - 3 sets - 10 reps - Supine Bridge  - 3-4 x weekly - 3 sets - 10 reps - Seated Long Arc Quad  - 2-3 x daily - 7 x weekly - 3 sets - 10 reps - Seated Ankle Pumps  - 1 x daily - 7 x weekly - 3 sets - 10 reps - Seated March  -  1 x daily - 7 x weekly - 3 sets - 10 reps - Standing March with Counter Support  - 1 x daily - 7 x weekly - 3 sets - 8 reps - Sit to Stand with Counter Support  - 1 x daily - 7 x weekly - 3 sets - 5 reps   GOALS: Goals reviewed with patient? Yes   SHORT TERM GOALS: Target date: 05/26/2023  Patient will be independent in home exercise program to improve strength/mobility for better functional independence with ADLs. Baseline: to be given  Goal status: INITIAL   LONG TERM GOALS: Target date: 07/21/2023  Patient will increase ABC score by equal to or greater than  8%   to demonstrate  statistically significant improvement in mobility and quality of life.  Baseline: 77.5 Goal status: INITIAL  2.  Patient (> 18 years old) will complete five times sit to stand test in < 15 seconds indicating an increased LE strength and improved balance. Baseline:15.5sec Goal status: INITIAL  3.  Patient will increase Berg Balance score by > 6 points to demonstrate decreased fall risk during functional activities Baseline: 41 Goal status: INITIAL  4.  Patient will increase 10 meter walk test to >1.81m/s as to improve gait speed for better community ambulation and to reduce fall risk. Baseline: 0.89m/s(15.2 sec ) Goal status: INITIAL  5.  Patient will reduce timed up and go to <11 seconds to reduce fall risk and demonstrate improved transfer/gait ability. Baseline: 21.54sec Goal status: INITIAL    ASSESSMENT:  CLINICAL IMPRESSION:  Pt somewhat limited due to the pain he was experiencing in the L hip during session today.  Pt still awaiting thoracic surgeon consultation prior to having any other surgery.  Pt put forth good effort throughout the session, however very verbose and limited session as noted.  Pt would likely still best be served to perform supine exercises in order to target hips with proper form.   Pt will continue to benefit from skilled therapy to address remaining deficits in order to improve overall QoL and return to PLOF.      OBJECTIVE IMPAIRMENTS: Abnormal gait, decreased activity tolerance, decreased balance, decreased endurance, decreased knowledge of condition, decreased knowledge of use of DME, decreased mobility, difficulty walking, decreased ROM, decreased strength, impaired perceived functional ability, impaired flexibility, impaired sensation, impaired UE functional use, and improper body mechanics.   ACTIVITY LIMITATIONS: stairs, transfers, and locomotion level  PARTICIPATION LIMITATIONS: cleaning, laundry, community activity, and yard work  PERSONAL  FACTORS: 1-2 comorbidities: moderately severe arthropathy   are also affecting patient's functional outcome.   REHAB POTENTIAL: Good  CLINICAL DECISION MAKING: Stable/uncomplicated  EVALUATION COMPLEXITY: Moderate  PLAN:  PT FREQUENCY: 1-2x/week  PT DURATION: 12 weeks  PLANNED INTERVENTIONS: 97110-Therapeutic exercises, 97530- Therapeutic activity, 97112- Neuromuscular re-education, 97535- Self Care, 95638- Manual therapy, [phone]- Gait training, [phone]- Orthotic Fit/training, [phone]- Aquatic Therapy, [phone]- Splinting, Balance training, Stair training, Dry Needling, Joint mobilization, Joint manipulation, Compression bandaging, DME instructions, Cryotherapy, and Moist heat  PLAN FOR NEXT SESSION:   Continues BLE strength, dynamic balance   Nolon Bussing, PT, DPT Physical Therapist - [facility]  [facility]  05/18/23, 2:46 PM

## 2023-05-21 ENCOUNTER — Ambulatory Visit: Payer: Medicare Other | Admitting: Physical Therapy

## 2023-05-25 ENCOUNTER — Ambulatory Visit: Payer: Medicare Other | Admitting: Physical Therapy

## 2023-05-26 ENCOUNTER — Ambulatory Visit: Payer: Medicare Other | Admitting: Physical Therapy

## 2023-05-26 DIAGNOSIS — R2681 Unsteadiness on feet: Secondary | ICD-10-CM

## 2023-05-26 DIAGNOSIS — R269 Unspecified abnormalities of gait and mobility: Secondary | ICD-10-CM

## 2023-05-26 DIAGNOSIS — M6281 Muscle weakness (generalized): Secondary | ICD-10-CM

## 2023-05-26 NOTE — Therapy (Signed)
 OUTPATIENT PHYSICAL THERAPY NEURO TREATMENT   Patient Name: Todd Farley MRN: [id number] DOB:October 15, 1945, 78 y.o., male Today's Date: 05/26/2023   PCP: Gracelyn Nurse, MD  REFERRING PROVIDER: Lonell Face, MD   END OF SESSION:  PT End of Session - 05/26/23 0851     Visit Number 8    Number of Visits 24    Date for PT Re-Evaluation 07/20/23    Progress Note Due on Visit 10    PT Start Time 0850    PT Stop Time 0930    PT Time Calculation (min) 40 min    Equipment Utilized During Treatment Gait belt    Activity Tolerance Patient tolerated treatment well    Behavior During Therapy WFL for tasks assessed/performed               Past Medical History:  Diagnosis Date   Actinic keratosis    Anemia    Arthritis    Bronchial stenosis, right    Cancer (HCC)    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   Diabetes mellitus without complication (HCC)    GERD (gastroesophageal reflux disease)    High cholesterol    History of kidney stones    Hypertension    Neuropathy    Umbilical hernia    Past Surgical History:  Procedure Laterality Date   BRONCHIAL WASHINGS N/A 01/16/2023   Procedure: BRONCHIAL WASHINGS;  Surgeon: [name], MD;  Location: [facility] ORS;  Service: Thoracic;  Laterality: N/A;   CARPAL TUNNEL RELEASE Right    COLONOSCOPY WITH PROPOFOL N/A 06/25/2015   Procedure: COLONOSCOPY WITH PROPOFOL;  Surgeon: Scot Jun, MD;  Location: [facility] ENDOSCOPY;  Service: Endoscopy;  Laterality: N/A;   ESOPHAGOGASTRODUODENOSCOPY (EGD) WITH PROPOFOL N/A 06/25/2015   Procedure: ESOPHAGOGASTRODUODENOSCOPY (EGD) WITH PROPOFOL;  Surgeon: Scot Jun, MD;  Location: [facility] ENDOSCOPY;  Service: Endoscopy;  Laterality: N/A;   EYE SURGERY     eyelids x2   EYE SURGERY     cataract x 2 - intraocular implants   FLEXIBLE BRONCHOSCOPY N/A 01/16/2023   Procedure: FLEXIBLE BRONCHOSCOPY;  Surgeon: [name], MD;  Location: [facility] ORS;  Service: Thoracic;  Laterality: N/A;   HERNIA  REPAIR     JOINT REPLACEMENT Right    knee   KNEE ARTHROSCOPY Right    acl repair   Patient Active Problem List   Diagnosis Date Noted   Umbilical hernia without obstruction and without gangrene 01/26/2018   Diabetic polyneuropathy associated with type 2 diabetes mellitus (HCC) 10/13/2017   GERD (gastroesophageal reflux disease) 04/13/2015   Diabetes mellitus type 2, uncomplicated (HCC) 03/14/2014   Hypertension 03/14/2014    ONSET DATE: >1 year  REFERRING DIAG:  Diagnosis  R26.89 (ICD-10-CM) - Imbalance    THERAPY DIAG:  Abnormality of gait and mobility  Unsteadiness on feet  Muscle weakness (generalized)  Rationale for Evaluation and Treatment: Rehabilitation  SUBJECTIVE:  SUBJECTIVE STATEMENT:  Pt reports he is doing well this morning. Was required to take 2 tylenol and 2 ibuprofen. Minimal pain this morning. States that he has been intentional about increasing step length with community ambulation. Will have cardiothoracic follow-up on 3/12 per pt.    From eval:  Reports that he has profound neuropathy in bilateral LE. Has had 2 falls in the last year. One when taking garbage can and and then while at the [employer]. Mild-moderate/ head trauma when each fall. Last fall was in the summer 2024. Recently diagnosed "moderately severe arthropathy" in the left hip.  Pain in standing and mobility in the L hip.   Pt accompanied by: self  PERTINENT HISTORY:  Recent MD appointment for hip pain.   "Left hip pain. He has known osteoarthritis. Is been getting worse. He did have PET scan done just recently which had an incidental finding of moderately severe arthropathy in the left hip. Have made referral to orthopedics for further workup and treatment. " From Neurology:  "Patient states  his neuropathy symptoms are worse. Increased numbness in his feet. At times is he is not paying attention he does not known where his feet are. His balance is worse. He has had two falls since his last visit. Using a cane to ambulate now. He was seen at [facility] Room on 09/09/2022 after a fall. This fall was mechanical. He was wheeling his garbage cans to the curb early in the morning on minimal sleep. Prior to this, had a mechanical fall over a terrain change. Has difficulty with prolonged standing secondary to worsening imbalance and pain (left > right). Denies fasciculations, cramping, myalgia, foot drop. "  PAIN:  Are you having pain? Yes: NPRS scale: 5/10  Pain location: bil feel and L hip  Pain description: ache in bil feet. Sharp in the hip  Aggravating factors: weight bearing  Relieving factors: rest.   PRECAUTIONS: Fall  RED FLAGS: None   WEIGHT BEARING RESTRICTIONS: No  FALLS: Has patient fallen in last 6 months? No and multiple near falls   LIVING ENVIRONMENT: Lives with: lives with their spouse Lives in: House/apartment Stairs: Yes: Internal: 15 steps; on left going up and External: 3 steps; on right going up, on left going up, and can reach both Has following equipment at home: Single point cane  PLOF: Independent and Independent with basic ADLs has been using [place] for roughly the last year.   PATIENT GOALS: improve balance, strength, endurance   OBJECTIVE:  Note: Objective measures were completed at Evaluation unless otherwise noted.  DIAGNOSTIC FINDINGS:  EXAM: NUCLEAR MEDICINE PET SKULL BASE TO THIGH IMPRESSION: 1. No findings suspicious for thoracic malignancy. No definite residual wall thickening or hypermetabolic activity within the proximal right lower lobe bronchus. Correlate with prior bronchoscopy results and consider follow-up chest CT in 3-6 months, as clinically warranted. 2. No suspicious metabolic activity identified  within the neck, abdomen or pelvis. 3. Healing fracture of the right 4th rib anteriorly with low level metabolic activity. 4. Hepatic steatosis and colonic diverticulosis.  COGNITION: Overall cognitive status: Within functional limits for tasks assessed   SENSATION: Hx of diabetic neuropathy in bil ft and ankles. Medial worse than lateral    COORDINATION: Grossly WFL, but mild foot drop on the RLE.   EDEMA:  Mild bil distal edema.   MUSCLE TONE: WFL  MUSCLE LENGTH:   POSTURE: forward head and posterior pelvic tilt  LOWER EXTREMITY ROM:  Grossly WFL   LOWER EXTREMITY MMT:    MMT Right Eval Left Eval  Hip flexion 4 4-  Hip extension    Hip abduction 4+ 4+  Hip adduction 4+ 4  Hip internal rotation    Hip external rotation    Knee flexion 4+ 4-  Knee extension 5 4-  Ankle dorsiflexion 4 4-  Ankle plantarflexion    Ankle inversion    Ankle eversion    (Blank rows = not tested)  TRANSFERS: Assistive device utilized: None  Sit to stand: Modified independence Stand to sit: Complete Independence and Modified independence Chair to chair: Modified independence Floor:  to be assessed. Required assist with recovery from fall > 6 months ago   RAMP:  Level of Assistance: Modified independence Assistive device utilized: Single point cane Ramp Comments:  CURB:  Level of Assistance: Modified independence Assistive device utilized:  rail  Curb Comments:   STAIRS: Level of Assistance: Modified independence Stair Negotiation Technique: Step to Pattern Alternating Pattern  with Bilateral Rails Number of Stairs: 4  Height of Stairs: 6  Comments: step to ascent, step through descent   GAIT: Gait pattern:  excessive hip ER and ankle eversion on the RLE, decreased ankle dorsiflexion- Left, Right foot flat, Left foot flat, lateral hip instability, wide BOS, and poor foot clearance- Left Distance walked: 60 Assistive device utilized: Single point cane and  None Level of assistance: Modified independence Comments: noted to have lateral hip instability with increased time in standing   FUNCTIONAL TESTS:  5 times sit to stand: 15.5 sec  Timed up and go (TUG): 21.54 6 minute walk test: TBD 10 meter walk test: 15.2sec  Berg Balance Scale: TBD   PATIENT SURVEYS:  ABC scale 77.5%                                                                                                                              TREATMENT DATE: 05/26/23  TE   Sitting circuit training: LAQ 3# 2 x 15  Reciprocal march 2 x 15  Resisted hamstring curls, RTB, 2x15 Resisted hip abduction with GTB, 2x15 Sit<>stand 2 x 10 without UE support   Weighted Gait with 3 # AW 2 x 168ft cues for posture and step length on BLE.   In parallel bars:  reciprocal step over half bolster, x 10  with 3#AW,  Lateral step over bolster x 10 bil 3 # AW  Cues for posture and weight shift in parallel bars to improve symmetry of gluteal activation.     PATIENT EDUCATION: Education details: POC. Benefits of dynamic balance training.  Person educated: Patient Education method: Explanation Education comprehension: verbalized understanding  HOME EXERCISE PROGRAM: Access Code: [email or web address] URL: [email or web address] Date: 04/30/2023 Prepared by: Thresa Ross  Exercises - Small Range Straight Leg Raise  - 1 x daily - 7 x weekly - 3 sets - 10 reps - Supine Bridge  - 3-4 x weekly - 3 sets -  10 reps - Seated Long Arc Quad  - 2-3 x daily - 7 x weekly - 3 sets - 10 reps - Seated Ankle Pumps  - 1 x daily - 7 x weekly - 3 sets - 10 reps - Seated March  - 1 x daily - 7 x weekly - 3 sets - 10 reps - Standing March with Counter Support  - 1 x daily - 7 x weekly - 3 sets - 8 reps - Sit to Stand with Counter Support  - 1 x daily - 7 x weekly - 3 sets - 5 reps   GOALS: Goals reviewed with patient? Yes   SHORT TERM GOALS: Target date: 05/26/2023  Patient will be independent in  home exercise program to improve strength/mobility for better functional independence with ADLs. Baseline: to be given  Goal status: INITIAL   LONG TERM GOALS: Target date: 07/21/2023  Patient will increase ABC score by equal to or greater than  8%   to demonstrate statistically significant improvement in mobility and quality of life.  Baseline: 77.5 Goal status: INITIAL  2.  Patient (> 49 years old) will complete five times sit to stand test in < 15 seconds indicating an increased LE strength and improved balance. Baseline:15.5sec Goal status: INITIAL  3.  Patient will increase Berg Balance score by > 6 points to demonstrate decreased fall risk during functional activities Baseline: 41 Goal status: INITIAL  4.  Patient will increase 10 meter walk test to >1.55m/s as to improve gait speed for better community ambulation and to reduce fall risk. Baseline: 0.73m/s(15.2 sec ) Goal status: INITIAL  5.  Patient will reduce timed up and go to <11 seconds to reduce fall risk and demonstrate improved transfer/gait ability. Baseline: 21.54sec Goal status: INITIAL    ASSESSMENT:  CLINICAL IMPRESSION:  Pt reports to PT motivated to participate. No reports of increased pain throughout session on this day. Treatment focused on improved BLE strengthening in unweighted position. Tolerated weighted gait for moderate distance well on this day. Will have cardiothoracic follow-up on 3/12 to clear pt for hip surgery/   Pt will continue to benefit from skilled therapy to address remaining deficits in order to improve overall QoL and return to PLOF.      OBJECTIVE IMPAIRMENTS: Abnormal gait, decreased activity tolerance, decreased balance, decreased endurance, decreased knowledge of condition, decreased knowledge of use of DME, decreased mobility, difficulty walking, decreased ROM, decreased strength, impaired perceived functional ability, impaired flexibility, impaired sensation, impaired UE functional  use, and improper body mechanics.   ACTIVITY LIMITATIONS: stairs, transfers, and locomotion level  PARTICIPATION LIMITATIONS: cleaning, laundry, community activity, and yard work  PERSONAL FACTORS: 1-2 comorbidities: moderately severe arthropathy   are also affecting patient's functional outcome.   REHAB POTENTIAL: Good  CLINICAL DECISION MAKING: Stable/uncomplicated  EVALUATION COMPLEXITY: Moderate  PLAN:  PT FREQUENCY: 1-2x/week  PT DURATION: 12 weeks  PLANNED INTERVENTIONS: 97110-Therapeutic exercises, 97530- Therapeutic activity, 97112- Neuromuscular re-education, 97535- Self Care, [id number]- Manual therapy, [phone]- Gait training, [phone]- Orthotic Fit/training, [phone]- Aquatic Therapy, [phone]- Splinting, Balance training, Stair training, Dry Needling, Joint mobilization, Joint manipulation, Compression bandaging, DME instructions, Cryotherapy, and Moist heat  PLAN FOR NEXT SESSION:   Continues BLE strength, dynamic balance Bed level HEP   Grier Rocher PT, DPT  Physical Therapist - [place]  [facility]  10:30 AM 05/26/23

## 2023-05-28 ENCOUNTER — Ambulatory Visit: Payer: Medicare Other | Admitting: Physical Therapy

## 2023-05-28 DIAGNOSIS — R2681 Unsteadiness on feet: Secondary | ICD-10-CM

## 2023-05-28 DIAGNOSIS — M6281 Muscle weakness (generalized): Secondary | ICD-10-CM

## 2023-05-28 DIAGNOSIS — R269 Unspecified abnormalities of gait and mobility: Secondary | ICD-10-CM | POA: Diagnosis not present

## 2023-05-28 NOTE — Therapy (Signed)
 OUTPATIENT PHYSICAL THERAPY NEURO TREATMENT   Patient Name: Todd Farley MRN: [id number] DOB:09/08/1945, 78 y.o., male Today's Date: 05/28/2023   PCP: Gracelyn Nurse, MD  REFERRING PROVIDER: Lonell Face, MD   END OF SESSION:  PT End of Session - 05/28/23 1637     Visit Number 9    Number of Visits 24    Date for PT Re-Evaluation 07/20/23    Progress Note Due on Visit 10    PT Start Time 1446    PT Stop Time 1528    PT Time Calculation (min) 42 min    Equipment Utilized During Treatment Gait belt    Activity Tolerance Patient tolerated treatment well    Behavior During Therapy WFL for tasks assessed/performed                Past Medical History:  Diagnosis Date   Actinic keratosis    Anemia    Arthritis    Bronchial stenosis, right    Cancer (HCC)    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   Diabetes mellitus without complication (HCC)    GERD (gastroesophageal reflux disease)    High cholesterol    History of kidney stones    Hypertension    Neuropathy    Umbilical hernia    Past Surgical History:  Procedure Laterality Date   BRONCHIAL WASHINGS N/A 01/16/2023   Procedure: BRONCHIAL WASHINGS;  Surgeon: [name], MD;  Location: [facility] ORS;  Service: Thoracic;  Laterality: N/A;   CARPAL TUNNEL RELEASE Right    COLONOSCOPY WITH PROPOFOL N/A 06/25/2015   Procedure: COLONOSCOPY WITH PROPOFOL;  Surgeon: Scot Jun, MD;  Location: [facility] ENDOSCOPY;  Service: Endoscopy;  Laterality: N/A;   ESOPHAGOGASTRODUODENOSCOPY (EGD) WITH PROPOFOL N/A 06/25/2015   Procedure: ESOPHAGOGASTRODUODENOSCOPY (EGD) WITH PROPOFOL;  Surgeon: Scot Jun, MD;  Location: [facility] ENDOSCOPY;  Service: Endoscopy;  Laterality: N/A;   EYE SURGERY     eyelids x2   EYE SURGERY     cataract x 2 - intraocular implants   FLEXIBLE BRONCHOSCOPY N/A 01/16/2023   Procedure: FLEXIBLE BRONCHOSCOPY;  Surgeon: [name], MD;  Location: [facility] ORS;  Service: Thoracic;  Laterality: N/A;   HERNIA  REPAIR     JOINT REPLACEMENT Right    knee   KNEE ARTHROSCOPY Right    acl repair   Patient Active Problem List   Diagnosis Date Noted   Umbilical hernia without obstruction and without gangrene 01/26/2018   Diabetic polyneuropathy associated with type 2 diabetes mellitus (HCC) 10/13/2017   GERD (gastroesophageal reflux disease) 04/13/2015   Diabetes mellitus type 2, uncomplicated (HCC) 03/14/2014   Hypertension 03/14/2014    ONSET DATE: >1 year  REFERRING DIAG:  Diagnosis  R26.89 (ICD-10-CM) - Imbalance    THERAPY DIAG:  Abnormality of gait and mobility  Unsteadiness on feet  Muscle weakness (generalized)  Rationale for Evaluation and Treatment: Rehabilitation  SUBJECTIVE:  SUBJECTIVE STATEMENT:  Pt reports he is doing well this morning. Pt reports getting some art projects done and has had difficulty with LLE functional strength in closed chain activities.    From eval:  Reports that he has profound neuropathy in bilateral LE. Has had 2 falls in the last year. One when taking garbage can and and then while at the [employer]. Mild-moderate/ head trauma when each fall. Last fall was in the summer 2024. Recently diagnosed "moderately severe arthropathy" in the left hip.  Pain in standing and mobility in the L hip.   Pt accompanied by: self  PERTINENT HISTORY:  Recent MD appointment for hip pain.   "Left hip pain. He has known osteoarthritis. Is been getting worse. He did have PET scan done just recently which had an incidental finding of moderately severe arthropathy in the left hip. Have made referral to orthopedics for further workup and treatment. " From Neurology:  "Patient states his neuropathy symptoms are worse. Increased numbness in his feet. At times is he is not paying  attention he does not known where his feet are. His balance is worse. He has had two falls since his last visit. Using a cane to ambulate now. He was seen at [facility] on 09/09/2022 after a fall. This fall was mechanical. He was wheeling his garbage cans to the curb early in the morning on minimal sleep. Prior to this, had a mechanical fall over a terrain change. Has difficulty with prolonged standing secondary to worsening imbalance and pain (left > right). Denies fasciculations, cramping, myalgia, foot drop. "  PAIN:  Are you having pain? Yes: NPRS scale: 5/10  Pain location: bil feel and L hip  Pain description: ache in bil feet. Sharp in the hip  Aggravating factors: weight bearing  Relieving factors: rest.   PRECAUTIONS: Fall  RED FLAGS: None   WEIGHT BEARING RESTRICTIONS: No  FALLS: Has patient fallen in last 6 months? No and multiple near falls   LIVING ENVIRONMENT: Lives with: lives with their spouse Lives in: House/apartment Stairs: Yes: Internal: 15 steps; on left going up and External: 3 steps; on right going up, on left going up, and can reach both Has following equipment at home: Single point cane  PLOF: Independent and Independent with basic ADLs has been using [facility] for roughly the last year.   PATIENT GOALS: improve balance, strength, endurance   OBJECTIVE:  Note: Objective measures were completed at Evaluation unless otherwise noted.  DIAGNOSTIC FINDINGS:  EXAM: NUCLEAR MEDICINE PET SKULL BASE TO THIGH IMPRESSION: 1. No findings suspicious for thoracic malignancy. No definite residual wall thickening or hypermetabolic activity within the proximal right lower lobe bronchus. Correlate with prior bronchoscopy results and consider follow-up chest CT in 3-6 months, as clinically warranted. 2. No suspicious metabolic activity identified within the neck, abdomen or pelvis. 3. Healing fracture of the right 4th rib anteriorly with  low level metabolic activity. 4. Hepatic steatosis and colonic diverticulosis.  COGNITION: Overall cognitive status: Within functional limits for tasks assessed   SENSATION: Hx of diabetic neuropathy in bil ft and ankles. Medial worse than lateral    COORDINATION: Grossly WFL, but mild foot drop on the RLE.   EDEMA:  Mild bil distal edema.   MUSCLE TONE: WFL  MUSCLE LENGTH:   POSTURE: forward head and posterior pelvic tilt  LOWER EXTREMITY ROM:     Grossly WFL   LOWER EXTREMITY MMT:    MMT Right Eval Left  Eval  Hip flexion 4 4-  Hip extension    Hip abduction 4+ 4+  Hip adduction 4+ 4  Hip internal rotation    Hip external rotation    Knee flexion 4+ 4-  Knee extension 5 4-  Ankle dorsiflexion 4 4-  Ankle plantarflexion    Ankle inversion    Ankle eversion    (Blank rows = not tested)  TRANSFERS: Assistive device utilized: None  Sit to stand: Modified independence Stand to sit: Complete Independence and Modified independence Chair to chair: Modified independence Floor:  to be assessed. Required assist with recovery from fall > 6 months ago   RAMP:  Level of Assistance: Modified independence Assistive device utilized: Single point cane Ramp Comments:  CURB:  Level of Assistance: Modified independence Assistive device utilized:  rail  Curb Comments:   STAIRS: Level of Assistance: Modified independence Stair Negotiation Technique: Step to Pattern Alternating Pattern  with Bilateral Rails Number of Stairs: 4  Height of Stairs: 6  Comments: step to ascent, step through descent   GAIT: Gait pattern:  excessive hip ER and ankle eversion on the RLE, decreased ankle dorsiflexion- Left, Right foot flat, Left foot flat, lateral hip instability, wide BOS, and poor foot clearance- Left Distance walked: 60 Assistive device utilized: Single point cane and None Level of assistance: Modified independence Comments: noted to have lateral hip instability with  increased time in standing   FUNCTIONAL TESTS:  5 times sit to stand: 15.5 sec  Timed up and go (TUG): 21.54 6 minute walk test: TBD 10 meter walk test: 15.2sec  Berg Balance Scale: TBD   PATIENT SURVEYS:  ABC scale 77.5%                                                                                                                              TREATMENT DATE: 05/28/23  TE  Nustep level 3 x 6 min  LAQ 3# 3 x 15  Reciprocal march 23 x 15    TA Side step up to step trainer x 10 ea side, some pain with L side step up. [place] support throughout  Step up to 6 in step with UE support 2 x 10 ea Weighted Gait with 3 # AW  x 159ft cues for posture and step length on BLE. Left foot colapse throughout Sit<>stand 2 x 11 without UE support      PATIENT EDUCATION: Education details: POC. Benefits of dynamic balance training.  Person educated: Patient Education method: Explanation Education comprehension: verbalized understanding  HOME EXERCISE PROGRAM: Access Code: [email or web address] URL: [email or web address] Date: 04/30/2023 Prepared by: Thresa Ross  Exercises - Small Range Straight Leg Raise  - 1 x daily - 7 x weekly - 3 sets - 10 reps - Supine Bridge  - 3-4 x weekly - 3 sets - 10 reps - Seated Long Arc Quad  - 2-3 x daily - 7 x weekly - 3 sets - 10 reps - Seated  Ankle Pumps  - 1 x daily - 7 x weekly - 3 sets - 10 reps - Seated March  - 1 x daily - 7 x weekly - 3 sets - 10 reps - Standing March with Counter Support  - 1 x daily - 7 x weekly - 3 sets - 8 reps - Sit to Stand with Counter Support  - 1 x daily - 7 x weekly - 3 sets - 5 reps   GOALS: Goals reviewed with patient? Yes   SHORT TERM GOALS: Target date: 05/26/2023  Patient will be independent in home exercise program to improve strength/mobility for better functional independence with ADLs. Baseline: to be given  Goal status: INITIAL   LONG TERM GOALS: Target date: 07/21/2023  Patient will increase ABC  score by equal to or greater than  8%   to demonstrate statistically significant improvement in mobility and quality of life.  Baseline: 77.5 Goal status: INITIAL  2.  Patient (> 76 years old) will complete five times sit to stand test in < 15 seconds indicating an increased LE strength and improved balance. Baseline:15.5sec Goal status: INITIAL  3.  Patient will increase Berg Balance score by > 6 points to demonstrate decreased fall risk during functional activities Baseline: 41 Goal status: INITIAL  4.  Patient will increase 10 meter walk test to >1.55m/s as to improve gait speed for better community ambulation and to reduce fall risk. Baseline: 0.69m/s(15.2 sec ) Goal status: INITIAL  5.  Patient will reduce timed up and go to <11 seconds to reduce fall risk and demonstrate improved transfer/gait ability. Baseline: 21.54sec Goal status: INITIAL    ASSESSMENT:  CLINICAL IMPRESSION:  Pt reports to PT motivated to participate. No reports of increased pain throughout session on this day. Treatment focused on improved BLE strengthening in some closed chain activities with monitoring of pain. Tolerated weighted gait for moderate distance well on this day. Pt will continue to benefit from skilled therapy to address remaining deficits in order to improve overall QoL and return to PLOF.      OBJECTIVE IMPAIRMENTS: Abnormal gait, decreased activity tolerance, decreased balance, decreased endurance, decreased knowledge of condition, decreased knowledge of use of DME, decreased mobility, difficulty walking, decreased ROM, decreased strength, impaired perceived functional ability, impaired flexibility, impaired sensation, impaired UE functional use, and improper body mechanics.   ACTIVITY LIMITATIONS: stairs, transfers, and locomotion level  PARTICIPATION LIMITATIONS: cleaning, laundry, community activity, and yard work  PERSONAL FACTORS: 1-2 comorbidities: moderately severe arthropathy    are also affecting patient's functional outcome.   REHAB POTENTIAL: Good  CLINICAL DECISION MAKING: Stable/uncomplicated  EVALUATION COMPLEXITY: Moderate  PLAN:  PT FREQUENCY: 1-2x/week  PT DURATION: 12 weeks  PLANNED INTERVENTIONS: 97110-Therapeutic exercises, 97530- Therapeutic activity, 97112- Neuromuscular re-education, 97535- Self Care, 16109- Manual therapy, [phone]- Gait training, [phone]- Orthotic Fit/training, [phone]- Aquatic Therapy, [phone]- Splinting, Balance training, Stair training, Dry Needling, Joint mobilization, Joint manipulation, Compression bandaging, DME instructions, Cryotherapy, and Moist heat  PLAN FOR NEXT SESSION:   Continues BLE strength, dynamic balance Bed level HEP   Norman Herrlich PT ,DPT Physical Therapist- [place]  [facility]   4:38 PM 05/28/23

## 2023-06-01 ENCOUNTER — Ambulatory Visit: Payer: Medicare Other | Attending: Neurology | Admitting: Physical Therapy

## 2023-06-01 DIAGNOSIS — R2681 Unsteadiness on feet: Secondary | ICD-10-CM | POA: Insufficient documentation

## 2023-06-01 DIAGNOSIS — R269 Unspecified abnormalities of gait and mobility: Secondary | ICD-10-CM | POA: Insufficient documentation

## 2023-06-01 DIAGNOSIS — M6281 Muscle weakness (generalized): Secondary | ICD-10-CM | POA: Diagnosis present

## 2023-06-01 NOTE — Therapy (Signed)
 OUTPATIENT PHYSICAL THERAPY NEURO TREATMENT/  PHYSICAL THERAPY PROGRESS NOTE   Dates of reporting period  04/27/23   to   06/01/2023     Patient Name: Todd Farley MRN: [id number] DOB:07/04/1945, 78 y.o., male Today's Date: 06/01/2023   PCP: Gracelyn Nurse, MD  REFERRING PROVIDER: Lonell Face, MD   END OF SESSION:  PT End of Session - 06/01/23 1509     Visit Number 10    Number of Visits 24    Date for PT Re-Evaluation 07/20/23    Progress Note Due on Visit 10    PT Start Time 1451    PT Stop Time 1530    PT Time Calculation (min) 39 min    Equipment Utilized During Treatment Gait belt    Activity Tolerance Patient tolerated treatment well    Behavior During Therapy WFL for tasks assessed/performed                Past Medical History:  Diagnosis Date   Actinic keratosis    Anemia    Arthritis    Bronchial stenosis, right    Cancer (HCC)    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   Diabetes mellitus without complication (HCC)    GERD (gastroesophageal reflux disease)    High cholesterol    History of kidney stones    Hypertension    Neuropathy    Umbilical hernia    Past Surgical History:  Procedure Laterality Date   BRONCHIAL WASHINGS N/A 01/16/2023   Procedure: BRONCHIAL WASHINGS;  Surgeon: [name], MD;  Location: [facility] ORS;  Service: Thoracic;  Laterality: N/A;   CARPAL TUNNEL RELEASE Right    COLONOSCOPY WITH PROPOFOL N/A 06/25/2015   Procedure: COLONOSCOPY WITH PROPOFOL;  Surgeon: Scot Jun, MD;  Location: [facility] ENDOSCOPY;  Service: Endoscopy;  Laterality: N/A;   ESOPHAGOGASTRODUODENOSCOPY (EGD) WITH PROPOFOL N/A 06/25/2015   Procedure: ESOPHAGOGASTRODUODENOSCOPY (EGD) WITH PROPOFOL;  Surgeon: Scot Jun, MD;  Location: [facility] ENDOSCOPY;  Service: Endoscopy;  Laterality: N/A;   EYE SURGERY     eyelids x2   EYE SURGERY     cataract x 2 - intraocular implants   FLEXIBLE BRONCHOSCOPY N/A 01/16/2023   Procedure: FLEXIBLE BRONCHOSCOPY;   Surgeon: [name], MD;  Location: [facility] ORS;  Service: Thoracic;  Laterality: N/A;   HERNIA REPAIR     JOINT REPLACEMENT Right    knee   KNEE ARTHROSCOPY Right    acl repair   Patient Active Problem List   Diagnosis Date Noted   Umbilical hernia without obstruction and without gangrene 01/26/2018   Diabetic polyneuropathy associated with type 2 diabetes mellitus (HCC) 10/13/2017   GERD (gastroesophageal reflux disease) 04/13/2015   Diabetes mellitus type 2, uncomplicated (HCC) 03/14/2014   Hypertension 03/14/2014    ONSET DATE: >1 year  REFERRING DIAG:  Diagnosis  R26.89 (ICD-10-CM) - Imbalance    THERAPY DIAG:  Abnormality of gait and mobility  Unsteadiness on feet  Muscle weakness (generalized)  Rationale for Evaluation and Treatment: Rehabilitation  SUBJECTIVE:  SUBJECTIVE STATEMENT:  Pt reports he is doing well this afternoon. Reports that over the weekend he felt a pop in the R hip and felt a significant reduction in pain immediately after.   Pt states that he feels like he is doing much better with his "gait" after the last 3-4 weeks.    From eval:  Reports that he has profound neuropathy in bilateral LE. Has had 2 falls in the last year. One when taking garbage can and and then while at the [employer]. Mild-moderate/ head trauma when each fall. Last fall was in the summer 2024. Recently diagnosed "moderately severe arthropathy" in the left hip.  Pain in standing and mobility in the L hip.   Pt accompanied by: self  PERTINENT HISTORY:  Recent MD appointment for hip pain.   "Left hip pain. He has known osteoarthritis. Is been getting worse. He did have PET scan done just recently which had an incidental finding of moderately severe arthropathy in the left hip. Have  made referral to orthopedics for further workup and treatment. " From Neurology:  "Patient states his neuropathy symptoms are worse. Increased numbness in his feet. At times is he is not paying attention he does not known where his feet are. His balance is worse. He has had two falls since his last visit. Using a cane to ambulate now. He was seen at [facility] on 09/09/2022 after a fall. This fall was mechanical. He was wheeling his garbage cans to the curb early in the morning on minimal sleep. Prior to this, had a mechanical fall over a terrain change. Has difficulty with prolonged standing secondary to worsening imbalance and pain (left > right). Denies fasciculations, cramping, myalgia, foot drop. "  PAIN:  Are you having pain? Yes: NPRS scale: 5/10  Pain location: bil feel and L hip  Pain description: ache in bil feet. Sharp in the hip  Aggravating factors: weight bearing  Relieving factors: rest.   PRECAUTIONS: Fall  RED FLAGS: None   WEIGHT BEARING RESTRICTIONS: No  FALLS: Has patient fallen in last 6 months? No and multiple near falls   LIVING ENVIRONMENT: Lives with: lives with their spouse Lives in: House/apartment Stairs: Yes: Internal: 15 steps; on left going up and External: 3 steps; on right going up, on left going up, and can reach both Has following equipment at home: Single point cane  PLOF: Independent and Independent with basic ADLs has been using [facility] for roughly the last year.   PATIENT GOALS: improve balance, strength, endurance   OBJECTIVE:  Note: Objective measures were completed at Evaluation unless otherwise noted.  DIAGNOSTIC FINDINGS:  EXAM: NUCLEAR MEDICINE PET SKULL BASE TO THIGH IMPRESSION: 1. No findings suspicious for thoracic malignancy. No definite residual wall thickening or hypermetabolic activity within the proximal right lower lobe bronchus. Correlate with prior bronchoscopy results and consider  follow-up chest CT in 3-6 months, as clinically warranted. 2. No suspicious metabolic activity identified within the neck, abdomen or pelvis. 3. Healing fracture of the right 4th rib anteriorly with low level metabolic activity. 4. Hepatic steatosis and colonic diverticulosis.  COGNITION: Overall cognitive status: Within functional limits for tasks assessed   SENSATION: Hx of diabetic neuropathy in bil ft and ankles. Medial worse than lateral    COORDINATION: Grossly WFL, but mild foot drop on the RLE.   EDEMA:  Mild bil distal edema.   MUSCLE TONE: WFL  MUSCLE LENGTH:   POSTURE: forward head and posterior  pelvic tilt  LOWER EXTREMITY ROM:     Grossly WFL   LOWER EXTREMITY MMT:    MMT Right Eval Left Eval  Hip flexion 4 4-  Hip extension    Hip abduction 4+ 4+  Hip adduction 4+ 4  Hip internal rotation    Hip external rotation    Knee flexion 4+ 4-  Knee extension 5 4-  Ankle dorsiflexion 4 4-  Ankle plantarflexion    Ankle inversion    Ankle eversion    (Blank rows = not tested)  TRANSFERS: Assistive device utilized: None  Sit to stand: Modified independence Stand to sit: Complete Independence and Modified independence Chair to chair: Modified independence Floor:  to be assessed. Required assist with recovery from fall > 6 months ago   RAMP:  Level of Assistance: Modified independence Assistive device utilized: Single point cane Ramp Comments:  CURB:  Level of Assistance: Modified independence Assistive device utilized:  rail  Curb Comments:   STAIRS: Level of Assistance: Modified independence Stair Negotiation Technique: Step to Pattern Alternating Pattern  with Bilateral Rails Number of Stairs: 4  Height of Stairs: 6  Comments: step to ascent, step through descent   GAIT: Gait pattern:  excessive hip ER and ankle eversion on the RLE, decreased ankle dorsiflexion- Left, Right foot flat, Left foot flat, lateral hip instability, wide BOS,  and poor foot clearance- Left Distance walked: 60 Assistive device utilized: Single point cane and None Level of assistance: Modified independence Comments: noted to have lateral hip instability with increased time in standing   FUNCTIONAL TESTS:  5 times sit to stand: 15.5 sec  Timed up and go (TUG): 21.54 6 minute walk test: TBD 10 meter walk test: 15.2sec  Berg Balance Scale: TBD   PATIENT SURVEYS:  ABC scale 77.5% 3/3 75%                                                                                                                             TREATMENT DATE: 06/01/23 Nustep BLE/BLE reciprocal movement and endurance training. Performed x 6 min level 1-3. Cues for full ROM as tolerated.  Reciprocal Foot tap on 4inch step x 15.  Lateral foot tap in 4 inch step x 15 lateral   PT instructed in goal assessment.   Pt performed 5 time sit<>stand (5xSTS): 14.71 sec (>15 sec indicates increased fall risk)   Patient demonstrates increased fall risk as noted by score of   46/56 on Berg Balance Scale.  (<36= high risk for falls, close to 100%; 37-45 significant >80%; 46-51 moderate >50%; 52-55 lower >25%)  10 Meter Walk Test: Patient instructed to walk 10 meters (32.8 ft) as quickly and as safely as possible at their normal speed x2 and at a fast speed x2. Time measured from 2 meter mark to 8 meter mark to accommodate ramp-up and ramp-down.  Average Normal speed: 0.15m/s  Cut off scores: <0.4 m/s = household Ambulator, 0.4-0.8 m/s =  limited community Ambulator, >0.8 m/s = community Ambulator, >1.2 m/s = crossing a street, <1.0 = increased fall risk MCID 0.05 m/s (small), 0.13 m/s (moderate), 0.06 m/s (significant)  (ANPTA Core Set of Outcome Measures for Adults with Neurologic Conditions, 2018)  PT instructed pt in TUG:  14.24 sec avg.(average of 3 trials; >13.5 sec indicates increased fall risk)  Pt completed ABC.   PATIENT EDUCATION: Education details: POC. Benefits of dynamic  balance training.  Person educated: Patient Education method: Explanation Education comprehension: verbalized understanding  HOME EXERCISE PROGRAM: Access Code: [email or web address] URL: [email or web address] Date: 04/30/2023 Prepared by: Thresa Ross  Exercises - Small Range Straight Leg Raise  - 1 x daily - 7 x weekly - 3 sets - 10 reps - Supine Bridge  - 3-4 x weekly - 3 sets - 10 reps - Seated Long Arc Quad  - 2-3 x daily - 7 x weekly - 3 sets - 10 reps - Seated Ankle Pumps  - 1 x daily - 7 x weekly - 3 sets - 10 reps - Seated March  - 1 x daily - 7 x weekly - 3 sets - 10 reps - Standing March with Counter Support  - 1 x daily - 7 x weekly - 3 sets - 8 reps - Sit to Stand with Counter Support  - 1 x daily - 7 x weekly - 3 sets - 5 reps   GOALS: Goals reviewed with patient? Yes   SHORT TERM GOALS: Target date: 05/26/2023  Patient will be independent in home exercise program to improve strength/mobility for better functional independence with ADLs. Baseline: provided 1/30 Goal status: IN PROGRESS   LONG TERM GOALS: Target date: 07/21/2023  Patient will increase ABC score by equal to or greater than  8%   to demonstrate statistically significant improvement in mobility and quality of life.  Baseline: 77.5  3/3:75 Goal status: IN PROGRESS  2.  Patient (> 23 years old) will complete five times sit to stand test in < 15 seconds indicating an increased LE strength and improved balance Baseline:15.5sec 06/01/23: 14.71 Goal status: IN PROGRESS  3.  Patient will increase Berg Balance score by > 6 points to demonstrate decreased fall risk during functional activities Baseline: 41 06/01/23: 46 Goal status: IN PROGRESS  4.  Patient will increase 10 meter walk test to >1.52m/s as to improve gait speed for better community ambulation and to reduce fall risk. Baseline: 0.51m/s(15.2 sec)  3/3: 0.3m/s  Goal status: IN PROGRESS  5.  Patient will reduce timed up and go to <11  seconds to reduce fall risk and demonstrate improved transfer/gait ability. Baseline: 21.54sec 3/3: 14.24 sec avg.  Goal status: IN PROGRESS    ASSESSMENT:  CLINICAL IMPRESSION:  Pt reports to PT motivated to participate. Instructed in goal assessment to measure progress towards LTG. Pt demonstrates improved balance with slight decrease in 5xSTS, significant decrease in TUG to 14.2 sec, improve Berg and increased gait speed. Pt was noted to have slight reduction in ABC, but verbally reports feeling more stable and secure with walking. Pt is hoping to have hip replacement in the next couple months, but will need to cleared by cardiopulmonary and thoracic surgery. Patient's condition has the potential to improve in response to therapy. Maximum improvement is yet to be obtained. The anticipated improvement is attainable and reasonable in a generally predictable time. Pt will continue to benefit from skilled therapy to address remaining deficits in order to improve overall QoL and return to PLOF.  OBJECTIVE IMPAIRMENTS: Abnormal gait, decreased activity tolerance, decreased balance, decreased endurance, decreased knowledge of condition, decreased knowledge of use of DME, decreased mobility, difficulty walking, decreased ROM, decreased strength, impaired perceived functional ability, impaired flexibility, impaired sensation, impaired UE functional use, and improper body mechanics.   ACTIVITY LIMITATIONS: stairs, transfers, and locomotion level  PARTICIPATION LIMITATIONS: cleaning, laundry, community activity, and yard work  PERSONAL FACTORS: 1-2 comorbidities: moderately severe arthropathy   are also affecting patient's functional outcome.   REHAB POTENTIAL: Good  CLINICAL DECISION MAKING: Stable/uncomplicated  EVALUATION COMPLEXITY: Moderate  PLAN:  PT FREQUENCY: 1-2x/week  PT DURATION: 12 weeks  PLANNED INTERVENTIONS: 97110-Therapeutic exercises, 97530- Therapeutic activity,  97112- Neuromuscular re-education, 97535- Self Care, 16109- Manual therapy, [phone]- Gait training, [phone]- Orthotic Fit/training, [phone]- Aquatic Therapy, [phone]- Splinting, Balance training, Stair training, Dry Needling, Joint mobilization, Joint manipulation, Compression bandaging, DME instructions, Cryotherapy, and Moist heat  PLAN FOR NEXT SESSION:   Continues BLE strength, dynamic balance Provide Bed level HEP   Golden Pop PT ,DPT Physical Therapist- [place]  [facility]   4:28 PM 06/01/23

## 2023-06-04 ENCOUNTER — Ambulatory Visit: Payer: Medicare Other | Admitting: Physical Therapy

## 2023-06-04 DIAGNOSIS — M6281 Muscle weakness (generalized): Secondary | ICD-10-CM

## 2023-06-04 DIAGNOSIS — R269 Unspecified abnormalities of gait and mobility: Secondary | ICD-10-CM

## 2023-06-04 DIAGNOSIS — R2681 Unsteadiness on feet: Secondary | ICD-10-CM

## 2023-06-04 NOTE — Therapy (Signed)
 OUTPATIENT PHYSICAL THERAPY NEURO TREATMENT    Patient Name: MAXIMO SPRATLING MRN: [id number] DOB:05-03-1945, 78 y.o., male Today's Date: 06/04/2023   PCP: Gracelyn Nurse, MD  REFERRING PROVIDER: Lonell Face, MD   END OF SESSION:  PT End of Session - 06/04/23 1442     Visit Number 11    Number of Visits 24    Date for PT Re-Evaluation 07/20/23    Progress Note Due on Visit 20    PT Start Time 1445    PT Stop Time 1529    PT Time Calculation (min) 44 min    Equipment Utilized During Treatment Gait belt    Activity Tolerance Patient tolerated treatment well    Behavior During Therapy WFL for tasks assessed/performed                Past Medical History:  Diagnosis Date   Actinic keratosis    Anemia    Arthritis    Bronchial stenosis, right    Cancer (HCC)    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   Diabetes mellitus without complication (HCC)    GERD (gastroesophageal reflux disease)    High cholesterol    History of kidney stones    Hypertension    Neuropathy    Umbilical hernia    Past Surgical History:  Procedure Laterality Date   BRONCHIAL WASHINGS N/A 01/16/2023   Procedure: BRONCHIAL WASHINGS;  Surgeon: [name], MD;  Location: [facility] ORS;  Service: Thoracic;  Laterality: N/A;   CARPAL TUNNEL RELEASE Right    COLONOSCOPY WITH PROPOFOL N/A 06/25/2015   Procedure: COLONOSCOPY WITH PROPOFOL;  Surgeon: Scot Jun, MD;  Location: [facility] ENDOSCOPY;  Service: Endoscopy;  Laterality: N/A;   ESOPHAGOGASTRODUODENOSCOPY (EGD) WITH PROPOFOL N/A 06/25/2015   Procedure: ESOPHAGOGASTRODUODENOSCOPY (EGD) WITH PROPOFOL;  Surgeon: Scot Jun, MD;  Location: [facility] ENDOSCOPY;  Service: Endoscopy;  Laterality: N/A;   EYE SURGERY     eyelids x2   EYE SURGERY     cataract x 2 - intraocular implants   FLEXIBLE BRONCHOSCOPY N/A 01/16/2023   Procedure: FLEXIBLE BRONCHOSCOPY;  Surgeon: [name], MD;  Location: [facility] ORS;  Service: Thoracic;  Laterality: N/A;   HERNIA  REPAIR     JOINT REPLACEMENT Right    knee   KNEE ARTHROSCOPY Right    acl repair   Patient Active Problem List   Diagnosis Date Noted   Umbilical hernia without obstruction and without gangrene 01/26/2018   Diabetic polyneuropathy associated with type 2 diabetes mellitus (HCC) 10/13/2017   GERD (gastroesophageal reflux disease) 04/13/2015   Diabetes mellitus type 2, uncomplicated (HCC) 03/14/2014   Hypertension 03/14/2014    ONSET DATE: >1 year  REFERRING DIAG:  Diagnosis  R26.89 (ICD-10-CM) - Imbalance    THERAPY DIAG:  Abnormality of gait and mobility  Unsteadiness on feet  Muscle weakness (generalized)  Rationale for Evaluation and Treatment: Rehabilitation  SUBJECTIVE:  SUBJECTIVE STATEMENT:  Patient reports increased pain this morning due to forgetting to take his pain medicine last night but over the course of the day he is now feeling better.   From eval:  Reports that he has profound neuropathy in bilateral LE. Has had 2 falls in the last year. One when taking garbage can and and then while at the [employer]. Mild-moderate/ head trauma when each fall. Last fall was in the summer 2024. Recently diagnosed "moderately severe arthropathy" in the left hip.  Pain in standing and mobility in the L hip.   Pt accompanied by: self  PERTINENT HISTORY:  Recent MD appointment for hip pain.   "Left hip pain. He has known osteoarthritis. Is been getting worse. He did have PET scan done just recently which had an incidental finding of moderately severe arthropathy in the left hip. Have made referral to orthopedics for further workup and treatment. " From Neurology:  "Patient states his neuropathy symptoms are worse. Increased numbness in his feet. At times is he is not paying attention  he does not known where his feet are. His balance is worse. He has had two falls since his last visit. Using a cane to ambulate now. He was seen at [facility] on 09/09/2022 after a fall. This fall was mechanical. He was wheeling his garbage cans to the curb early in the morning on minimal sleep. Prior to this, had a mechanical fall over a terrain change. Has difficulty with prolonged standing secondary to worsening imbalance and pain (left > right). Denies fasciculations, cramping, myalgia, foot drop. "  PAIN:  Are you having pain? Yes: NPRS scale: 5/10  Pain location: bil feel and L hip  Pain description: ache in bil feet. Sharp in the hip  Aggravating factors: weight bearing  Relieving factors: rest.   PRECAUTIONS: Fall  RED FLAGS: None   WEIGHT BEARING RESTRICTIONS: No  FALLS: Has patient fallen in last 6 months? No and multiple near falls   LIVING ENVIRONMENT: Lives with: lives with their spouse Lives in: House/apartment Stairs: Yes: Internal: 15 steps; on left going up and External: 3 steps; on right going up, on left going up, and can reach both Has following equipment at home: Single point cane  PLOF: Independent and Independent with basic ADLs has been using [facility] for roughly the last year.   PATIENT GOALS: improve balance, strength, endurance   OBJECTIVE:  Note: Objective measures were completed at Evaluation unless otherwise noted.  DIAGNOSTIC FINDINGS:  EXAM: NUCLEAR MEDICINE PET SKULL BASE TO THIGH IMPRESSION: 1. No findings suspicious for thoracic malignancy. No definite residual wall thickening or hypermetabolic activity within the proximal right lower lobe bronchus. Correlate with prior bronchoscopy results and consider follow-up chest CT in 3-6 months, as clinically warranted. 2. No suspicious metabolic activity identified within the neck, abdomen or pelvis. 3. Healing fracture of the right 4th rib anteriorly with low  level metabolic activity. 4. Hepatic steatosis and colonic diverticulosis.  COGNITION: Overall cognitive status: Within functional limits for tasks assessed   SENSATION: Hx of diabetic neuropathy in bil ft and ankles. Medial worse than lateral    COORDINATION: Grossly WFL, but mild foot drop on the RLE.   EDEMA:  Mild bil distal edema.   MUSCLE TONE: WFL  MUSCLE LENGTH:   POSTURE: forward head and posterior pelvic tilt  LOWER EXTREMITY ROM:     Grossly WFL   LOWER EXTREMITY MMT:    MMT Right Eval Left  Eval  Hip flexion 4 4-  Hip extension    Hip abduction 4+ 4+  Hip adduction 4+ 4  Hip internal rotation    Hip external rotation    Knee flexion 4+ 4-  Knee extension 5 4-  Ankle dorsiflexion 4 4-  Ankle plantarflexion    Ankle inversion    Ankle eversion    (Blank rows = not tested)  TRANSFERS: Assistive device utilized: None  Sit to stand: Modified independence Stand to sit: Complete Independence and Modified independence Chair to chair: Modified independence Floor:  to be assessed. Required assist with recovery from fall > 6 months ago   RAMP:  Level of Assistance: Modified independence Assistive device utilized: Single point cane Ramp Comments:  CURB:  Level of Assistance: Modified independence Assistive device utilized:  rail  Curb Comments:   STAIRS: Level of Assistance: Modified independence Stair Negotiation Technique: Step to Pattern Alternating Pattern  with Bilateral Rails Number of Stairs: 4  Height of Stairs: 6  Comments: step to ascent, step through descent   GAIT: Gait pattern:  excessive hip ER and ankle eversion on the RLE, decreased ankle dorsiflexion- Left, Right foot flat, Left foot flat, lateral hip instability, wide BOS, and poor foot clearance- Left Distance walked: 60 Assistive device utilized: Single point cane and None Level of assistance: Modified independence Comments: noted to have lateral hip instability with  increased time in standing   FUNCTIONAL TESTS:  5 times sit to stand: 15.5 sec  Timed up and go (TUG): 21.54 6 minute walk test: TBD 10 meter walk test: 15.2sec  Berg Balance Scale: TBD   PATIENT SURVEYS:  ABC scale 77.5% 3/3 75%                                                                                                                             TREATMENT DATE: 06/04/23 Nustep BLE/BLE reciprocal movement and endurance training. Performed x 6 min level 1-3. Cues for full ROM as tolerated.   Reciprocal Foot tap on 4inch step x 15.  Lateral foot tap in 4 inch step x 15 lateral    TE  Nustep level 3 x 6 min  LAQ 3 x 12 with 4#  Reciprocal march 2 x 15 with 4# Banded resisted hip IR RTB around ankle in seated 2 x 10   TA Sit<>stand 2 x 10 without UE support  Side step up to 6 in step 2 x 10 ea side,  UE support throughout  Step up to 6 in step with UE support 2 x 10 ea Weighted Gait with 3 # AW  x 178ft cues for posture and step length on BLE. Left foot colapse throughout     PATIENT EDUCATION: Education details: POC. Benefits of dynamic balance training.  Person educated: Patient Education method: Explanation Education comprehension: verbalized understanding  HOME EXERCISE PROGRAM: Access Code: [email or web address] URL: [email or web address] Date: 04/30/2023 Prepared by: Thresa Ross  Exercises - Small Range Straight  Leg Raise  - 1 x daily - 7 x weekly - 3 sets - 10 reps - Supine Bridge  - 3-4 x weekly - 3 sets - 10 reps - Seated Long Arc Quad  - 2-3 x daily - 7 x weekly - 3 sets - 10 reps - Seated Ankle Pumps  - 1 x daily - 7 x weekly - 3 sets - 10 reps - Seated March  - 1 x daily - 7 x weekly - 3 sets - 10 reps - Standing March with Counter Support  - 1 x daily - 7 x weekly - 3 sets - 8 reps - Sit to Stand with Counter Support  - 1 x daily - 7 x weekly - 3 sets - 5 reps   GOALS: Goals reviewed with patient? Yes   SHORT TERM GOALS: Target date:  05/26/2023  Patient will be independent in home exercise program to improve strength/mobility for better functional independence with ADLs. Baseline: provided 1/30 Goal status: IN PROGRESS   LONG TERM GOALS: Target date: 07/21/2023  Patient will increase ABC score by equal to or greater than  8%   to demonstrate statistically significant improvement in mobility and quality of life.  Baseline: 77.5  3/3:75 Goal status: IN PROGRESS  2.  Patient (> 77 years old) will complete five times sit to stand test in < 15 seconds indicating an increased LE strength and improved balance Baseline:15.5sec 06/01/23: 14.71 Goal status: IN PROGRESS  3.  Patient will increase Berg Balance score by > 6 points to demonstrate decreased fall risk during functional activities Baseline: 41 06/01/23: 46 Goal status: IN PROGRESS  4.  Patient will increase 10 meter walk test to >1.30m/s as to improve gait speed for better community ambulation and to reduce fall risk. Baseline: 0.88m/s(15.2 sec)  3/3: 0.60m/s  Goal status: IN PROGRESS  5.  Patient will reduce timed up and go to <11 seconds to reduce fall risk and demonstrate improved transfer/gait ability. Baseline: 21.54sec 3/3: 14.24 sec avg.  Goal status: IN PROGRESS    ASSESSMENT:  CLINICAL IMPRESSION:  Patient arrived with good motivation for completion of pt activities. Pt challenged with activities to improve strength. Pt has trouble with some activities secondary to hip motion limitations and foot medial collapse but still showing progress with various activities. Continues with progression of activities from previous sessions. Pt will continue to benefit from skilled physical therapy intervention to address impairments, improve QOL, and attain therapy goals.      OBJECTIVE IMPAIRMENTS: Abnormal gait, decreased activity tolerance, decreased balance, decreased endurance, decreased knowledge of condition, decreased knowledge of use of DME, decreased  mobility, difficulty walking, decreased ROM, decreased strength, impaired perceived functional ability, impaired flexibility, impaired sensation, impaired UE functional use, and improper body mechanics.   ACTIVITY LIMITATIONS: stairs, transfers, and locomotion level  PARTICIPATION LIMITATIONS: cleaning, laundry, community activity, and yard work  PERSONAL FACTORS: 1-2 comorbidities: moderately severe arthropathy   are also affecting patient's functional outcome.   REHAB POTENTIAL: Good  CLINICAL DECISION MAKING: Stable/uncomplicated  EVALUATION COMPLEXITY: Moderate  PLAN:  PT FREQUENCY: 1-2x/week  PT DURATION: 12 weeks  PLANNED INTERVENTIONS: 97110-Therapeutic exercises, 97530- Therapeutic activity, [id number]- Neuromuscular re-education, 97535- Self Care, 95621- Manual therapy, [id number]- Gait training, [phone]- Orthotic Fit/training, [phone]- Aquatic Therapy, [phone]- Splinting, Balance training, Stair training, Dry Needling, Joint mobilization, Joint manipulation, Compression bandaging, DME instructions, Cryotherapy, and Moist heat  PLAN FOR NEXT SESSION:   Continues BLE strength, dynamic balance Provide Bed level HEP  Norman Herrlich PT ,DPT Physical Therapist- [place]  [facility]   2:59 PM 06/04/23

## 2023-06-08 ENCOUNTER — Ambulatory Visit: Payer: Medicare Other | Admitting: Physical Therapy

## 2023-06-08 DIAGNOSIS — R2681 Unsteadiness on feet: Secondary | ICD-10-CM

## 2023-06-08 DIAGNOSIS — M6281 Muscle weakness (generalized): Secondary | ICD-10-CM

## 2023-06-08 DIAGNOSIS — R269 Unspecified abnormalities of gait and mobility: Secondary | ICD-10-CM | POA: Diagnosis not present

## 2023-06-08 NOTE — Therapy (Signed)
 OUTPATIENT PHYSICAL THERAPY NEURO TREATMENT    Patient Name: Todd Farley MRN: [id number] DOB:[birth year], 78 y.o., male Today's Date: 06/08/2023   PCP: Gracelyn Nurse, MD  REFERRING PROVIDER: Lonell Face, MD   END OF SESSION:  PT End of Session - 06/08/23 1502     Visit Number 12    Number of Visits 24    Date for PT Re-Evaluation 07/20/23    Progress Note Due on Visit 20    PT Start Time 1450    PT Stop Time 1530    PT Time Calculation (min) 40 min    Equipment Utilized During Treatment Gait belt    Activity Tolerance Patient tolerated treatment well    Behavior During Therapy WFL for tasks assessed/performed                Past Medical History:  Diagnosis Date   Actinic keratosis    Anemia    Arthritis    Bronchial stenosis, right    Cancer (HCC)    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   Diabetes mellitus without complication (HCC)    GERD (gastroesophageal reflux disease)    High cholesterol    History of kidney stones    Hypertension    Neuropathy    Umbilical hernia    Past Surgical History:  Procedure Laterality Date   BRONCHIAL WASHINGS N/A 01/16/2023   Procedure: BRONCHIAL WASHINGS;  Surgeon: [name], MD;  Location: [facility] ORS;  Service: Thoracic;  Laterality: N/A;   CARPAL TUNNEL RELEASE Right    COLONOSCOPY WITH PROPOFOL N/A 06/25/2015   Procedure: COLONOSCOPY WITH PROPOFOL;  Surgeon: Scot Jun, MD;  Location: [facility] ENDOSCOPY;  Service: Endoscopy;  Laterality: N/A;   ESOPHAGOGASTRODUODENOSCOPY (EGD) WITH PROPOFOL N/A 06/25/2015   Procedure: ESOPHAGOGASTRODUODENOSCOPY (EGD) WITH PROPOFOL;  Surgeon: Scot Jun, MD;  Location: [facility] ENDOSCOPY;  Service: Endoscopy;  Laterality: N/A;   EYE SURGERY     eyelids x2   EYE SURGERY     cataract x 2 - intraocular implants   FLEXIBLE BRONCHOSCOPY N/A 01/16/2023   Procedure: FLEXIBLE BRONCHOSCOPY;  Surgeon: [name], MD;  Location: [facility] ORS;  Service: Thoracic;  Laterality: N/A;   HERNIA  REPAIR     JOINT REPLACEMENT Right    knee   KNEE ARTHROSCOPY Right    acl repair   Patient Active Problem List   Diagnosis Date Noted   Umbilical hernia without obstruction and without gangrene 01/26/2018   Diabetic polyneuropathy associated with type 2 diabetes mellitus (HCC) 10/13/2017   GERD (gastroesophageal reflux disease) 04/13/2015   Diabetes mellitus type 2, uncomplicated (HCC) 03/14/2014   Hypertension 03/14/2014    ONSET DATE: >1 year  REFERRING DIAG:  Diagnosis  R26.89 (ICD-10-CM) - Imbalance    THERAPY DIAG:  Abnormality of gait and mobility  Unsteadiness on feet  Muscle weakness (generalized)  Rationale for Evaluation and Treatment: Rehabilitation  SUBJECTIVE:  SUBJECTIVE STATEMENT:  Patient reports that he is doing well. Completed some yard work over the weekend. No pain reported.    From eval:  Reports that he has profound neuropathy in bilateral LE. Has had 2 falls in the last year. One when taking garbage can and and then while at the [employer]. Mild-moderate/ head trauma when each fall. Last fall was in the summer 2024. Recently diagnosed "moderately severe arthropathy" in the left hip.  Pain in standing and mobility in the L hip.   Pt accompanied by: self  PERTINENT HISTORY:  Recent MD appointment for hip pain.   "Left hip pain. He has known osteoarthritis. Is been getting worse. He did have PET scan done just recently which had an incidental finding of moderately severe arthropathy in the left hip. Have made referral to orthopedics for further workup and treatment. " From Neurology:  "Patient states his neuropathy symptoms are worse. Increased numbness in his feet. At times is he is not paying attention he does not known where his feet are. His balance is  worse. He has had two falls since his last visit. Using a cane to ambulate now. He was seen at [facility] on 09/09/2022 after a fall. This fall was mechanical. He was wheeling his garbage cans to the curb early in the morning on minimal sleep. Prior to this, had a mechanical fall over a terrain change. Has difficulty with prolonged standing secondary to worsening imbalance and pain (left > right). Denies fasciculations, cramping, myalgia, foot drop. "  PAIN:  Are you having pain? Yes: NPRS scale: 5/10  Pain location: bil feel and L hip  Pain description: ache in bil feet. Sharp in the hip  Aggravating factors: weight bearing  Relieving factors: rest.   PRECAUTIONS: Fall  RED FLAGS: None   WEIGHT BEARING RESTRICTIONS: No  FALLS: Has patient fallen in last 6 months? No and multiple near falls   LIVING ENVIRONMENT: Lives with: lives with their spouse Lives in: House/apartment Stairs: Yes: Internal: 15 steps; on left going up and External: 3 steps; on right going up, on left going up, and can reach both Has following equipment at home: Single point cane  PLOF: Independent and Independent with basic ADLs has been using [facility] for roughly the last year.   PATIENT GOALS: improve balance, strength, endurance   OBJECTIVE:  Note: Objective measures were completed at Evaluation unless otherwise noted.  DIAGNOSTIC FINDINGS:  EXAM: NUCLEAR MEDICINE PET SKULL BASE TO THIGH IMPRESSION: 1. No findings suspicious for thoracic malignancy. No definite residual wall thickening or hypermetabolic activity within the proximal right lower lobe bronchus. Correlate with prior bronchoscopy results and consider follow-up chest CT in 3-6 months, as clinically warranted. 2. No suspicious metabolic activity identified within the neck, abdomen or pelvis. 3. Healing fracture of the right 4th rib anteriorly with low level metabolic activity. 4. Hepatic steatosis and colonic  diverticulosis.  COGNITION: Overall cognitive status: Within functional limits for tasks assessed   SENSATION: Hx of diabetic neuropathy in bil ft and ankles. Medial worse than lateral    COORDINATION: Grossly WFL, but mild foot drop on the RLE.   EDEMA:  Mild bil distal edema.   MUSCLE TONE: WFL  MUSCLE LENGTH:   POSTURE: forward head and posterior pelvic tilt  LOWER EXTREMITY ROM:     Grossly WFL   LOWER EXTREMITY MMT:    MMT Right Eval Left Eval  Hip flexion 4 4-  Hip extension  Hip abduction 4+ 4+  Hip adduction 4+ 4  Hip internal rotation    Hip external rotation    Knee flexion 4+ 4-  Knee extension 5 4-  Ankle dorsiflexion 4 4-  Ankle plantarflexion    Ankle inversion    Ankle eversion    (Blank rows = not tested)  TRANSFERS: Assistive device utilized: None  Sit to stand: Modified independence Stand to sit: Complete Independence and Modified independence Chair to chair: Modified independence Floor:  to be assessed. Required assist with recovery from fall > 6 months ago   RAMP:  Level of Assistance: Modified independence Assistive device utilized: Single point cane Ramp Comments:  CURB:  Level of Assistance: Modified independence Assistive device utilized:  rail  Curb Comments:   STAIRS: Level of Assistance: Modified independence Stair Negotiation Technique: Step to Pattern Alternating Pattern  with Bilateral Rails Number of Stairs: 4  Height of Stairs: 6  Comments: step to ascent, step through descent   GAIT: Gait pattern:  excessive hip ER and ankle eversion on the RLE, decreased ankle dorsiflexion- Left, Right foot flat, Left foot flat, lateral hip instability, wide BOS, and poor foot clearance- Left Distance walked: 60 Assistive device utilized: Single point cane and None Level of assistance: Modified independence Comments: noted to have lateral hip instability with increased time in standing   FUNCTIONAL TESTS:  5 times sit to  stand: 15.5 sec  Timed up and go (TUG): 21.54 6 minute walk test: TBD 10 meter walk test: 15.2sec  Berg Balance Scale: TBD   PATIENT SURVEYS:  ABC scale 77.5% 3/3 75%                                                                                                                             TREATMENT DATE: 06/08/23   TE  Nustep level 3 x 6 min  LAQ 3 x 12 with 4#  Reciprocal march 2 x 15 with 4# Banded resisted hip ER RTB around knees 2 x 20  Ankle PF x 20 bil BTB  Isometric hip adduction 2x 20  Weighted gait 3# AW  training 3 x 362ft with SPC.  Reciprocal Foot tap on 4inch step x 15. 3# AW  Lateral foot tap in 4 inch step x 15 lateral 3# AW  Cues for full ROM throughout and increased posture and step length with gait training.    PATIENT EDUCATION: Education details: POC. Benefits of dynamic balance training.  Person educated: Patient Education method: Explanation Education comprehension: verbalized understanding  HOME EXERCISE PROGRAM: Access Code: [email or web address] URL: [email or web address] Date: 04/30/2023 Prepared by: Thresa Ross  Exercises - Small Range Straight Leg Raise  - 1 x daily - 7 x weekly - 3 sets - 10 reps - Supine Bridge  - 3-4 x weekly - 3 sets - 10 reps - Seated Long Arc Quad  - 2-3 x daily - 7 x weekly - 3 sets - 10 reps - Seated Ankle  Pumps  - 1 x daily - 7 x weekly - 3 sets - 10 reps - Seated March  - 1 x daily - 7 x weekly - 3 sets - 10 reps - Standing March with Counter Support  - 1 x daily - 7 x weekly - 3 sets - 8 reps - Sit to Stand with Counter Support  - 1 x daily - 7 x weekly - 3 sets - 5 reps   GOALS: Goals reviewed with patient? Yes   SHORT TERM GOALS: Target date: 05/26/2023  Patient will be independent in home exercise program to improve strength/mobility for better functional independence with ADLs. Baseline: provided 1/30 Goal status: IN PROGRESS   LONG TERM GOALS: Target date: 07/21/2023  Patient will increase  ABC score by equal to or greater than  8%   to demonstrate statistically significant improvement in mobility and quality of life.  Baseline: 77.5  3/3:75 Goal status: IN PROGRESS  2.  Patient (> 42 years old) will complete five times sit to stand test in < 15 seconds indicating an increased LE strength and improved balance Baseline:15.5sec 06/01/23: 14.71 Goal status: IN PROGRESS  3.  Patient will increase Berg Balance score by > 6 points to demonstrate decreased fall risk during functional activities Baseline: 41 06/01/23: 46 Goal status: IN PROGRESS  4.  Patient will increase 10 meter walk test to >1.38m/s as to improve gait speed for better community ambulation and to reduce fall risk. Baseline: 0.68m/s(15.2 sec)  3/3: 0.78m/s  Goal status: IN PROGRESS  5.  Patient will reduce timed up and go to <11 seconds to reduce fall risk and demonstrate improved transfer/gait ability. Baseline: 21.54sec 3/3: 14.24 sec avg.  Goal status: IN PROGRESS    ASSESSMENT:  CLINICAL IMPRESSION:  Patient arrived with good motivation for completion of pt activities.  PT treatment focused on improved BLE strength and step length with weighted gait training. No reports of pain on this day. Pt will continue to benefit from skilled physical therapy intervention to address impairments, improve QOL, and attain therapy goals.      OBJECTIVE IMPAIRMENTS: Abnormal gait, decreased activity tolerance, decreased balance, decreased endurance, decreased knowledge of condition, decreased knowledge of use of DME, decreased mobility, difficulty walking, decreased ROM, decreased strength, impaired perceived functional ability, impaired flexibility, impaired sensation, impaired UE functional use, and improper body mechanics.   ACTIVITY LIMITATIONS: stairs, transfers, and locomotion level  PARTICIPATION LIMITATIONS: cleaning, laundry, community activity, and yard work  PERSONAL FACTORS: 1-2 comorbidities: moderately  severe arthropathy   are also affecting patient's functional outcome.   REHAB POTENTIAL: Good  CLINICAL DECISION MAKING: Stable/uncomplicated  EVALUATION COMPLEXITY: Moderate  PLAN:  PT FREQUENCY: 1-2x/week  PT DURATION: 12 weeks  PLANNED INTERVENTIONS: 97110-Therapeutic exercises, 97530- Therapeutic activity, 97112- Neuromuscular re-education, 97535- Self Care, 16109- Manual therapy, [phone]- Gait training, [phone]- Orthotic Fit/training, [phone]- Aquatic Therapy, [phone]- Splinting, Balance training, Stair training, Dry Needling, Joint mobilization, Joint manipulation, Compression bandaging, DME instructions, Cryotherapy, and Moist heat  PLAN FOR NEXT SESSION:   Continues BLE strength, dynamic balance Provide Bed level HEP   Golden Pop PT ,DPT Physical Therapist- [place]  [facility]   3:03 PM 06/08/23

## 2023-06-10 ENCOUNTER — Institutional Professional Consult (permissible substitution) (INDEPENDENT_AMBULATORY_CARE_PROVIDER_SITE_OTHER): Payer: Medicare Other | Admitting: Thoracic Surgery (Cardiothoracic Vascular Surgery)

## 2023-06-10 ENCOUNTER — Encounter: Payer: Self-pay | Admitting: Thoracic Surgery (Cardiothoracic Vascular Surgery)

## 2023-06-10 ENCOUNTER — Other Ambulatory Visit: Payer: Self-pay | Admitting: Thoracic Surgery (Cardiothoracic Vascular Surgery)

## 2023-06-10 VITALS — BP 115/74 | HR 72 | Resp 18 | Ht 73.0 in | Wt 211.0 lb

## 2023-06-10 DIAGNOSIS — R911 Solitary pulmonary nodule: Secondary | ICD-10-CM

## 2023-06-10 NOTE — Progress Notes (Signed)
 PCP is Todd Nurse, MD Referring Provider is Todd Rigger, MD  Chief Complaint  Patient presents with   Lung Lesion    PET 1/8    HPI: Todd Farley send consultation regarding a bronchial abnormality of the right lower lobe.  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  The past 15 years she has had relatively frequent pulmonary infections.  Last August he had a particularly difficult episode.  He was on 3 different antibiotics over about a month.  He was referred to Dr. Karna Farley.  A CT of the chest showed focal wall thickening and luminal narrowing of the basilar segmental right lower lobe bronchial trunk.  There were linear opacities in the right lower lobe consistent with scarring and atelectasis.  He underwent bronchoscopy.  There was extrinsic narrowing of the right lower lobe bronchus just past the takeoff of the superior segmental bronchus.  Biopsy showed no evidence of malignancy.  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  Currently doing well from a respiratory standpoint.  He does always have a cough.  Usually productive.  Occasional wheezing.  Minimal smoking history and quit 50 years ago.  Activity is limited by left hip pain.  He walks with a cane.  Also has deformities of the feet.  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 Zubrod Score: At the time of surgery this patient's most appropriate activity status/level should be described as: []     0    Normal activity, no symptoms [x]     1    Restricted in physical strenuous activity but ambulatory, able to do out light work []     2    Ambulatory and capable of self care, unable to do work activities, up and about >50 % of waking hours                               []     3    Only limited self care, in bed greater than 50% of waking hours []     4    Completely disabled, no self care, confined to bed or chair []     5    Moribund  Past Medical History:  Diagnosis Date   Actinic keratosis    Anemia    Arthritis    Bronchial stenosis, right    Cancer (HCC)    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   Diabetes mellitus without complication (HCC)    GERD (gastroesophageal reflux disease)    High cholesterol    History of kidney stones    Hypertension    Neuropathy    Umbilical hernia     Past Surgical History:  Procedure Laterality Date   BRONCHIAL WASHINGS N/A 01/16/2023   Procedure: BRONCHIAL WASHINGS;  Surgeon: Todd Rigger, MD;  Location: [facility] ORS;  Service: Thoracic;  Laterality: N/A;   CARPAL TUNNEL RELEASE Right    COLONOSCOPY WITH PROPOFOL N/A 06/25/2015   Procedure: COLONOSCOPY WITH PROPOFOL;  Surgeon: Todd Jun, MD;  Location: [facility] ENDOSCOPY;  Service: Endoscopy;  Laterality: N/A;   ESOPHAGOGASTRODUODENOSCOPY (EGD) WITH PROPOFOL N/A 06/25/2015   Procedure: ESOPHAGOGASTRODUODENOSCOPY (EGD) WITH PROPOFOL;  Surgeon: Todd Jun, MD;  Location: [facility] END[facility]COPY;  Service: Endoscopy;  Laterality: N/A;   EYE SURGERY  eyelids x2   EYE SURGERY     cataract x 2 - intraocular implants   FLEXIBLE BRONCHOSCOPY N/A 01/16/2023   Procedure: FLEXIBLE BRONCHOSCOPY;  Surgeon: Todd Rigger, MD;  Location: [facility] ORS;  Service: Thoracic;  Laterality: N/A;   HERNIA REPAIR     JOINT REPLACEMENT Right    knee   KNEE ARTHROSCOPY Right    acl repair    History reviewed. No pertinent family history.  Social History Social History   Tobacco Use   Smoking status: Former    Types: Cigarettes   Smokeless tobacco: Never  Vaping Use   Vaping status: Never Used  Substance Use Topics   Alcohol use: Yes    Comment: rare   Drug use: No    Current Outpatient Medications  Medication Sig Dispense Refill   acetaminophen (TYLENOL) 500 MG tablet Take  1,000 mg by mouth every 6 (six) hours as needed. At bedtime and as needed     aspirin EC 81 MG tablet Take 81 mg by mouth daily. Swallow whole.     Cyanocobalamin (VITAMIN B-12) 3000 MCG SUBL Place under the tongue.     Ferrous Sulfate (IRON PO) Take 1 tablet by mouth daily.     fluocinonide gel (LIDEX) 0.05 % Apply 1 application  topically daily.     Folic Acid-B2-B6-B12-C-Choline (FOLIC ACID XTRA PO) Take by mouth.     glipiZIDE (GLUCOTROL XL) 2.5 MG 24 hr tablet Take 2.5 mg by mouth daily with breakfast.     ibuprofen (ADVIL) 400 MG tablet Take 400 mg by mouth daily. As needed     loratadine (CLARITIN) 10 MG tablet Take 10 mg by mouth daily.     losartan (COZAAR) 50 MG tablet Take 50 mg by mouth daily.     metFORMIN (GLUCOPHAGE) 500 MG tablet Take 1,000 mg by mouth daily with breakfast.     Multiple Vitamin (MULTIVITAMIN) tablet Take 1 tablet by mouth daily.     omeprazole (PRILOSEC) 40 MG capsule Take 40 mg by mouth daily.     simvastatin (ZOCOR) 20 MG tablet Take 20 mg by mouth daily. qam     No current facility-administered medications for this visit.    Allergies  Allergen Reactions   Other Shortness Of Breath    Siemese cats   Symbicort [Budesonide-Formoterol Fumarate]     Review of Systems  Constitutional:  Positive for activity change (Due to hip). Negative for unexpected weight change.  HENT:  Positive for hearing loss. Negative for trouble swallowing.   Respiratory:  Positive for cough and wheezing. Negative for shortness of breath.        Recurrent bronchitis  Cardiovascular:  Negative for chest pain and leg swelling.  Gastrointestinal:  Negative for abdominal pain.  Genitourinary:  Negative for difficulty urinating and dysuria.  Musculoskeletal:  Positive for arthralgias and gait problem.  Neurological:  Positive for numbness (Both feet).       Falls  Hematological:  Negative for adenopathy. Does not bruise/bleed easily.  All other systems reviewed and are  negative.   BP 115/74   Pulse 72   Resp 18   Ht 6\' 1"  (1.854 m)   Wt 211 lb (95.7 kg)   SpO2 96%   BMI 27.84 kg/m  Physical Exam Vitals reviewed.  Constitutional:      General: He is not in acute distress.    Appearance: Normal appearance.  HENT:     Head: Normocephalic and atraumatic.  Eyes:  General: No scleral icterus.    Extraocular Movements: Extraocular movements intact.  Cardiovascular:     Rate and Rhythm: Normal rate and regular rhythm.     Pulses: Normal pulses.     Heart sounds: Normal heart sounds. No murmur heard. Pulmonary:     Effort: Pulmonary effort is normal. No respiratory distress.     Breath sounds: Stridor (Right base, improved but still present after cough) present. No wheezing.  Abdominal:     General: There is no distension.     Palpations: Abdomen is soft.  Musculoskeletal:        General: Deformity (External deviation both feet) present.     Cervical back: Neck supple.  Lymphadenopathy:     Cervical: No cervical adenopathy.  Skin:    General: Skin is warm and dry.  Neurological:     General: No focal deficit present.     Mental Status: He is alert and oriented to person, place, and time.     Cranial Nerves: No cranial nerve deficit.     Gait: Gait abnormal.    Diagnostic Tests: CT CHEST WITH CONTRAST   TECHNIQUE: Multidetector CT imaging of the chest was performed during intravenous contrast administration.   RADIATION DOSE REDUCTION: This exam was performed according to the departmental dose-optimization program which includes automated exposure control, adjustment of the mA and/or kV according to patient size and/or use of iterative reconstruction technique.   CONTRAST:  75mL OMNIPAQUE IOHEXOL 300 MG/ML  SOLN   COMPARISON:  None Available.   FINDINGS: Cardiovascular: Normal heart size. No pericardial effusion. Normal caliber thoracic aorta with no significant atherosclerotic disease. Mild coronary artery calcifications.    Mediastinum/Nodes: Esophagus and thyroid are unremarkable. Enhancing focal wall thickening and luminal narrowing of the right lower lobe bronchus located just distal to the takeoff of the superior segmental bronchus, measures up to 5 mm in thickness axial series 2, image 106. No enlarged lymph nodes seen in the chest.   Lungs/Pleura: Central airways are patent. No consolidation, pleural effusion or pneumothorax. Mild linear opacities of the right lower lobe which are likely due to scarring or atelectasis.   Upper Abdomen: Diverticulosis.  No acute abnormality.   Musculoskeletal: No chest wall abnormality. No acute or significant osseous findings.   IMPRESSION: 1. 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 Recommend endoscopy for further evaluation. 2. Mild coronary artery calcifications.   These results will be called to the ordering clinician or representative by the Radiologist Assistant, and communication documented in the PACS or [employer].     Electronically Signed   By: Allegra Lai M.D.   On: 12/17/2022 08:26 I personally reviewed the CT and PET/CT images.  PET/CT showed no hypermetabolic enhancing lesions.  Focal wall thickening and luminal narrowing of right lower lobe bronchus much better seen on CT.  Quality of CT images on PET are not sufficient to say definitively whether there is been any change or not.  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  Focal narrowing of the right lower lobe bronchus-no endobronchial tumor seen.  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  With his history of frequent recurrent pulmonary infections over the past 15 years I suspect this  process is longstanding.  Seems to have progressed about 6 months ago.  Currently doing well, but I strongly suspect he will progress further into a recurrent pattern of ever worsening respiratory infections.  Decision making complicated by location of the lesion.  With definitely need another CT with contrast prior to surgery to assist with planning.  I do not think this is amenable to a sleeve type resection given the origin of the basilar segmental bronchi, but would not rule it out completely.  I think most likely this problem would require right lower lobectomy.  He has adequate pulmonary function to tolerate a lobectomy.  Decision making is further complicated by his severe left hip pain.  He is trying to schedule a left hip replacement.  They were waiting to schedule that until after he saw me regarding his bronchial lesion.  Since the biopsies did not show malignancy, I do not think there is any contraindication to proceeding with hip surgery.  He understands there is no guarantee that he will not get another respiratory infection which could potentially delay that operation.  Of course, that could also happen if he has surgery.  Ultimately I left the decision as to timing of the hip surgery versus the lung surgery up to him.  Is also his decision whether to have surgical resection or to just follow this "mass" radiographically.  I did describe the proposed operative procedure to Dr. Mrs. Laural Benes.  They understand the general nature of the procedure including the incisions to be used, the use of the surgical robot, the need for general anesthesia, the use of drains to postoperatively, the expected hospital stay, and the overall recovery.  I informed them of the indications, risks, benefits, and alternatives.  They understand the risks include, but not limited to death, MI, DVT, PE, bleeding,  possible need for transfusion, infection, prolonged air leak, cardiac arrhythmias, as well as possibility of other unforeseeable complications.  He is a relatively low risk patient for lobectomy in the absence of any cardiac history and being a non-smoker.  Dr. and Mrs. Veley wished to think over their options.  He is meeting with Dr. Karna Farley again on Friday.  He will call and let us know how he would like to proceed.  I do think if he proceeds with his lung surgery he likely would be ready for a hip replacement by the time they can do it in May anyway.  Plan: Follow-up with Dr. Kayleen Memos of Patient will call and let us know how he like to proceed. Will need CT chest with IV contrast prior to surgery or follow-up.  Loreli Slot, MD [facility] [phone]

## 2023-06-11 ENCOUNTER — Ambulatory Visit: Payer: Medicare Other | Admitting: Physical Therapy

## 2023-06-11 DIAGNOSIS — M6281 Muscle weakness (generalized): Secondary | ICD-10-CM

## 2023-06-11 DIAGNOSIS — R269 Unspecified abnormalities of gait and mobility: Secondary | ICD-10-CM

## 2023-06-11 DIAGNOSIS — R2681 Unsteadiness on feet: Secondary | ICD-10-CM

## 2023-06-11 NOTE — Therapy (Signed)
 OUTPATIENT PHYSICAL THERAPY NEURO TREATMENT    Patient Name: Todd Farley MRN: [id number] DOB:02-17-1946, 78 y.o., male Today's Date: 06/11/2023   PCP: Gracelyn Nurse, MD  REFERRING PROVIDER: Lonell Face, MD   END OF SESSION:  PT End of Session - 06/11/23 1452     Visit Number 13    Number of Visits 24    Date for PT Re-Evaluation 07/20/23    Progress Note Due on Visit 20    PT Start Time 1446    PT Stop Time 1525    PT Time Calculation (min) 39 min    Equipment Utilized During Treatment Gait belt    Activity Tolerance Patient tolerated treatment well    Behavior During Therapy WFL for tasks assessed/performed                 Past Medical History:  Diagnosis Date   Actinic keratosis    Anemia    Arthritis    Bronchial stenosis, right    Cancer (HCC)    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   Diabetes mellitus without complication (HCC)    GERD (gastroesophageal reflux disease)    High cholesterol    History of kidney stones    Hypertension    Neuropathy    Umbilical hernia    Past Surgical History:  Procedure Laterality Date   BRONCHIAL WASHINGS N/A 01/16/2023   Procedure: BRONCHIAL WASHINGS;  Surgeon: [name], MD;  Location: [facility] ORS;  Service: Thoracic;  Laterality: N/A;   CARPAL TUNNEL RELEASE Right    COLONOSCOPY WITH PROPOFOL N/A 06/25/2015   Procedure: COLONOSCOPY WITH PROPOFOL;  Surgeon: Scot Jun, MD;  Location: [facility] ENDOSCOPY;  Service: Endoscopy;  Laterality: N/A;   ESOPHAGOGASTRODUODENOSCOPY (EGD) WITH PROPOFOL N/A 06/25/2015   Procedure: ESOPHAGOGASTRODUODENOSCOPY (EGD) WITH PROPOFOL;  Surgeon: Scot Jun, MD;  Location[facility] ENDOSCOPY;  Service: Endoscopy;  Laterality: N/A;   EYE SURGERY     eyelids x2   EYE SURGERY     cataract x 2 - intraocular implants   FLEXIBLE BRONCHOSCOPY N/A 01/16/2023   Procedure: FLEXIBLE BRONCHOSCOPY;  Surgeon: [name], MD;  Location: [facility] ORS;  Service: Thoracic;  Laterality: N/A;    HERNIA REPAIR     JOINT REPLACEMENT Right    knee   KNEE ARTHROSCOPY Right    acl repair   Patient Active Problem List   Diagnosis Date Noted   Umbilical hernia without obstruction and without gangrene 01/26/2018   Diabetic polyneuropathy associated with type 2 diabetes mellitus (HCC) 10/13/2017   GERD (gastroesophageal reflux disease) 04/13/2015   Diabetes mellitus type 2, uncomplicated (HCC) 03/14/2014   Hypertension 03/14/2014    ONSET DATE: >1 year  REFERRING DIAG:  Diagnosis  R26.89 (ICD-10-CM) - Imbalance    THERAPY DIAG:  Abnormality of gait and mobility  Unsteadiness on feet  Muscle weakness (generalized)  Rationale for Evaluation and Treatment: Rehabilitation  SUBJECTIVE:  SUBJECTIVE STATEMENT:  Patient reports that he is doing well. Is having a lot of soreness and discomfort in the L hip today. Pt reports he is cancer free but is having some bronchial occulsion. Pt reports MD says hip surgery could come first prior to the procedure.    From eval:  Reports that he has profound neuropathy in bilateral LE. Has had 2 falls in the last year. One when taking garbage can and and then while at the [employer]. Mild-moderate/ head trauma when each fall. Last fall was in the summer 2024. Recently diagnosed "moderately severe arthropathy" in the left hip.  Pain in standing and mobility in the L hip.   Pt accompanied by: self  PERTINENT HISTORY:  Recent MD appointment for hip pain.   "Left hip pain. He has known osteoarthritis. Is been getting worse. He did have PET scan done just recently which had an incidental finding of moderately severe arthropathy in the left hip. Have made referral to orthopedics for further workup and treatment. " From Neurology:  "Patient states his  neuropathy symptoms are worse. Increased numbness in his feet. At times is he is not paying attention he does not known where his feet are. His balance is worse. He has had two falls since his last visit. Using a cane to ambulate now. He was seen at [facility] on 09/09/2022 after a fall. This fall was mechanical. He was wheeling his garbage cans to the curb early in the morning on minimal sleep. Prior to this, had a mechanical fall over a terrain change. Has difficulty with prolonged standing secondary to worsening imbalance and pain (left > right). Denies fasciculations, cramping, myalgia, foot drop. "  PAIN:  Are you having pain? Yes: NPRS scale: 5/10  Pain location: bil feel and L hip  Pain description: ache in bil feet. Sharp in the hip  Aggravating factors: weight bearing  Relieving factors: rest.   PRECAUTIONS: Fall  RED FLAGS: None   WEIGHT BEARING RESTRICTIONS: No  FALLS: Has patient fallen in last 6 months? No and multiple near falls   LIVING ENVIRONMENT: Lives with: lives with their spouse Lives in: House/apartment Stairs: Yes: Internal: 15 steps; on left going up and External: 3 steps; on right going up, on left going up, and can reach both Has following equipment at home: Single point cane  PLOF: Independent and Independent with basic ADLs has been using [facility] for roughly the last year.   PATIENT GOALS: improve balance, strength, endurance   OBJECTIVE:  Note: Objective measures were completed at Evaluation unless otherwise noted.  DIAGNOSTIC FINDINGS:  EXAM: NUCLEAR MEDICINE PET SKULL BASE TO THIGH IMPRESSION: 1. No findings suspicious for thoracic malignancy. No definite residual wall thickening or hypermetabolic activity within the proximal right lower lobe bronchus. Correlate with prior bronchoscopy results and consider follow-up chest CT in 3-6 months, as clinically warranted. 2. No suspicious metabolic activity identified  within the neck, abdomen or pelvis. 3. Healing fracture of the right 4th rib anteriorly with low level metabolic activity. 4. Hepatic steatosis and colonic diverticulosis.  COGNITION: Overall cognitive status: Within functional limits for tasks assessed   SENSATION: Hx of diabetic neuropathy in bil ft and ankles. Medial worse than lateral    COORDINATION: Grossly WFL, but mild foot drop on the RLE.   EDEMA:  Mild bil distal edema.   MUSCLE TONE: WFL  MUSCLE LENGTH:   POSTURE: forward head and posterior pelvic tilt  LOWER EXTREMITY ROM:  Grossly WFL   LOWER EXTREMITY MMT:    MMT Right Eval Left Eval  Hip flexion 4 4-  Hip extension    Hip abduction 4+ 4+  Hip adduction 4+ 4  Hip internal rotation    Hip external rotation    Knee flexion 4+ 4-  Knee extension 5 4-  Ankle dorsiflexion 4 4-  Ankle plantarflexion    Ankle inversion    Ankle eversion    (Blank rows = not tested)  TRANSFERS: Assistive device utilized: None  Sit to stand: Modified independence Stand to sit: Complete Independence and Modified independence Chair to chair: Modified independence Floor:  to be assessed. Required assist with recovery from fall > 6 months ago   RAMP:  Level of Assistance: Modified independence Assistive device utilized: Single point cane Ramp Comments:  CURB:  Level of Assistance: Modified independence Assistive device utilized:  rail  Curb Comments:   STAIRS: Level of Assistance: Modified independence Stair Negotiation Technique: Step to Pattern Alternating Pattern  with Bilateral Rails Number of Stairs: 4  Height of Stairs: 6  Comments: step to ascent, step through descent   GAIT: Gait pattern:  excessive hip ER and ankle eversion on the RLE, decreased ankle dorsiflexion- Left, Right foot flat, Left foot flat, lateral hip instability, wide BOS, and poor foot clearance- Left Distance walked: 60 Assistive device utilized: Single point cane and  None Level of assistance: Modified independence Comments: noted to have lateral hip instability with increased time in standing   FUNCTIONAL TESTS:  5 times sit to stand: 15.5 sec  Timed up and go (TUG): 21.54 6 minute walk test: TBD 10 meter walk test: 15.2sec  Berg Balance Scale: TBD   PATIENT SURVEYS:  ABC scale 77.5% 3/3 75%                                                                                                                             TREATMENT DATE: 06/11/23   TE  Nustep level 3 x 6 min  Seated ankle inversion 2 x 10 reps ea LE GTB R and RTB Left  LAQ 3 x 10 with 5# and with adductor squeeze on ball throughout  Reciprocal march 2 x 20 with 5# Seated hip IR against band for increased range of motion 2 x 10 RTB   TA  Weighted gait 5# AW  training  x 311ft with SPC.  Lateral hip ER step out then return to start postion over hurdle 2 x 10 reps   Cues for full ROM throughout and increased posture and step length with gait training.    PATIENT EDUCATION: Education details: POC. Benefits of dynamic balance training.  Person educated: Patient Education method: Explanation Education comprehension: verbalized understanding  HOME EXERCISE PROGRAM: Access Code: [email or web address] URL: [email or web address] Date: 04/30/2023 Prepared by: Thresa Ross  Exercises - Small Range Straight Leg Raise  - 1 x daily - 7 x weekly - 3 sets - 10 reps -  Supine Bridge  - 3-4 x weekly - 3 sets - 10 reps - Seated Long Arc Quad  - 2-3 x daily - 7 x weekly - 3 sets - 10 reps - Seated Ankle Pumps  - 1 x daily - 7 x weekly - 3 sets - 10 reps - Seated March  - 1 x daily - 7 x weekly - 3 sets - 10 reps - Standing March with Counter Support  - 1 x daily - 7 x weekly - 3 sets - 8 reps - Sit to Stand with Counter Support  - 1 x daily - 7 x weekly - 3 sets - 5 reps   GOALS: Goals reviewed with patient? Yes   SHORT TERM GOALS: Target date: 05/26/2023  Patient will be  independent in home exercise program to improve strength/mobility for better functional independence with ADLs. Baseline: provided 1/30 Goal status: IN PROGRESS   LONG TERM GOALS: Target date: 07/21/2023  Patient will increase ABC score by equal to or greater than  8%   to demonstrate statistically significant improvement in mobility and quality of life.  Baseline: 77.5  3/3:75 Goal status: IN PROGRESS  2.  Patient (> 75 years old) will complete five times sit to stand test in < 15 seconds indicating an increased LE strength and improved balance Baseline:15.5sec 06/01/23: 14.71 Goal status: IN PROGRESS  3.  Patient will increase Berg Balance score by > 6 points to demonstrate decreased fall risk during functional activities Baseline: 41 06/01/23: 46 Goal status: IN PROGRESS  4.  Patient will increase 10 meter walk test to >1.41m/s as to improve gait speed for better community ambulation and to reduce fall risk. Baseline: 0.29m/s(15.2 sec)  3/3: 0.53m/s  Goal status: IN PROGRESS  5.  Patient will reduce timed up and go to <11 seconds to reduce fall risk and demonstrate improved transfer/gait ability. Baseline: 21.54sec 3/3: 14.24 sec avg.  Goal status: IN PROGRESS    ASSESSMENT:  CLINICAL IMPRESSION:  Patient arrived with good motivation for completion of pt activities.  PT treatment focused on improved BLE strength and functional hip strengthening and range of motion exercises as able.  No reports of increased pain on this day. Pt will continue to benefit from skilled physical therapy intervention to address impairments, improve QOL, and attain therapy goals.      OBJECTIVE IMPAIRMENTS: Abnormal gait, decreased activity tolerance, decreased balance, decreased endurance, decreased knowledge of condition, decreased knowledge of use of DME, decreased mobility, difficulty walking, decreased ROM, decreased strength, impaired perceived functional ability, impaired flexibility, impaired  sensation, impaired UE functional use, and improper body mechanics.   ACTIVITY LIMITATIONS: stairs, transfers, and locomotion level  PARTICIPATION LIMITATIONS: cleaning, laundry, community activity, and yard work  PERSONAL FACTORS: 1-2 comorbidities: moderately severe arthropathy   are also affecting patient's functional outcome.   REHAB POTENTIAL: Good  CLINICAL DECISION MAKING: Stable/uncomplicated  EVALUATION COMPLEXITY: Moderate  PLAN:  PT FREQUENCY: 1-2x/week  PT DURATION: 12 weeks  PLANNED INTERVENTIONS: 97110-Therapeutic exercises, 97530- Therapeutic activity, [id number]- Neuromuscular re-education, 97535- Self Care, [id number]- Manual therapy, [phone]- Gait training, [phone]- Orthotic Fit/training, [phone]- Aquatic Therapy, [phone]- Splinting, Balance training, Stair training, Dry Needling, Joint mobilization, Joint manipulation, Compression bandaging, DME instructions, Cryotherapy, and Moist heat  PLAN FOR NEXT SESSION:   Continues BLE strength, dynamic balance Provide Bed level HEP   Norman Herrlich PT ,DPT Physical Therapist- [facility]   2:53 PM 06/11/23

## 2023-06-15 ENCOUNTER — Ambulatory Visit: Payer: Medicare Other

## 2023-06-15 DIAGNOSIS — R269 Unspecified abnormalities of gait and mobility: Secondary | ICD-10-CM | POA: Diagnosis not present

## 2023-06-15 DIAGNOSIS — R2681 Unsteadiness on feet: Secondary | ICD-10-CM

## 2023-06-15 DIAGNOSIS — M6281 Muscle weakness (generalized): Secondary | ICD-10-CM

## 2023-06-15 NOTE — Therapy (Signed)
 OUTPATIENT PHYSICAL THERAPY TREATMENT    Patient Name: Todd Farley MRN: [id number] DOB:1946/03/07, 78 y.o., male Today's Date: 06/15/2023   PCP: Gracelyn Nurse, MD  REFERRING PROVIDER: Lonell Face, MD   END OF SESSION:  PT End of Session - 06/15/23 1449     Visit Number 14    Number of Visits 24    Date for PT Re-Evaluation 07/20/23    Authorization Type Medicare A&B; A[employer] secondary    Progress Note Due on Visit 20    PT Start Time 1445    PT Stop Time 1525    PT Time Calculation (min) 40 min    Equipment Utilized During Treatment Gait belt    Activity Tolerance Patient tolerated treatment well;No increased pain    Behavior During Therapy WFL for tasks assessed/performed                 Past Medical History:  Diagnosis Date   Actinic keratosis    Anemia    Arthritis    Bronchial stenosis, right    Cancer (HCC)    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 ENDOSCOPY;  Service: Endoscopy;  Laterality: N/A;   ESOPHAGOGASTRODUODENOSCOPY (EGD) WITH PROPOFOL N/A 06/25/2015   Procedure: ESOPHAGOGASTRODUODENOSCOPY (EGD) WITH PROPOFOL;  Surgeon: Scot Jun, MD;  [facility]: [facility] ENDOSCOPY;  Service: Endoscopy;  Laterality: N/A;   EYE SURGERY     eyelids x2   EYE SURGERY     cataract x 2 - intraocular implants   FLEXIBLE BRONCHOSCOPY N/A 01/16/2023   Procedure: FLEXIBLE BRONCHOSCOPY;  Surgeon: [name], MD;   Location: [facility] ORS;  Service: Thoracic;  Laterality: N/A;   HERNIA REPAIR     JOINT REPLACEMENT Right    knee   KNEE ARTHROSCOPY Right    acl repair   Patient Active Problem List   Diagnosis Date Noted   Umbilical hernia without obstruction and without gangrene 01/26/2018   Diabetic polyneuropathy associated with type 2 diabetes mellitus (HCC) 10/13/2017   GERD (gastroesophageal reflux disease) 04/13/2015   Diabetes mellitus type 2, uncomplicated (HCC) 03/14/2014   Hypertension 03/14/2014    ONSET DATE: >1 year  REFERRING DIAG:  Diagnosis  R26.89 (ICD-10-CM) - Imbalance    THERAPY DIAG:  Abnormality of gait and mobility  Unsteadiness on feet  Muscle weakness (generalized)  Rationale for Evaluation and Treatment: Rehabilitation  SUBJECTIVE:  SUBJECTIVE STATEMENT: Doing well in general. HEP as tolerated. No scheduled to see Dr. Audelia Acton for THA consult.   PERTINENT HISTORY:  Recent MD appointment for hip pain.   "Left hip pain. He has known osteoarthritis. Is been getting worse. He did have PET scan done just recently which had an incidental finding of moderately severe arthropathy in the left hip. Have made referral to orthopedics for further workup and treatment. " From Neurology:  "Patient states his neuropathy symptoms are worse. Increased numbness in his feet. At times is he is not paying attention he does not known where his feet are. His balance is worse. He has had two falls since his last visit. Using a cane to ambulate now. He was seen at [facility] on 09/09/2022 after a fall. This fall was mechanical. He was wheeling his garbage cans to the curb early in the morning on minimal sleep. Prior to this, had a mechanical fall over a terrain change. Has difficulty with  prolonged standing secondary to worsening imbalance and pain (left > right). Denies fasciculations, cramping, myalgia, foot drop. "  PAIN:  Are you having pain? 3-4/10   PRECAUTIONS: Fall  RED FLAGS: None   WEIGHT BEARING RESTRICTIONS: No  FALLS: Has patient fallen in last 6 months? No and multiple near falls   PATIENT GOALS: improve balance, strength, endurance   OBJECTIVE:  Note: Objective measures were completed at Evaluation unless otherwise noted.  DIAGNOSTIC FINDINGS:  EXAM: NUCLEAR MEDICINE PET SKULL BASE TO THIGH IMPRESSION: 1. No findings suspicious for thoracic malignancy. No definite residual wall thickening or hypermetabolic activity within the proximal right lower lobe bronchus. Correlate with prior bronchoscopy results and consider follow-up chest CT in 3-6 months, as clinically warranted. 2. No suspicious metabolic activity identified within the neck, abdomen or pelvis. 3. Healing fracture of the right 4th rib anteriorly with low level metabolic activity. 4. Hepatic steatosis and colonic diverticulosis.  COGNITION: Overall cognitive status: Within functional limits for tasks assessed   SENSATION: Hx of diabetic neuropathy in bil ft and ankles. Medial worse than lateral    COORDINATION: Grossly WFL, but mild foot drop on the RLE.   EDEMA:  Mild bil distal edema  POSTURE:  forward head and posterior pelvic tilt  LOWER EXTREMITY ROM:    Grossly WFL   LOWER EXTREMITY MMT:    MMT Right Eval Left Eval  Hip flexion 4 4-  Hip extension    Hip abduction 4+ 4+  Hip adduction 4+ 4  Hip internal rotation    Hip external rotation    Knee flexion 4+ 4-  Knee extension 5 4-  Ankle dorsiflexion 4 4-  Ankle plantarflexion    Ankle inversion    Ankle eversion    (Blank rows = not tested)  TRANSFERS: Assistive device utilized: None  Sit to stand: Modified independence Stand to sit: Complete Independence and Modified independence Chair to chair:  Modified independence Floor:  to be assessed. Required assist with recovery from fall > 6 months ago   RAMP:  Level of Assistance: Modified independence Assistive device utilized: Single point cane Ramp Comments:  CURB:  Level of Assistance: Modified independence Assistive device utilized:  rail  Curb Comments:   STAIRS: Level of Assistance: Modified independence Stair Negotiation Technique: Step to Pattern Alternating Pattern  with Bilateral Rails Number of Stairs: 4  Height of Stairs: 6  Comments: step to ascent, step through descent   GAIT: Gait pattern:  excessive hip ER  and ankle eversion on the RLE, decreased ankle dorsiflexion- Left, Right foot flat, Left foot flat, lateral hip instability, wide BOS, and poor foot clearance- Left Distance walked: 60 Assistive device utilized: Single point cane and None Level of assistance: Modified independence Comments: noted to have lateral hip instability with increased time in standing   FUNCTIONAL TESTS:  5 times sit to stand: 15.5 sec  Timed up and go (TUG): 21.54 6 minute walk test: TBD 10 meter walk test: 15.2sec  Berg Balance Scale: TBD   PATIENT SURVEYS:  ABC scale 77.5% 3/3 75%                                                                                                                             TREATMENT DATE: 06/15/23 -AA/ROM Nustep seat 11, arms 11, level 3 x6 minutes (average SPM ~80s)  -STS from chair, hands free 2x10  -LAQ 1x20 alteranting with ball adductor squeeze -standing alterante wide knee marching, 5lb AW 1x20  -LAQ 1x20 alteranting with ball adductor squeeze -standing alterante wide knee marching, 5lb AW 1x20  -LAQ 1x20 alteranting with ball adductor squeeze  Balance activity based on recent BBT limitations:  -6 cone placement on floor bend forward, place on floor, side step and repeat, then reverse for pickup  -6 cone out and reach place on author's cow, then retried (twice)  -rotate  -standing  trunk rotation and wall rebound green physio ball off door behind pt x10, minGuard assist.   [facility] PT Assessment - 06/15/23 0001       Berg Balance Test   From Standing, Reach Forward with Outstretched Arm --   activity   From Standing Position, Pick up Object from Floor --   activity   From Standing Position, Turn to Look Behind Over each Shoulder --   activity             PATIENT EDUCATION: Education details: POC. Benefits of dynamic balance training.  Person educated: Patient Education method: Explanation Education comprehension: verbalized understanding  HOME EXERCISE PROGRAM: Access Code: [email or web address] URL: [email or web address] Date: 04/30/2023 Prepared by: Thresa Ross  Exercises - Small Range Straight Leg Raise  - 1 x daily - 7 x weekly - 3 sets - 10 reps - Supine Bridge  - 3-4 x weekly - 3 sets - 10 reps - Seated Long Arc Quad  - 2-3 x daily - 7 x weekly - 3 sets - 10 reps - Seated Ankle Pumps  - 1 x daily - 7 x weekly - 3 sets - 10 reps - Seated March  - 1 x daily - 7 x weekly - 3 sets - 10 reps - Standing March with Counter Support  - 1 x daily - 7 x weekly - 3 sets - 8 reps - Sit to Stand with Counter Support  - 1 x daily - 7 x weekly - 3 sets - 5 reps   GOALS: Goals reviewed with patient? Yes  SHORT  TERM GOALS: Target date: 05/26/2023 Patient will be independent in home exercise program to improve strength/mobility for better functional independence with ADLs. Baseline: provided 1/30 Goal status: IN PROGRESS  LONG TERM GOALS: Target date: 07/21/2023  Patient will increase ABC score by equal to or greater than  8%   to demonstrate statistically significant improvement in mobility and quality of life.  Baseline: 77.5  3/3:75 Goal status: IN PROGRESS  2.  Patient (> 78 years old) will complete five times sit to stand test in < 15 seconds indicating an increased LE strength and improved balance Baseline:15.5sec 06/01/23: 14.71 Goal status: IN  PROGRESS  3.  Patient will increase Berg Balance score by > 6 points to demonstrate decreased fall risk during functional activities Baseline: 41 06/01/23: 46 Goal status: IN PROGRESS  4.  Patient will increase 10 meter walk test to >1.30m/s as to improve gait speed for better community ambulation and to reduce fall risk. Baseline: 0.71m/s(15.2 sec)  3/3: 0.46m/s  Goal status: IN PROGRESS  5.  Patient will reduce timed up and go to <11 seconds to reduce fall risk and demonstrate improved transfer/gait ability. Baseline: 21.54sec 3/3: 14.24 sec avg.  Goal status: IN PROGRESS  ASSESSMENT: CLINICAL IMPRESSION: Continued with various interventions aimed at achieving LT goals of care, some new and some old. Attempted to avoid unnecessary exacerbation of hip OA pain. Pt takes NSAIDS in anticipation of session, pain remains at goal for pain. Pt will continue to benefit from skilled physical therapy intervention to address impairments, improve QOL, and attain therapy goals.    OBJECTIVE IMPAIRMENTS: Abnormal gait, decreased activity tolerance, decreased balance, decreased endurance, decreased knowledge of condition, decreased knowledge of use of DME, decreased mobility, difficulty walking, decreased ROM, decreased strength, impaired perceived functional ability, impaired flexibility, impaired sensation, impaired UE functional use, and improper body mechanics.   ACTIVITY LIMITATIONS: stairs, transfers, and locomotion level  PARTICIPATION LIMITATIONS: cleaning, laundry, community activity, and yard work  PERSONAL FACTORS: 1-2 comorbidities: moderately severe arthropathy   are also affecting patient's functional outcome.   REHAB POTENTIAL: Good  CLINICAL DECISION MAKING: Stable/uncomplicated  EVALUATION COMPLEXITY: Moderate  PLAN:  PT FREQUENCY: 1-2x/week  PT DURATION: 12 weeks  PLANNED INTERVENTIONS: 97110-Therapeutic exercises, 97530- Therapeutic activity, 97112- Neuromuscular  re-education, 97535- Self Care, 78295- Manual therapy, [phone]- Gait training, [phone]- Orthotic Fit/training, [phone]- Aquatic Therapy, [phone]- Splinting, Balance training, Stair training, Dry Needling, Joint mobilization, Joint manipulation, Compression bandaging, DME instructions, Cryotherapy, and Moist heat  PLAN FOR NEXT SESSION:  Continues BLE strength, dynamic balance  Rosamaria Lints PT ,DPT Physical Therapist- [facility]   2:51 PM 06/15/23   2:51 PM, 06/15/23 Rosamaria Lints, PT, DPT Physical Therapist - [facility]  Outpatient Physical Therapy- Main Campus [phone]

## 2023-06-17 ENCOUNTER — Ambulatory Visit: Payer: Medicare Other | Admitting: Physical Therapy

## 2023-06-17 DIAGNOSIS — R2681 Unsteadiness on feet: Secondary | ICD-10-CM

## 2023-06-17 DIAGNOSIS — R269 Unspecified abnormalities of gait and mobility: Secondary | ICD-10-CM | POA: Diagnosis not present

## 2023-06-17 DIAGNOSIS — M6281 Muscle weakness (generalized): Secondary | ICD-10-CM

## 2023-06-17 NOTE — Therapy (Signed)
 OUTPATIENT PHYSICAL THERAPY TREATMENT    Patient Name: Todd Farley MRN: [id number] DOB:1945/08/10, 78 y.o., male Today's Date: 06/17/2023   PCP: Gracelyn Nurse, MD  REFERRING PROVIDER: Lonell Face, MD   END OF SESSION:  PT End of Session - 06/17/23 1452     Visit Number 15    Number of Visits 24    Date for PT Re-Evaluation 07/20/23    Authorization Type Medicare A&B; AARP secondary    Progress Note Due on Visit 20    PT Start Time 1452    PT Stop Time 1531    PT Time Calculation (min) 39 min    Equipment Utilized During Treatment Gait belt    Activity Tolerance Patient tolerated treatment well;No increased pain    Behavior During Therapy WFL for tasks assessed/performed                 Past Medical History:  Diagnosis Date   Actinic keratosis    Anemia    Arthritis    Bronchial stenosis, right    Cancer (HCC)    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   Diabetes mellitus without complication (HCC)    GERD (gastroesophageal reflux disease)    High cholesterol    History of kidney stones    Hypertension    Neuropathy    Umbilical hernia    Past Surgical History:  Procedure Laterality Date   BRONCHIAL WASHINGS N/A 01/16/2023   Procedure: BRONCHIAL WASHINGS;  Surgeon: [name], MD;  Location: [facility] ORS;  Service: Thoracic;  Laterality: N/A;   CARPAL TUNNEL RELEASE Right    COLONOSCOPY WITH PROPOFOL N/A 06/25/2015   Procedure: COLONOSCOPY WITH PROPOFOL;  Surgeon: Scot Jun, MD;  Location: [facility] ENDOSCOPY;  Service: Endoscopy;  Laterality: N/A;   ESOPHAGOGASTRODUODENOSCOPY (EGD) WITH PROPOFOL N/A 06/25/2015   Procedure: ESOPHAGOGASTRODUODENOSCOPY (EGD) WITH PROPOFOL;  Surgeon: Scot Jun, MD;  Location: [facility] ENDOSCOPY;  Service: Endoscopy;  Laterality: N/A;   EYE SURGERY     eyelids x2   EYE SURGERY     cataract x 2 - intraocular implants   FLEXIBLE BRONCHOSCOPY N/A 01/16/2023   Procedure: FLEXIBLE BRONCHOSCOPY;  Surgeon: [name], MD;   Location: [facility] ORS;  Service: Thoracic;  Laterality: N/A;   HERNIA REPAIR     JOINT REPLACEMENT Right    knee   KNEE ARTHROSCOPY Right    acl repair   Patient Active Problem List   Diagnosis Date Noted   Umbilical hernia without obstruction and without gangrene 01/26/2018   Diabetic polyneuropathy associated with type 2 diabetes mellitus (HCC) 10/13/2017   GERD (gastroesophageal reflux disease) 04/13/2015   Diabetes mellitus type 2, uncomplicated (HCC) 03/14/2014   Hypertension 03/14/2014    ONSET DATE: >1 year  REFERRING DIAG:  Diagnosis  R26.89 (ICD-10-CM) - Imbalance    THERAPY DIAG:  Abnormality of gait and mobility  Unsteadiness on feet  Muscle weakness (generalized)  Rationale for Evaluation and Treatment: Rehabilitation  SUBJECTIVE:  SUBJECTIVE STATEMENT: Doing well in general. Reports that follow-up with orthopedic MD will be in April, unless there is a cancellation. Reports that yesterday was "rough" due to heavy PT treatment 3/17   PERTINENT HISTORY:  Recent MD appointment for hip pain.   "Left hip pain. He has known osteoarthritis. Is been getting worse. He did have PET scan done just recently which had an incidental finding of moderately severe arthropathy in the left hip. Have made referral to orthopedics for further workup and treatment. " From Neurology:  "Patient states his neuropathy symptoms are worse. Increased numbness in his feet. At times is he is not paying attention he does not known where his feet are. His balance is worse. He has had two falls since his last visit. Using a cane to ambulate now. He was seen at [facility] on 09/09/2022 after a fall. This fall was mechanical. He was wheeling his garbage cans to the curb early in the morning on  minimal sleep. Prior to this, had a mechanical fall over a terrain change. Has difficulty with prolonged standing secondary to worsening imbalance and pain (left > right). Denies fasciculations, cramping, myalgia, foot drop. "  PAIN:  Are you having pain? 3-4/10   PRECAUTIONS: Fall  RED FLAGS: None   WEIGHT BEARING RESTRICTIONS: No  FALLS: Has patient fallen in last 6 months? No and multiple near falls   PATIENT GOALS: improve balance, strength, endurance   OBJECTIVE:  Note: Objective measures were completed at Evaluation unless otherwise noted.  DIAGNOSTIC FINDINGS:  EXAM: NUCLEAR MEDICINE PET SKULL BASE TO THIGH IMPRESSION: 1. No findings suspicious for thoracic malignancy. No definite residual wall thickening or hypermetabolic activity within the proximal right lower lobe bronchus. Correlate with prior bronchoscopy results and consider follow-up chest CT in 3-6 months, as clinically warranted. 2. No suspicious metabolic activity identified within the neck, abdomen or pelvis. 3. Healing fracture of the right 4th rib anteriorly with low level metabolic activity. 4. Hepatic steatosis and colonic diverticulosis.  COGNITION: Overall cognitive status: Within functional limits for tasks assessed   SENSATION: Hx of diabetic neuropathy in bil ft and ankles. Medial worse than lateral    COORDINATION: Grossly WFL, but mild foot drop on the RLE.   EDEMA:  Mild bil distal edema  POSTURE:  forward head and posterior pelvic tilt  LOWER EXTREMITY ROM:    Grossly WFL   LOWER EXTREMITY MMT:    MMT Right Eval Left Eval  Hip flexion 4 4-  Hip extension    Hip abduction 4+ 4+  Hip adduction 4+ 4  Hip internal rotation    Hip external rotation    Knee flexion 4+ 4-  Knee extension 5 4-  Ankle dorsiflexion 4 4-  Ankle plantarflexion    Ankle inversion    Ankle eversion    (Blank rows = not tested)  TRANSFERS: Assistive device utilized: None  Sit to stand:  Modified independence Stand to sit: Complete Independence and Modified independence Chair to chair: Modified independence Floor:  to be assessed. Required assist with recovery from fall > 6 months ago   RAMP:  Level of Assistance: Modified independence Assistive device utilized: Single point cane Ramp Comments:  CURB:  Level of Assistance: Modified independence Assistive device utilized:  rail  Curb Comments:   STAIRS: Level of Assistance: Modified independence Stair Negotiation Technique: Step to Pattern Alternating Pattern  with Bilateral Rails Number of Stairs: 4  Height of Stairs: 6  Comments: step  to ascent, step through descent   GAIT: Gait pattern:  excessive hip ER and ankle eversion on the RLE, decreased ankle dorsiflexion- Left, Right foot flat, Left foot flat, lateral hip instability, wide BOS, and poor foot clearance- Left Distance walked: 60 Assistive device utilized: Single point cane and None Level of assistance: Modified independence Comments: noted to have lateral hip instability with increased time in standing   FUNCTIONAL TESTS:  5 times sit to stand: 15.5 sec  Timed up and go (TUG): 21.54 6 minute walk test: TBD 10 meter walk test: 15.2sec  Berg Balance Scale: TBD   PATIENT SURVEYS:  ABC scale 77.5% 3/3 75%                                                                                                                             TREATMENT DATE: 06/17/23 -AA/ROM Nustep seat 11, arms 11, level 4x6 minutes (average SPM ~80s)   Supine therex SAQ x 10  Heel slide x 15  Hip abduction GTB x 15 Hip/knee flexion 4# AW x 12 Bridge 2 x 12 bil  Quad/glute set 2 x 15 bil  SLR x 10 bil   Cues for full ROM in pain free range. Pt reports increased pain with hip flexion movements. But willing to attempt entire set, despite pain.   PATIENT EDUCATION: Education details: POC. Benefits of dynamic balance training.  Person educated: Patient Education method:  Explanation Education comprehension: verbalized understanding  HOME EXERCISE PROGRAM: Access Code: [email or web address] URL: [email or web address] Date: 04/30/2023 Prepared by: Thresa Ross  Exercises - Small Range Straight Leg Raise  - 1 x daily - 7 x weekly - 3 sets - 10 reps - Supine Bridge  - 3-4 x weekly - 3 sets - 10 reps - Seated Long Arc Quad  - 2-3 x daily - 7 x weekly - 3 sets - 10 reps - Seated Ankle Pumps  - 1 x daily - 7 x weekly - 3 sets - 10 reps - Seated March  - 1 x daily - 7 x weekly - 3 sets - 10 reps - Standing March with Counter Support  - 1 x daily - 7 x weekly - 3 sets - 8 reps - Sit to Stand with Counter Support  - 1 x daily - 7 x weekly - 3 sets - 5 reps   GOALS: Goals reviewed with patient? Yes  SHORT TERM GOALS: Target date: 05/26/2023 Patient will be independent in home exercise program to improve strength/mobility for better functional independence with ADLs. Baseline: provided 1/30 Goal status: IN PROGRESS  LONG TERM GOALS: Target date: 07/21/2023  Patient will increase ABC score by equal to or greater than  8%   to demonstrate statistically significant improvement in mobility and quality of life.  Baseline: 77.5  3/3:75 Goal status: IN PROGRESS  2.  Patient (> 41 years old) will complete five times sit to stand test in < 15 seconds indicating an increased LE  strength and improved balance Baseline:15.5sec 06/01/23: 14.71 Goal status: IN PROGRESS  3.  Patient will increase Berg Balance score by > 6 points to demonstrate decreased fall risk during functional activities Baseline: 41 06/01/23: 46 Goal status: IN PROGRESS  4.  Patient will increase 10 meter walk test to >1.71m/s as to improve gait speed for better community ambulation and to reduce fall risk. Baseline: 0.25m/s(15.2 sec)  3/3: 0.5m/s  Goal status: IN PROGRESS  5.  Patient will reduce timed up and go to <11 seconds to reduce fall risk and demonstrate improved transfer/gait  ability. Baseline: 21.54sec 3/3: 14.24 sec avg.  Goal status: IN PROGRESS  ASSESSMENT: CLINICAL IMPRESSION: Continued with pre-hab for L THA. Pt reports that pulmonology has cleared pt as surgical candidate. Tolerates increased resistance with bed level therex at this day. Limited time in standing and weight bearing for strengthening due to reported increased in pain following PT treatment on 3/17. Pt will continue to benefit from skilled physical therapy intervention to address impairments, improve QOL, and attain therapy goals.    OBJECTIVE IMPAIRMENTS: Abnormal gait, decreased activity tolerance, decreased balance, decreased endurance, decreased knowledge of condition, decreased knowledge of use of DME, decreased mobility, difficulty walking, decreased ROM, decreased strength, impaired perceived functional ability, impaired flexibility, impaired sensation, impaired UE functional use, and improper body mechanics.   ACTIVITY LIMITATIONS: stairs, transfers, and locomotion level  PARTICIPATION LIMITATIONS: cleaning, laundry, community activity, and yard work  PERSONAL FACTORS: 1-2 comorbidities: moderately severe arthropathy   are also affecting patient's functional outcome.   REHAB POTENTIAL: Good  CLINICAL DECISION MAKING: Stable/uncomplicated  EVALUATION COMPLEXITY: Moderate  PLAN:  PT FREQUENCY: 1-2x/week  PT DURATION: 12 weeks  PLANNED INTERVENTIONS: 97110-Therapeutic exercises, 97530- Therapeutic activity, 97112- Neuromuscular re-education, 97535- Self Care, 78295- Manual therapy, [phone]- Gait training, [phone]- Orthotic Fit/training, [phone]- Aquatic Therapy, [phone]- Splinting, Balance training, Stair training, Dry Needling, Joint mobilization, Joint manipulation, Compression bandaging, DME instructions, Cryotherapy, and Moist heat  PLAN FOR NEXT SESSION:   Continues BLE strength, dynamic balance  Golden Pop PT ,DPT Physical Therapist- [place]  [facility]

## 2023-06-22 ENCOUNTER — Ambulatory Visit: Payer: Medicare Other | Admitting: Physical Therapy

## 2023-06-22 DIAGNOSIS — R269 Unspecified abnormalities of gait and mobility: Secondary | ICD-10-CM | POA: Diagnosis not present

## 2023-06-22 DIAGNOSIS — M6281 Muscle weakness (generalized): Secondary | ICD-10-CM

## 2023-06-22 DIAGNOSIS — R2681 Unsteadiness on feet: Secondary | ICD-10-CM

## 2023-06-22 NOTE — Therapy (Unsigned)
 OUTPATIENT PHYSICAL THERAPY TREATMENT    Patient Name: Todd Farley MRN: [id number] DOB:[birth year], 78 y.o., male Today's Date: 06/22/2023   PCP: Gracelyn Nurse, MD  REFERRING PROVIDER: Lonell Face, MD   END OF SESSION:  PT End of Session - 06/22/23 1538     Visit Number 16    Number of Visits 24    Date for PT Re-Evaluation 07/20/23    Authorization Type Medicare A&B; AARP secondary    Progress Note Due on Visit 20    PT Start Time 1538    PT Stop Time 1615    PT Time Calculation (min) 37 min    Equipment Utilized During Treatment Gait belt    Activity Tolerance Patient tolerated treatment well;No increased pain    Behavior During Therapy WFL for tasks assessed/performed                 Past Medical History:  Diagnosis Date   Actinic keratosis    Anemia    Arthritis    Bronchial stenosis, right    Cancer (HCC)    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   Diabetes mellitus without complication (HCC)    GERD (gastroesophageal reflux disease)    High cholesterol    History of kidney stones    Hypertension    Neuropathy    Umbilical hernia    Past Surgical History:  Procedure Laterality Date   BRONCHIAL WASHINGS N/A 01/16/2023   Procedure: BRONCHIAL WASHINGS;  Surgeon: [name], MD;  Location: [facility] ORS;  Service: Thoracic;  Laterality: N/A;   CARPAL TUNNEL RELEASE Right    COLONOSCOPY WITH PROPOFOL N/A 06/25/2015   Procedure: COLONOSCOPY WITH PROPOFOL;  Surgeon: Scot Jun, MD;  Location: [facility] ENDOSCOPY;  Service: Endoscopy;  Laterality: N/A;   ESOPHAGOGASTRODUODENOSCOPY (EGD) WITH PROPOFOL N/A 06/25/2015   Procedure: ESOPHAGOGASTRODUODENOSCOPY (EGD) WITH PROPOFOL;  Surgeon: Scot Jun, MD;  Location: [facility] ENDOSCOPY;  Service: Endoscopy;  Laterality: N/A;   EYE SURGERY     eyelids x2   EYE SURGERY     cataract x 2 - intraocular implants   FLEXIBLE BRONCHOSCOPY N/A 01/16/2023   Procedure: FLEXIBLE BRONCHOSCOPY;  Surgeon: [name], MD;   Location: [facility] ORS;  Service: Thoracic;  Laterality: N/A;   HERNIA REPAIR     JOINT REPLACEMENT Right    knee   KNEE ARTHROSCOPY Right    acl repair   Patient Active Problem List   Diagnosis Date Noted   Umbilical hernia without obstruction and without gangrene 01/26/2018   Diabetic polyneuropathy associated with type 2 diabetes mellitus (HCC) 10/13/2017   GERD (gastroesophageal reflux disease) 04/13/2015   Diabetes mellitus type 2, uncomplicated (HCC) 03/14/2014   Hypertension 03/14/2014    ONSET DATE: >1 year  REFERRING DIAG:  Diagnosis  R26.89 (ICD-10-CM) - Imbalance    THERAPY DIAG:  Abnormality of gait and mobility  Unsteadiness on feet  Muscle weakness (generalized)  Rationale for Evaluation and Treatment: Rehabilitation  SUBJECTIVE:  SUBJECTIVE STATEMENT: Doing well in general. In a little more pain today due to weather and activity over the weekend. States that he feels like his hip "comes out of socket" some times.     PERTINENT HISTORY:  Recent MD appointment for hip pain.   "Left hip pain. He has known osteoarthritis. Is been getting worse. He did have PET scan done just recently which had an incidental finding of moderately severe arthropathy in the left hip. Have made referral to orthopedics for further workup and treatment. " From Neurology:  "Patient states his neuropathy symptoms are worse. Increased numbness in his feet. At times is he is not paying attention he does not known where his feet are. His balance is worse. He has had two falls since his last visit. Using a cane to ambulate now. He was seen at [facility] Room on 09/09/2022 after a fall. This fall was mechanical. He was wheeling his garbage cans to the curb early in the morning on minimal sleep.  Prior to this, had a mechanical fall over a terrain change. Has difficulty with prolonged standing secondary to worsening imbalance and pain (left > right). Denies fasciculations, cramping, myalgia, foot drop. "  PAIN:  Are you having pain? 3-4/10   PRECAUTIONS: Fall  RED FLAGS: None   WEIGHT BEARING RESTRICTIONS: No  FALLS: Has patient fallen in last 6 months? No and multiple near falls   PATIENT GOALS: improve balance, strength, endurance   OBJECTIVE:  Note: Objective measures were completed at Evaluation unless otherwise noted.  DIAGNOSTIC FINDINGS:  EXAM: NUCLEAR MEDICINE PET SKULL BASE TO THIGH IMPRESSION: 1. No findings suspicious for thoracic malignancy. No definite residual wall thickening or hypermetabolic activity within the proximal right lower lobe bronchus. Correlate with prior bronchoscopy results and consider follow-up chest CT in 3-6 months, as clinically warranted. 2. No suspicious metabolic activity identified within the neck, abdomen or pelvis. 3. Healing fracture of the right 4th rib anteriorly with low level metabolic activity. 4. Hepatic steatosis and colonic diverticulosis.  COGNITION: Overall cognitive status: Within functional limits for tasks assessed   SENSATION: Hx of diabetic neuropathy in bil ft and ankles. Medial worse than lateral    COORDINATION: Grossly WFL, but mild foot drop on the RLE.   EDEMA:  Mild bil distal edema  POSTURE:  forward head and posterior pelvic tilt  LOWER EXTREMITY ROM:    Grossly WFL   LOWER EXTREMITY MMT:    MMT Right Eval Left Eval  Hip flexion 4 4-  Hip extension    Hip abduction 4+ 4+  Hip adduction 4+ 4  Hip internal rotation    Hip external rotation    Knee flexion 4+ 4-  Knee extension 5 4-  Ankle dorsiflexion 4 4-  Ankle plantarflexion    Ankle inversion    Ankle eversion    (Blank rows = not tested)  TRANSFERS: Assistive device utilized: None  Sit to stand: Modified  independence Stand to sit: Complete Independence and Modified independence Chair to chair: Modified independence Floor:  to be assessed. Required assist with recovery from fall > 6 months ago   RAMP:  Level of Assistance: Modified independence Assistive device utilized: Single point cane Ramp Comments:  CURB:  Level of Assistance: Modified independence Assistive device utilized:  rail  Curb Comments:   STAIRS: Level of Assistance: Modified independence Stair Negotiation Technique: Step to Pattern Alternating Pattern  with Bilateral Rails Number of Stairs: 4  Height of Stairs: 6  Comments: step to ascent, step through descent   GAIT: Gait pattern:  excessive hip ER and ankle eversion on the RLE, decreased ankle dorsiflexion- Left, Right foot flat, Left foot flat, lateral hip instability, wide BOS, and poor foot clearance- Left Distance walked: 60 Assistive device utilized: Single point cane and None Level of assistance: Modified independence Comments: noted to have lateral hip instability with increased time in standing   FUNCTIONAL TESTS:  5 times sit to stand: 15.5 sec  Timed up and go (TUG): 21.54 6 minute walk test: TBD 10 meter walk test: 15.2sec  Berg Balance Scale: TBD   PATIENT SURVEYS:  ABC scale 77.5% 3/3 75%                                                                                                                             TREATMENT DATE: 06/22/23 -AA/ROM Nustep seat 11, arms 11, level 4x6 minutes (average SPM ~80s)   Supine therex SAQ 2 x 15 sidelying clam shell . 2 x 15 bil  Pelvic rock x 10  Sidelying hip flexor stretch 2 x 40 sec bil.  Quad/glute set 2 x 15 bil  Hip abduction GTB x 15 Heel slide x 15   LLE Distraction in R sidelying 2 x 40 sec   Pt reports significant decrease in pain on the LLE upon completion of distraction of hip flexor stretching.   Cues for full ROM in pain free range. Decreased pain reported with therex on this day  compared to prior session.   Standing on airex pad:  Ball raise over head x 15  Lateral trunk rotation to tap ball on target x x15 il  Foot tap to airex pad x 10 bil   No overt LOB while on airex pad, but min cues for posture and use of ankle strategy to control a/p LOB.   PATIENT EDUCATION: Education details: POC. Benefits of dynamic balance training. Hip joint anatomy.   Person educated: Patient Education method: Explanation Education comprehension: verbalized understanding  HOME EXERCISE PROGRAM: Access Code: [email or web address] URL: [email or web address] Date: 04/30/2023 Prepared by: Thresa Ross  Exercises - Small Range Straight Leg Raise  - 1 x daily - 7 x weekly - 3 sets - 10 reps - Supine Bridge  - 3-4 x weekly - 3 sets - 10 reps - Seated Long Arc Quad  - 2-3 x daily - 7 x weekly - 3 sets - 10 reps - Seated Ankle Pumps  - 1 x daily - 7 x weekly - 3 sets - 10 reps - Seated March  - 1 x daily - 7 x weekly - 3 sets - 10 reps - Standing March with Counter Support  - 1 x daily - 7 x weekly - 3 sets - 8 reps - Sit to Stand with Counter Support  - 1 x daily - 7 x weekly - 3 sets - 5 reps   GOALS: Goals reviewed with patient? Yes  SHORT  TERM GOALS: Target date: 05/26/2023 Patient will be independent in home exercise program to improve strength/mobility for better functional independence with ADLs. Baseline: provided 1/30 Goal status: IN PROGRESS  LONG TERM GOALS: Target date: 07/21/2023  Patient will increase ABC score by equal to or greater than  8%   to demonstrate statistically significant improvement in mobility and quality of life.  Baseline: 77.5  3/3:75 Goal status: IN PROGRESS  2.  Patient (> 8 years old) will complete five times sit to stand test in < 15 seconds indicating an increased LE strength and improved balance Baseline:15.5sec 06/01/23: 14.71 Goal status: IN PROGRESS  3.  Patient will increase Berg Balance score by > 6 points to demonstrate  decreased fall risk during functional activities Baseline: 41 06/01/23: 46 Goal status: IN PROGRESS  4.  Patient will increase 10 meter walk test to >1.40m/s as to improve gait speed for better community ambulation and to reduce fall risk. Baseline: 0.18m/s(15.2 sec)  3/3: 0.66m/s  Goal status: IN PROGRESS  5.  Patient will reduce timed up and go to <11 seconds to reduce fall risk and demonstrate improved transfer/gait ability. Baseline: 21.54sec 3/3: 14.24 sec avg.  Goal status: IN PROGRESS  ASSESSMENT: CLINICAL IMPRESSION: Continued with pre-hab for L THA. Pt reports that pulmonology has cleared pt as surgical candidate. Significantly decreased pain on the L hip upon completion of PT treatment. Tolerated all therex well on this day at bed level and dynamic balance training on unlevel surface.  Pt will continue to benefit from skilled physical therapy intervention to address impairments, improve QOL, and attain therapy goals.    OBJECTIVE IMPAIRMENTS: Abnormal gait, decreased activity tolerance, decreased balance, decreased endurance, decreased knowledge of condition, decreased knowledge of use of DME, decreased mobility, difficulty walking, decreased ROM, decreased strength, impaired perceived functional ability, impaired flexibility, impaired sensation, impaired UE functional use, and improper body mechanics.   ACTIVITY LIMITATIONS: stairs, transfers, and locomotion level  PARTICIPATION LIMITATIONS: cleaning, laundry, community activity, and yard work  PERSONAL FACTORS: 1-2 comorbidities: moderately severe arthropathy   are also affecting patient's functional outcome.   REHAB POTENTIAL: Good  CLINICAL DECISION MAKING: Stable/uncomplicated  EVALUATION COMPLEXITY: Moderate  PLAN:  PT FREQUENCY: 1-2x/week  PT DURATION: 12 weeks  PLANNED INTERVENTIONS: 97110-Therapeutic exercises, 97530- Therapeutic activity, 97112- Neuromuscular re-education, 97535- Self Care, 72536- Manual  therapy, [phone]- Gait training, [phone]- Orthotic Fit/training, [phone]- Aquatic Therapy, [phone]- Splinting, Balance training, Stair training, Dry Needling, Joint mobilization, Joint manipulation, Compression bandaging, DME instructions, Cryotherapy, and Moist heat  PLAN FOR NEXT SESSION:   Continues BLE strength, dynamic balance  Golden Pop PT ,DPT Physical Therapist- [place]  [facility]

## 2023-06-25 ENCOUNTER — Ambulatory Visit: Payer: Medicare Other | Admitting: Physical Therapy

## 2023-06-25 DIAGNOSIS — R269 Unspecified abnormalities of gait and mobility: Secondary | ICD-10-CM

## 2023-06-25 DIAGNOSIS — M6281 Muscle weakness (generalized): Secondary | ICD-10-CM

## 2023-06-25 DIAGNOSIS — R2681 Unsteadiness on feet: Secondary | ICD-10-CM

## 2023-06-25 NOTE — Therapy (Signed)
 OUTPATIENT PHYSICAL THERAPY TREATMENT    Patient Name: Todd Farley MRN: [id number] DOB:05-Dec-1945, 78 y.o., male Today's Date: 06/25/2023   PCP: Gracelyn Nurse, MD  REFERRING PROVIDER: Lonell Face, MD   END OF SESSION:  PT End of Session - 06/25/23 1500     Visit Number 17    Number of Visits 24    Date for PT Re-Evaluation 07/20/23    Authorization Type Medicare A&B; AARP secondary    Progress Note Due on Visit 20    PT Start Time 1450    PT Stop Time 1530    PT Time Calculation (min) 40 min    Equipment Utilized During Treatment Gait belt    Activity Tolerance Patient tolerated treatment well;No increased pain    Behavior During Therapy WFL for tasks assessed/performed                 Past Medical History:  Diagnosis Date   Actinic keratosis    Anemia    Arthritis    Bronchial stenosis, right    Cancer (HCC)    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   Diabetes mellitus without complication (HCC)    GERD (gastroesophageal reflux disease)    High cholesterol    History of kidney stones    Hypertension    Neuropathy    Umbilical hernia    Past Surgical History:  Procedure Laterality Date   BRONCHIAL WASHINGS N/A 01/16/2023   Procedure: BRONCHIAL WASHINGS;  Surgeon: [name], MD;  Location: [facility] ORS;  Service: Thoracic;  Laterality: N/A;   CARPAL TUNNEL RELEASE Right    COLONOSCOPY WITH PROPOFOL N/A 06/25/2015   Procedure: COLONOSCOPY WITH PROPOFOL;  Surgeon: Scot Jun, MD;  Location: [facility] ENDOSCOPY;  Service: Endoscopy;  Laterality: N/A;   ESOPHAGOGASTRODUODENOSCOPY (EGD) WITH PROPOFOL N/A 06/25/2015   Procedure: ESOPHAGOGASTRODUODENOSCOPY (EGD) WITH PROPOFOL;  Surgeon: Scot Jun, MD;  Location: [facility] ENDOSCOPY;  Service: Endoscopy;  Laterality: N/A;   EYE SURGERY     eyelids x2   EYE SURGERY     cataract x 2 - intraocular implants   FLEXIBLE BRONCHOSCOPY N/A 01/16/2023   Procedure: FLEXIBLE BRONCHOSCOPY;  Surgeon: [name], MD;   Location: [facility] ORS;  Service: Thoracic;  Laterality: N/A;   HERNIA REPAIR     JOINT REPLACEMENT Right    knee   KNEE ARTHROSCOPY Right    acl repair   Patient Active Problem List   Diagnosis Date Noted   Umbilical hernia without obstruction and without gangrene 01/26/2018   Diabetic polyneuropathy associated with type 2 diabetes mellitus (HCC) 10/13/2017   GERD (gastroesophageal reflux disease) 04/13/2015   Diabetes mellitus type 2, uncomplicated (HCC) 03/14/2014   Hypertension 03/14/2014    ONSET DATE: >1 year  REFERRING DIAG:  Diagnosis  R26.89 (ICD-10-CM) - Imbalance    THERAPY DIAG:  Unsteadiness on feet  Abnormality of gait and mobility  Muscle weakness (generalized)  Rationale for Evaluation and Treatment: Rehabilitation  SUBJECTIVE:  SUBJECTIVE STATEMENT: Doing well in general. States that L hip was feeling much better for a few days, but now it is "letting me know its there"      PERTINENT HISTORY:  Recent MD appointment for hip pain.   "Left hip pain. He has known osteoarthritis. Is been getting worse. He did have PET scan done just recently which had an incidental finding of moderately severe arthropathy in the left hip. Have made referral to orthopedics for further workup and treatment. " From Neurology:  "Patient states his neuropathy symptoms are worse. Increased numbness in his feet. At times is he is not paying attention he does not known where his feet are. His balance is worse. He has had two falls since his last visit. Using a cane to ambulate now. He was seen at [facility] Room on 09/09/2022 after a fall. This fall was mechanical. He was wheeling his garbage cans to the curb early in the morning on minimal sleep. Prior to this, had a mechanical fall over  a terrain change. Has difficulty with prolonged standing secondary to worsening imbalance and pain (left > right). Denies fasciculations, cramping, myalgia, foot drop. "  PAIN:  Are you having pain? 3-4/10   PRECAUTIONS: Fall  RED FLAGS: None   WEIGHT BEARING RESTRICTIONS: No  FALLS: Has patient fallen in last 6 months? No and multiple near falls   PATIENT GOALS: improve balance, strength, endurance   OBJECTIVE:  Note: Objective measures were completed at Evaluation unless otherwise noted.  DIAGNOSTIC FINDINGS:  EXAM: NUCLEAR MEDICINE PET SKULL BASE TO THIGH IMPRESSION: 1. No findings suspicious for thoracic malignancy. No definite residual wall thickening or hypermetabolic activity within the proximal right lower lobe bronchus. Correlate with prior bronchoscopy results and consider follow-up chest CT in 3-6 months, as clinically warranted. 2. No suspicious metabolic activity identified within the neck, abdomen or pelvis. 3. Healing fracture of the right 4th rib anteriorly with low level metabolic activity. 4. Hepatic steatosis and colonic diverticulosis.  COGNITION: Overall cognitive status: Within functional limits for tasks assessed   SENSATION: Hx of diabetic neuropathy in bil ft and ankles. Medial worse than lateral    COORDINATION: Grossly WFL, but mild foot drop on the RLE.   EDEMA:  Mild bil distal edema  POSTURE:  forward head and posterior pelvic tilt  LOWER EXTREMITY ROM:    Grossly WFL   LOWER EXTREMITY MMT:    MMT Right Eval Left Eval  Hip flexion 4 4-  Hip extension    Hip abduction 4+ 4+  Hip adduction 4+ 4  Hip internal rotation    Hip external rotation    Knee flexion 4+ 4-  Knee extension 5 4-  Ankle dorsiflexion 4 4-  Ankle plantarflexion    Ankle inversion    Ankle eversion    (Blank rows = not tested)  TRANSFERS: Assistive device utilized: None  Sit to stand: Modified independence Stand to sit: Complete Independence and  Modified independence Chair to chair: Modified independence Floor:  to be assessed. Required assist with recovery from fall > 6 months ago   RAMP:  Level of Assistance: Modified independence Assistive device utilized: Single point cane Ramp Comments:  CURB:  Level of Assistance: Modified independence Assistive device utilized:  rail  Curb Comments:   STAIRS: Level of Assistance: Modified independence Stair Negotiation Technique: Step to Pattern Alternating Pattern  with Bilateral Rails Number of Stairs: 4  Height of Stairs: 6  Comments: step to ascent,  step through descent   GAIT: Gait pattern:  excessive hip ER and ankle eversion on the RLE, decreased ankle dorsiflexion- Left, Right foot flat, Left foot flat, lateral hip instability, wide BOS, and poor foot clearance- Left Distance walked: 60 Assistive device utilized: Single point cane and None Level of assistance: Modified independence Comments: noted to have lateral hip instability with increased time in standing   FUNCTIONAL TESTS:  5 times sit to stand: 15.5 sec  Timed up and go (TUG): 21.54 6 minute walk test: TBD 10 meter walk test: 15.2sec  Berg Balance Scale: TBD   PATIENT SURVEYS:  ABC scale 77.5% 3/3 75%                                                                                                                             TREATMENT DATE: 06/25/23 Nustep reciprocal endurance training level 1-4 x 6 min cues for full ROM.   Standing hip flexor stretch 2 x 20 sec bil with 1 LE on 3 inch airex pad.  Standing hip extension x 10 bil with reports of mild hip pain  on the R side.  Standing on airex pad:  Normal BOS 2 x 40 sec.  Cross body reach to tap target 2x 12 bil   Sit<>stand x 8 with UE support to push up from arm rest.  Seated LAQ x 12 bil 4# AW.  Ankle PF BTB 2 x 15 bil  Hip abduction GTB in sitting 2x 10 bil  LLE  Distraction in R sidelying 2 x 40 sec  L hip grade 2 lateral joint mobs 2 x 40 sec   L hip flexor stretch 2 x 30 sec bil   Pt reports mild decrease in pain in the L hip upon completion.   No overt LOB while on airex pad, but min cues for posture and use of ankle strategy to control a/p LOB.   PATIENT EDUCATION: Education details: POC. Benefits of dynamic balance training. Hip joint anatomy. Pt educated throughout session about proper posture and technique with exercises. Improved exercise technique, movement at target joints, use of target muscles after min to mod verbal, visual, tactile cues.   Person educated: Patient Education method: Explanation Education comprehension: verbalized understanding  HOME EXERCISE PROGRAM: Access Code: [email or web address] URL: [email or web address] Date: 04/30/2023 Prepared by: Thresa Ross  Exercises - Small Range Straight Leg Raise  - 1 x daily - 7 x weekly - 3 sets - 10 reps - Supine Bridge  - 3-4 x weekly - 3 sets - 10 reps - Seated Long Arc Quad  - 2-3 x daily - 7 x weekly - 3 sets - 10 reps - Seated Ankle Pumps  - 1 x daily - 7 x weekly - 3 sets - 10 reps - Seated March  - 1 x daily - 7 x weekly - 3 sets - 10 reps - Standing March with Counter Support  - 1 x daily - 7 x weekly -  3 sets - 8 reps - Sit to Stand with Counter Support  - 1 x daily - 7 x weekly - 3 sets - 5 reps   GOALS: Goals reviewed with patient? Yes  SHORT TERM GOALS: Target date: 05/26/2023 Patient will be independent in home exercise program to improve strength/mobility for better functional independence with ADLs. Baseline: provided 1/30 Goal status: IN PROGRESS  LONG TERM GOALS: Target date: 07/21/2023  Patient will increase ABC score by equal to or greater than  8%   to demonstrate statistically significant improvement in mobility and quality of life.  Baseline: 77.5  3/3:75 Goal status: IN PROGRESS  2.  Patient (> 27 years old) will complete five times sit to stand test in < 15 seconds indicating an increased LE strength and improved  balance Baseline:15.5sec 06/01/23: 14.71 Goal status: IN PROGRESS  3.  Patient will increase Berg Balance score by > 6 points to demonstrate decreased fall risk during functional activities Baseline: 41 06/01/23: 46 Goal status: IN PROGRESS  4.  Patient will increase 10 meter walk test to >1.39m/s as to improve gait speed for better community ambulation and to reduce fall risk. Baseline: 0.34m/s(15.2 sec)  3/3: 0.49m/s  Goal status: IN PROGRESS  5.  Patient will reduce timed up and go to <11 seconds to reduce fall risk and demonstrate improved transfer/gait ability. Baseline: 21.54sec 3/3: 14.24 sec avg.  Goal status: IN PROGRESS  ASSESSMENT: CLINICAL IMPRESSION: Continued with pre-hab for L THA. Pt reports that pulmonology has cleared pt as surgical candidate. Will have Chest CT next week.  PT treatment focused on improve hip ROM, pain management and improved BLE strength with expectation of THA in the near future. Pt will continue to benefit from skilled physical therapy intervention to address impairments, improve QOL, and attain therapy goals.    OBJECTIVE IMPAIRMENTS: Abnormal gait, decreased activity tolerance, decreased balance, decreased endurance, decreased knowledge of condition, decreased knowledge of use of DME, decreased mobility, difficulty walking, decreased ROM, decreased strength, impaired perceived functional ability, impaired flexibility, impaired sensation, impaired UE functional use, and improper body mechanics.   ACTIVITY LIMITATIONS: stairs, transfers, and locomotion level  PARTICIPATION LIMITATIONS: cleaning, laundry, community activity, and yard work  PERSONAL FACTORS: 1-2 comorbidities: moderately severe arthropathy   are also affecting patient's functional outcome.   REHAB POTENTIAL: Good  CLINICAL DECISION MAKING: Stable/uncomplicated  EVALUATION COMPLEXITY: Moderate  PLAN:  PT FREQUENCY: 1-2x/week  PT DURATION: 12 weeks  PLANNED INTERVENTIONS:  97110-Therapeutic exercises, 97530- Therapeutic activity, 97112- Neuromuscular re-education, 97535- Self Care, 16109- Manual therapy, [phone]- Gait training, [phone]- Orthotic Fit/training, [phone]- Aquatic Therapy, [phone]- Splinting, Balance training, Stair training, Dry Needling, Joint mobilization, Joint manipulation, Compression bandaging, DME instructions, Cryotherapy, and Moist heat  PLAN FOR NEXT SESSION:   Continues BLE strength, dynamic balance  Golden Pop PT ,DPT Physical Therapist- [place]  [facility]

## 2023-06-29 ENCOUNTER — Ambulatory Visit: Payer: Medicare Other | Admitting: Physical Therapy

## 2023-06-29 DIAGNOSIS — R269 Unspecified abnormalities of gait and mobility: Secondary | ICD-10-CM | POA: Diagnosis not present

## 2023-06-29 DIAGNOSIS — R2681 Unsteadiness on feet: Secondary | ICD-10-CM

## 2023-06-29 DIAGNOSIS — M6281 Muscle weakness (generalized): Secondary | ICD-10-CM

## 2023-06-29 NOTE — Therapy (Signed)
 OUTPATIENT PHYSICAL THERAPY TREATMENT    Patient Name: Todd Farley MRN: [id number] DOB:[birth year], 78 y.o., male Today's Date: 06/29/2023   PCP: Gracelyn Nurse, MD  REFERRING PROVIDER: Lonell Face, MD   END OF SESSION:  PT End of Session - 06/29/23 1522     Visit Number 18    Number of Visits 24    Date for PT Re-Evaluation 07/20/23    Authorization Type Medicare A&B; [employer] secondary    Progress Note Due on Visit 20    PT Start Time 1532    PT Stop Time 1612    PT Time Calculation (min) 40 min    Equipment Utilized During Treatment Gait belt    Activity Tolerance Patient tolerated treatment well;No increased pain    Behavior During Therapy WFL for tasks assessed/performed                 Past Medical History:  Diagnosis Date   Actinic keratosis    Anemia    Arthritis    Bronchial stenosis, right    Cancer (HCC)    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   Diabetes mellitus without complication (HCC)    GERD (gastroesophageal reflux disease)    High cholesterol    History of kidney stones    Hypertension    Neuropathy    Umbilical hernia    Past Surgical History:  Procedure Laterality Date   BRONCHIAL WASHINGS N/A 01/16/2023   Procedure: BRONCHIAL WASHINGS;  Surgeon: [name], MD;  Location: [facility] ORS;  Service: Thoracic;  Laterality: N/A;   CARPAL TUNNEL RELEASE Right    COLONOSCOPY WITH PROPOFOL N/A 06/25/2015   Procedure: COLONOSCOPY WITH PROPOFOL;  Surgeon: Scot Jun, MD;  Location: [facility] ENDOSCOPY;  Service: Endoscopy;  Laterality: N/A;   ESOPHAGOGASTRODUODENOSCOPY (EGD) WITH PROPOFOL N/A 06/25/2015   Procedure: ESOPHAGOGASTRODUODENOSCOPY (EGD) WITH PROPOFOL;  Surgeon: Scot Jun, MD;  Location: [facility] ENDOSCOPY;  Service: Endoscopy;  Laterality: N/A;   EYE SURGERY     eyelids x2   EYE SURGERY     cataract x 2 - intraocular implants   FLEXIBLE BRONCHOSCOPY N/A 01/16/2023   Procedure: FLEXIBLE BRONCHOSCOPY;  Surgeon: [name], MD;   Location: [facility] ORS;  Service: Thoracic;  Laterality: N/A;   HERNIA REPAIR     JOINT REPLACEMENT Right    knee   KNEE ARTHROSCOPY Right    acl repair   Patient Active Problem List   Diagnosis Date Noted   Umbilical hernia without obstruction and without gangrene 01/26/2018   Diabetic polyneuropathy associated with type 2 diabetes mellitus (HCC) 10/13/2017   GERD (gastroesophageal reflux disease) 04/13/2015   Diabetes mellitus type 2, uncomplicated (HCC) 03/14/2014   Hypertension 03/14/2014    ONSET DATE: >1 year  REFERRING DIAG:  Diagnosis  R26.89 (ICD-10-CM) - Imbalance    THERAPY DIAG:  Unsteadiness on feet  Abnormality of gait and mobility  Muscle weakness (generalized)  Rationale for Evaluation and Treatment: Rehabilitation  SUBJECTIVE:  SUBJECTIVE STATEMENT: Pt reports that he is doing well. Went to [place] over the weekend and saw a play and music performance. States that Legs were a little tired this morning  and   States that next follow-up with surgeon will by on 4/16 to schedule possible hip replacement.     PERTINENT HISTORY:  Recent MD appointment for hip pain.   "Left hip pain. He has known osteoarthritis. Is been getting worse. He did have PET scan done just recently which had an incidental finding of moderately severe arthropathy in the left hip. Have made referral to orthopedics for further workup and treatment. " From Neurology:  "Patient states his neuropathy symptoms are worse. Increased numbness in his feet. At times is he is not paying attention he does not known where his feet are. His balance is worse. He has had two falls since his last visit. Using a cane to ambulate now. He was seen at [facility] Room on 09/09/2022 after a fall. This fall was  mechanical. He was wheeling his garbage cans to the curb early in the morning on minimal sleep. Prior to this, had a mechanical fall over a terrain change. Has difficulty with prolonged standing secondary to worsening imbalance and pain (left > right). Denies fasciculations, cramping, myalgia, foot drop. "  PAIN:  Are you having pain? 3-4/10   PRECAUTIONS: Fall  RED FLAGS: None   WEIGHT BEARING RESTRICTIONS: No  FALLS: Has patient fallen in last 6 months? No and multiple near falls   PATIENT GOALS: improve balance, strength, endurance   OBJECTIVE:  Note: Objective measures were completed at Evaluation unless otherwise noted.  DIAGNOSTIC FINDINGS:  EXAM: NUCLEAR MEDICINE PET SKULL BASE TO THIGH IMPRESSION: 1. No findings suspicious for thoracic malignancy. No definite residual wall thickening or hypermetabolic activity within the proximal right lower lobe bronchus. Correlate with prior bronchoscopy results and consider follow-up chest CT in 3-6 months, as clinically warranted. 2. No suspicious metabolic activity identified within the neck, abdomen or pelvis. 3. Healing fracture of the right 4th rib anteriorly with low level metabolic activity. 4. Hepatic steatosis and colonic diverticulosis.  COGNITION: Overall cognitive status: Within functional limits for tasks assessed   SENSATION: Hx of diabetic neuropathy in bil ft and ankles. Medial worse than lateral    COORDINATION: Grossly WFL, but mild foot drop on the RLE.   EDEMA:  Mild bil distal edema  POSTURE:  forward head and posterior pelvic tilt  LOWER EXTREMITY ROM:    Grossly WFL   LOWER EXTREMITY MMT:    MMT Right Eval Left Eval  Hip flexion 4 4-  Hip extension    Hip abduction 4+ 4+  Hip adduction 4+ 4  Hip internal rotation    Hip external rotation    Knee flexion 4+ 4-  Knee extension 5 4-  Ankle dorsiflexion 4 4-  Ankle plantarflexion    Ankle inversion    Ankle eversion    (Blank rows =  not tested)  TRANSFERS: Assistive device utilized: None  Sit to stand: Modified independence Stand to sit: Complete Independence and Modified independence Chair to chair: Modified independence Floor:  to be assessed. Required assist with recovery from fall > 6 months ago   RAMP:  Level of Assistance: Modified independence Assistive device utilized: Single point cane Ramp Comments:  CURB:  Level of Assistance: Modified independence Assistive device utilized:  rail  Curb Comments:   STAIRS: Level of Assistance: Modified independence Stair Negotiation Technique: Step  to Pattern Alternating Pattern  with Bilateral Rails Number of Stairs: 4  Height of Stairs: 6  Comments: step to ascent, step through descent   GAIT: Gait pattern:  excessive hip ER and ankle eversion on the RLE, decreased ankle dorsiflexion- Left, Right foot flat, Left foot flat, lateral hip instability, wide BOS, and poor foot clearance- Left Distance walked: 60 Assistive device utilized: Single point cane and None Level of assistance: Modified independence Comments: noted to have lateral hip instability with increased time in standing   FUNCTIONAL TESTS:  5 times sit to stand: 15.5 sec  Timed up and go (TUG): 21.54 6 minute walk test: TBD 10 meter walk test: 15.2sec  Berg Balance Scale: TBD   PATIENT SURVEYS:  ABC scale 77.5% 3/3 75%                                                                                                                             TREATMENT DATE: 06/29/23 Nustep reciprocal endurance training level 1-4 x 6 min cues for full ROM.   Standing on airex pad:  Eyes closed 15 sec, eyes open 30 sec x 4 bouts.  Foot tap on airex pad x 12 bil   BLE stepping over SPC in floor x 10 bil  Single limb reciprocal stepping over cane x 12 bil  Side stepping over cane with 1 LE x 12 bil   CGA for safety. No LOB noted on airex pad or stepping over cane.   Manual  Grade 2-3 lateral hip  mobilization hip 2 x 45 sec bil.  HIp IR/ER rhythmic stretching on BLE 2x 40 sec. Bil   Hip flexor stretch off EOB 2 x 40 sec LLE.  BLE  Distraction in R sidelying 2 x 40 sec  L hip grade 2 lateral joint mobs 2 x 40 sec  L hip flexor stretch 2 x 30 sec bil   Pt reports mild decrease in pain in the L hip upon completion of manual therapy.    PATIENT EDUCATION: Education details: POC. Benefits of dynamic balance training. Hip joint anatomy. Pt educated throughout session about proper posture and technique with exercises. Improved exercise technique, movement at target joints, use of target muscles after min to mod verbal, visual, tactile cues.   Person educated: Patient Education method: Explanation Education comprehension: verbalized understanding  HOME EXERCISE PROGRAM: Access Code: [email or web address] URL: [email or web address] Date: 04/30/2023 Prepared by: Thresa Ross  Exercises - Small Range Straight Leg Raise  - 1 x daily - 7 x weekly - 3 sets - 10 reps - Supine Bridge  - 3-4 x weekly - 3 sets - 10 reps - Seated Long Arc Quad  - 2-3 x daily - 7 x weekly - 3 sets - 10 reps - Seated Ankle Pumps  - 1 x daily - 7 x weekly - 3 sets - 10 reps - Seated March  - 1 x daily - 7 x weekly - 3 sets -  10 reps - Standing March with Counter Support  - 1 x daily - 7 x weekly - 3 sets - 8 reps - Sit to Stand with Counter Support  - 1 x daily - 7 x weekly - 3 sets - 5 reps   GOALS: Goals reviewed with patient? Yes  SHORT TERM GOALS: Target date: 05/26/2023 Patient will be independent in home exercise program to improve strength/mobility for better functional independence with ADLs. Baseline: provided 1/30 Goal status: IN PROGRESS  LONG TERM GOALS: Target date: 07/21/2023  Patient will increase ABC score by equal to or greater than  8%   to demonstrate statistically significant improvement in mobility and quality of life.  Baseline: 77.5  3/3:75 Goal status: IN PROGRESS  2.   Patient (> 52 years old) will complete five times sit to stand test in < 15 seconds indicating an increased LE strength and improved balance Baseline:15.5sec 06/01/23: 14.71 Goal status: IN PROGRESS  3.  Patient will increase Berg Balance score by > 6 points to demonstrate decreased fall risk during functional activities Baseline: 41 06/01/23: 46 Goal status: IN PROGRESS  4.  Patient will increase 10 meter walk test to >1.58m/s as to improve gait speed for better community ambulation and to reduce fall risk. Baseline: 0.49m/s(15.2 sec)  3/3: 0.61m/s  Goal status: IN PROGRESS  5.  Patient will reduce timed up and go to <11 seconds to reduce fall risk and demonstrate improved transfer/gait ability. Baseline: 21.54sec 3/3: 14.24 sec avg.  Goal status: IN PROGRESS  ASSESSMENT: CLINICAL IMPRESSION: Continued with pre-hab for L THA. Pt reports that pulmonology has cleared pt as surgical candidate. Will have Chest CT this week as well as follow with orthopedic MD on 4/12.   PT treatment focused on improve hip ROM, pain management and improved BLE strength with expectation of THA in the near future. Continued to also address balance deficits on unlevel surface and improved step length as tolerated in Bil hips. Pt will continue to benefit from skilled physical therapy intervention to address impairments, improve QOL, and attain therapy goals.    OBJECTIVE IMPAIRMENTS: Abnormal gait, decreased activity tolerance, decreased balance, decreased endurance, decreased knowledge of condition, decreased knowledge of use of DME, decreased mobility, difficulty walking, decreased ROM, decreased strength, impaired perceived functional ability, impaired flexibility, impaired sensation, impaired UE functional use, and improper body mechanics.   ACTIVITY LIMITATIONS: stairs, transfers, and locomotion level  PARTICIPATION LIMITATIONS: cleaning, laundry, community activity, and yard work  PERSONAL FACTORS: 1-2  comorbidities: moderately severe arthropathy   are also affecting patient's functional outcome.   REHAB POTENTIAL: Good  CLINICAL DECISION MAKING: Stable/uncomplicated  EVALUATION COMPLEXITY: Moderate  PLAN:  PT FREQUENCY: 1-2x/week  PT DURATION: 12 weeks  PLANNED INTERVENTIONS: 97110-Therapeutic exercises, 97530- Therapeutic activity, [id number]- Neuromuscular re-education, 97535- Self Care, 16109- Manual therapy, [id number]- Gait training, [phone]- Orthotic Fit/training, [phone]- Aquatic Therapy, [phone]- Splinting, Balance training, Stair training, Dry Needling, Joint mobilization, Joint manipulation, Compression bandaging, DME instructions, Cryotherapy, and Moist heat  PLAN FOR NEXT SESSION:   Continues BLE strength, dynamic balance Pain management with manual therapy.   Golden Pop PT ,DPT Physical Therapist- [place]  [facility]

## 2023-07-02 ENCOUNTER — Ambulatory Visit
Admission: RE | Admit: 2023-07-02 | Discharge: 2023-07-02 | Disposition: A | Discharge status: Home / Self Care | Source: Ambulatory Visit | Attending: Thoracic Surgery (Cardiothoracic Vascular Surgery) | Admitting: Thoracic Surgery (Cardiothoracic Vascular Surgery)

## 2023-07-02 ENCOUNTER — Ambulatory Visit: Payer: Medicare Other | Attending: Neurology | Admitting: Physical Therapy

## 2023-07-02 DIAGNOSIS — R2681 Unsteadiness on feet: Secondary | ICD-10-CM | POA: Insufficient documentation

## 2023-07-02 DIAGNOSIS — R911 Solitary pulmonary nodule: Secondary | ICD-10-CM

## 2023-07-02 DIAGNOSIS — M6281 Muscle weakness (generalized): Secondary | ICD-10-CM | POA: Diagnosis present

## 2023-07-02 DIAGNOSIS — R269 Unspecified abnormalities of gait and mobility: Secondary | ICD-10-CM | POA: Insufficient documentation

## 2023-07-02 MED ORDER — IOPAMIDOL (ISOVUE-300) INJECTION 61%
75.0000 mL | Freq: Once | INTRAVENOUS | Status: AC | PRN
  Administered 2023-07-02: 75 mL via INTRAVENOUS

## 2023-07-02 NOTE — Therapy (Signed)
 OUTPATIENT PHYSICAL THERAPY TREATMENT    Patient Name: Todd Farley MRN: [id number] DOB:[birth year], 78 y.o., male Today's Date: 07/02/2023   PCP: Gracelyn Nurse, MD  REFERRING PROVIDER: Lonell Face, MD   END OF SESSION:  PT End of Session - 07/02/23 1454     Visit Number 19    Number of Visits 24    PT Start Time 1450    PT Stop Time 1530    PT Time Calculation (min) 40 min    Activity Tolerance Patient tolerated treatment well;No increased pain    Behavior During Therapy WFL for tasks assessed/performed                 Past Medical History:  Diagnosis Date   Actinic keratosis    Anemia    Arthritis    Bronchial stenosis, right    Cancer (HCC)    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   Diabetes mellitus without complication (HCC)    GERD (gastroesophageal reflux disease)    High cholesterol    History of kidney stones    Hypertension    Neuropathy    Umbilical hernia    Past Surgical History:  Procedure Laterality Date   BRONCHIAL WASHINGS N/A 01/16/2023   Procedure: BRONCHIAL WASHINGS;  Surgeon: [name], MD;  Location: [facility] ORS;  Service: Thoracic;  Laterality: N/A;   CARPAL TUNNEL RELEASE Right    COLONOSCOPY WITH PROPOFOL N/A 06/25/2015   Procedure: COLONOSCOPY WITH PROPOFOL;  Surgeon: Scot Jun, MD;  Location: [facility] ENDOSCOPY;  Service: Endoscopy;  Laterality: N/A;   ESOPHAGOGASTRODUODENOSCOPY (EGD) WITH PROPOFOL N/A 06/25/2015   Procedure: ESOPHAGOGASTRODUODENOSCOPY (EGD) WITH PROPOFOL;  Surgeon: Scot Jun, MD;  Location: [facility] ENDOSCOPY;  Service: Endoscopy;  Laterality: N/A;   EYE SURGERY     eyelids x2   EYE SURGERY     cataract x 2 - intraocular implants   FLEXIBLE BRONCHOSCOPY N/A 01/16/2023   Procedure: FLEXIBLE BRONCHOSCOPY;  Surgeon: [name], MD;  Location: [facility] ORS;  Service: Thoracic;  Laterality: N/A;   HERNIA REPAIR     JOINT REPLACEMENT Right    knee   KNEE ARTHROSCOPY Right    acl repair   Patient Active  Problem List   Diagnosis Date Noted   Umbilical hernia without obstruction and without gangrene 01/26/2018   Diabetic polyneuropathy associated with type 2 diabetes mellitus (HCC) 10/13/2017   GERD (gastroesophageal reflux disease) 04/13/2015   Diabetes mellitus type 2, uncomplicated (HCC) 03/14/2014   Hypertension 03/14/2014    ONSET DATE: >1 year  REFERRING DIAG:  Diagnosis  R26.89 (ICD-10-CM) - Imbalance    THERAPY DIAG:  Unsteadiness on feet  Abnormality of gait and mobility  Muscle weakness (generalized)  Rationale for Evaluation and Treatment: Rehabilitation  SUBJECTIVE:  SUBJECTIVE STATEMENT: Pt reports that he is doing well. States that he is walking more with ankle weights at home to improve hip strength  and improve step length.   States that next follow-up with surgeon will by on 4/16 to schedule possible hip replacement.     PERTINENT HISTORY:  Recent MD appointment for hip pain.   "Left hip pain. He has known osteoarthritis. Is been getting worse. He did have PET scan done just recently which had an incidental finding of moderately severe arthropathy in the left hip. Have made referral to orthopedics for further workup and treatment. " From Neurology:  "Patient states his neuropathy symptoms are worse. Increased numbness in his feet. At times is he is not paying attention he does not known where his feet are. His balance is worse. He has had two falls since his last visit. Using a cane to ambulate now. He was seen at [facility] Room on 09/09/2022 after a fall. This fall was mechanical. He was wheeling his garbage cans to the curb early in the morning on minimal sleep. Prior to this, had a mechanical fall over a terrain change. Has difficulty with prolonged standing  secondary to worsening imbalance and pain (left > right). Denies fasciculations, cramping, myalgia, foot drop. "  PAIN:  Are you having pain? 3-4/10   PRECAUTIONS: Fall  RED FLAGS: None   WEIGHT BEARING RESTRICTIONS: No  FALLS: Has patient fallen in last 6 months? No and multiple near falls   PATIENT GOALS: improve balance, strength, endurance   OBJECTIVE:  Note: Objective measures were completed at Evaluation unless otherwise noted.  DIAGNOSTIC FINDINGS:  EXAM: NUCLEAR MEDICINE PET SKULL BASE TO THIGH IMPRESSION: 1. No findings suspicious for thoracic malignancy. No definite residual wall thickening or hypermetabolic activity within the proximal right lower lobe bronchus. Correlate with prior bronchoscopy results and consider follow-up chest CT in 3-6 months, as clinically warranted. 2. No suspicious metabolic activity identified within the neck, abdomen or pelvis. 3. Healing fracture of the right 4th rib anteriorly with low level metabolic activity. 4. Hepatic steatosis and colonic diverticulosis.  COGNITION: Overall cognitive status: Within functional limits for tasks assessed   SENSATION: Hx of diabetic neuropathy in bil ft and ankles. Medial worse than lateral    COORDINATION: Grossly WFL, but mild foot drop on the RLE.   EDEMA:  Mild bil distal edema  POSTURE:  forward head and posterior pelvic tilt  LOWER EXTREMITY ROM:    Grossly WFL   LOWER EXTREMITY MMT:    MMT Right Eval Left Eval  Hip flexion 4 4-  Hip extension    Hip abduction 4+ 4+  Hip adduction 4+ 4  Hip internal rotation    Hip external rotation    Knee flexion 4+ 4-  Knee extension 5 4-  Ankle dorsiflexion 4 4-  Ankle plantarflexion    Ankle inversion    Ankle eversion    (Blank rows = not tested)  TRANSFERS: Assistive device utilized: None  Sit to stand: Modified independence Stand to sit: Complete Independence and Modified independence Chair to chair: Modified  independence Floor:  to be assessed. Required assist with recovery from fall > 6 months ago   RAMP:  Level of Assistance: Modified independence Assistive device utilized: Single point cane Ramp Comments:  CURB:  Level of Assistance: Modified independence Assistive device utilized:  rail  Curb Comments:   STAIRS: Level of Assistance: Modified independence Stair Negotiation Technique: Step to Pattern Alternating Pattern  with Bilateral Rails Number of Stairs: 4  Height of Stairs: 6  Comments: step to ascent, step through descent   GAIT: Gait pattern:  excessive hip ER and ankle eversion on the RLE, decreased ankle dorsiflexion- Left, Right foot flat, Left foot flat, lateral hip instability, wide BOS, and poor foot clearance- Left Distance walked: 60 Assistive device utilized: Single point cane and None Level of assistance: Modified independence Comments: noted to have lateral hip instability with increased time in standing   FUNCTIONAL TESTS:  5 times sit to stand: 15.5 sec  Timed up and go (TUG): 21.54 6 minute walk test: TBD 10 meter walk test: 15.2sec  Berg Balance Scale: TBD   PATIENT SURVEYS:  ABC scale 77.5% 3/3 75%                                                                                                                             TREATMENT DATE: 07/02/23   Nustep reciprocal endurance training level 1-4 x 6 min cues for full ROM.    Seated therex: Hip abduction RTB 2x 15  Sit<>stand with RTB at knees an cues for constant pressure to engage glutes. 2X 8 Hip flexion/abduction with RTB 2x 16  Hip extension from flexed position 2x 15 GTB Ankle inversion to neutral x 20 bil  Ankle eversion x 10, stopped due to excessive tibial rotation  Ankle PF with ankles in neutral x 20   Standing therex.  Side stepping R and L with RTB x 5 laps on rail Hip extension with RTB x 12 bil cues for decreased trunkal and hip compensations.     PATIENT  EDUCATION: Education details: POC. Benefits of dynamic balance training. Hip joint anatomy. Pt educated throughout session about proper posture and technique with exercises. Improved exercise technique, movement at target joints, use of target muscles after min to mod verbal, visual, tactile cues.   Person educated: Patient Education method: Explanation Education comprehension: verbalized understanding  HOME EXERCISE PROGRAM: Access Code: [email or web address] URL: [email or web address] Date: 04/30/2023 Prepared by: Thresa Ross  Exercises - Small Range Straight Leg Raise  - 1 x daily - 7 x weekly - 3 sets - 10 reps - Supine Bridge  - 3-4 x weekly - 3 sets - 10 reps - Seated Long Arc Quad  - 2-3 x daily - 7 x weekly - 3 sets - 10 reps - Seated Ankle Pumps  - 1 x daily - 7 x weekly - 3 sets - 10 reps - Seated March  - 1 x daily - 7 x weekly - 3 sets - 10 reps - Standing March with Counter Support  - 1 x daily - 7 x weekly - 3 sets - 8 reps - Sit to Stand with Counter Support  - 1 x daily - 7 x weekly - 3 sets - 5 reps   GOALS: Goals reviewed with patient? Yes  SHORT TERM GOALS: Target date: 05/26/2023 Patient will be independent in  home exercise program to improve strength/mobility for better functional independence with ADLs. Baseline: provided 1/30 Goal status: IN PROGRESS  LONG TERM GOALS: Target date: 07/21/2023  Patient will increase ABC score by equal to or greater than  8%   to demonstrate statistically significant improvement in mobility and quality of life.  Baseline: 77.5  3/3:75 Goal status: IN PROGRESS  2.  Patient (> 70 years old) will complete five times sit to stand test in < 15 seconds indicating an increased LE strength and improved balance Baseline:15.5sec 06/01/23: 14.71 Goal status: IN PROGRESS  3.  Patient will increase Berg Balance score by > 6 points to demonstrate decreased fall risk during functional activities Baseline: 41 06/01/23: 46 Goal  status: IN PROGRESS  4.  Patient will increase 10 meter walk test to >1.72m/s as to improve gait speed for better community ambulation and to reduce fall risk. Baseline: 0.76m/s(15.2 sec)  3/3: 0.51m/s  Goal status: IN PROGRESS  5.  Patient will reduce timed up and go to <11 seconds to reduce fall risk and demonstrate improved transfer/gait ability. Baseline: 21.54sec 3/3: 14.24 sec avg.  Goal status: IN PROGRESS  ASSESSMENT: CLINICAL IMPRESSION: Continued with pre-hab for L THA. Pt reports that pulmonology has cleared pt as surgical candidate. Chest CT performed yesterday, as well as follow with orthopedic MD on 4/12.  PT treatment focused on  BLE strengthening and improved movement patterns to reduce hip pain. Pt reports decreased soreness at end of PT session, but states that he took tylenol and advil prior to PT treatment. Pt will continue to benefit from skilled physical therapy intervention to address impairments, improve QOL, and attain therapy goals.    OBJECTIVE IMPAIRMENTS: Abnormal gait, decreased activity tolerance, decreased balance, decreased endurance, decreased knowledge of condition, decreased knowledge of use of DME, decreased mobility, difficulty walking, decreased ROM, decreased strength, impaired perceived functional ability, impaired flexibility, impaired sensation, impaired UE functional use, and improper body mechanics.   ACTIVITY LIMITATIONS: stairs, transfers, and locomotion level  PARTICIPATION LIMITATIONS: cleaning, laundry, community activity, and yard work  PERSONAL FACTORS: 1-2 comorbidities: moderately severe arthropathy   are also affecting patient's functional outcome.   REHAB POTENTIAL: Good  CLINICAL DECISION MAKING: Stable/uncomplicated  EVALUATION COMPLEXITY: Moderate  PLAN:  PT FREQUENCY: 1-2x/week  PT DURATION: 12 weeks  PLANNED INTERVENTIONS: 97110-Therapeutic exercises, 97530- Therapeutic activity, [id number]- Neuromuscular re-education, 97535-  Self Care, [id number]- Manual therapy, [id number]- Gait training, [phone]- Orthotic Fit/training, [phone]- Aquatic Therapy, [phone]- Splinting, Balance training, Stair training, Dry Needling, Joint mobilization, Joint manipulation, Compression bandaging, DME instructions, Cryotherapy, and Moist heat  PLAN FOR NEXT SESSION:   Continues BLE strength, dynamic balance Pain management with manual therapy.   Golden Pop PT ,DPT Physical Therapist- [place]  [facility]

## 2023-07-06 ENCOUNTER — Ambulatory Visit: Payer: Medicare Other | Admitting: Physical Therapy

## 2023-07-06 DIAGNOSIS — R269 Unspecified abnormalities of gait and mobility: Secondary | ICD-10-CM

## 2023-07-06 DIAGNOSIS — M6281 Muscle weakness (generalized): Secondary | ICD-10-CM

## 2023-07-06 DIAGNOSIS — R2681 Unsteadiness on feet: Secondary | ICD-10-CM

## 2023-07-06 NOTE — Therapy (Unsigned)
 OUTPATIENT PHYSICAL THERAPY TREATMENT    Patient Name: Todd Farley MRN: [id number] DOB:[birth year], 78 y.o., male Today's Date: 07/06/2023   PCP: Gracelyn Nurse, MD  REFERRING PROVIDER: Lonell Face, MD   END OF SESSION:  PT End of Session - 07/06/23 1536     Visit Number 20    Number of Visits 24    PT Start Time 1533    PT Stop Time 1615    PT Time Calculation (min) 42 min    Activity Tolerance Patient tolerated treatment well;No increased pain    Behavior During Therapy WFL for tasks assessed/performed                 Past Medical History:  Diagnosis Date   Actinic keratosis    Anemia    Arthritis    Bronchial stenosis, right    Cancer (HCC)    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   Diabetes mellitus without complication (HCC)    GERD (gastroesophageal reflux disease)    High cholesterol    History of kidney stones    Hypertension    Neuropathy    Umbilical hernia    Past Surgical History:  Procedure Laterality Date   BRONCHIAL WASHINGS N/A 01/16/2023   Procedure: BRONCHIAL WASHINGS;  Surgeon: [name], MD;  Location: [facility] ORS;  Service: Thoracic;  Laterality: N/A;   CARPAL TUNNEL RELEASE Right    COLONOSCOPY WITH PROPOFOL N/A 06/25/2015   Procedure: COLONOSCOPY WITH PROPOFOL;  Surgeon: Scot Jun, MD;  Location: [facility] ENDOSCOPY;  Service: Endoscopy;  Laterality: N/A;   ESOPHAGOGASTRODUODENOSCOPY (EGD) WITH PROPOFOL N/A 06/25/2015   Procedure: ESOPHAGOGASTRODUODENOSCOPY (EGD) WITH PROPOFOL;  Surgeon: Scot Jun, MD;  Location: [facility] ENDOSCOPY;  Service: Endoscopy;  Laterality: N/A;   EYE SURGERY     eyelids x2   EYE SURGERY     cataract x 2 - intraocular implants   FLEXIBLE BRONCHOSCOPY N/A 01/16/2023   Procedure: FLEXIBLE BRONCHOSCOPY;  Surgeon: [name], MD;  Location: [facility] ORS;  Service: Thoracic;  Laterality: N/A;   HERNIA REPAIR     JOINT REPLACEMENT Right    knee   KNEE ARTHROSCOPY Right    acl repair   Patient Active  Problem List   Diagnosis Date Noted   Umbilical hernia without obstruction and without gangrene 01/26/2018   Diabetic polyneuropathy associated with type 2 diabetes mellitus (HCC) 10/13/2017   GERD (gastroesophageal reflux disease) 04/13/2015   Diabetes mellitus type 2, uncomplicated (HCC) 03/14/2014   Hypertension 03/14/2014    ONSET DATE: >1 year  REFERRING DIAG:  Diagnosis  R26.89 (ICD-10-CM) - Imbalance    THERAPY DIAG:  Unsteadiness on feet  Muscle weakness (generalized)  Abnormality of gait and mobility  Rationale for Evaluation and Treatment: Rehabilitation  SUBJECTIVE:  SUBJECTIVE STATEMENT:  Pt reports that he is doing well. No pain to report at of PT session. Pt states that he understands that continued progress is limited by hip pain and severity of arthritis. Agreeable to decrease to once per week and increase HEP.   States that next follow-up with surgeon will by on 4/16 to schedule possible hip replacement.     PERTINENT HISTORY:  Recent MD appointment for hip pain.   "Left hip pain. He has known osteoarthritis. Is been getting worse. He did have PET scan done just recently which had an incidental finding of moderately severe arthropathy in the left hip. Have made referral to orthopedics for further workup and treatment. " From Neurology:  "Patient states his neuropathy symptoms are worse. Increased numbness in his feet. At times is he is not paying attention he does not known where his feet are. His balance is worse. He has had two falls since his last visit. Using a cane to ambulate now. He was seen at [facility] on 09/09/2022 after a fall. This fall was mechanical. He was wheeling his garbage cans to the curb early in the morning on minimal sleep. Prior to  this, had a mechanical fall over a terrain change. Has difficulty with prolonged standing secondary to worsening imbalance and pain (left > right). Denies fasciculations, cramping, myalgia, foot drop. "  PAIN:  Are you having pain? 3-4/10   PRECAUTIONS: Fall  RED FLAGS: None   WEIGHT BEARING RESTRICTIONS: No  FALLS: Has patient fallen in last 6 months? No and multiple near falls   PATIENT GOALS: improve balance, strength, endurance   OBJECTIVE:  Note: Objective measures were completed at Evaluation unless otherwise noted.  DIAGNOSTIC FINDINGS:  EXAM: NUCLEAR MEDICINE PET SKULL BASE TO THIGH IMPRESSION: 1. No findings suspicious for thoracic malignancy. No definite residual wall thickening or hypermetabolic activity within the proximal right lower lobe bronchus. Correlate with prior bronchoscopy results and consider follow-up chest CT in 3-6 months, as clinically warranted. 2. No suspicious metabolic activity identified within the neck, abdomen or pelvis. 3. Healing fracture of the right 4th rib anteriorly with low level metabolic activity. 4. Hepatic steatosis and colonic diverticulosis.  COGNITION: Overall cognitive status: Within functional limits for tasks assessed   SENSATION: Hx of diabetic neuropathy in bil ft and ankles. Medial worse than lateral    COORDINATION: Grossly WFL, but mild foot drop on the RLE.   EDEMA:  Mild bil distal edema  POSTURE:  forward head and posterior pelvic tilt  LOWER EXTREMITY ROM:    Grossly WFL   LOWER EXTREMITY MMT:    MMT Right Eval Left Eval  Hip flexion 4 4-  Hip extension    Hip abduction 4+ 4+  Hip adduction 4+ 4  Hip internal rotation    Hip external rotation    Knee flexion 4+ 4-  Knee extension 5 4-  Ankle dorsiflexion 4 4-  Ankle plantarflexion    Ankle inversion    Ankle eversion    (Blank rows = not tested)  TRANSFERS: Assistive device utilized: None  Sit to stand: Modified independence Stand to  sit: Complete Independence and Modified independence Chair to chair: Modified independence Floor:  to be assessed. Required assist with recovery from fall > 6 months ago   RAMP:  Level of Assistance: Modified independence Assistive device utilized: Single point cane Ramp Comments:  CURB:  Level of Assistance: Modified independence Assistive device utilized:  rail  Curb Comments:  STAIRS: Level of Assistance: Modified independence Stair Negotiation Technique: Step to Pattern Alternating Pattern  with Bilateral Rails Number of Stairs: 4  Height of Stairs: 6  Comments: step to ascent, step through descent   GAIT: Gait pattern:  excessive hip ER and ankle eversion on the RLE, decreased ankle dorsiflexion- Left, Right foot flat, Left foot flat, lateral hip instability, wide BOS, and poor foot clearance- Left Distance walked: 60 Assistive device utilized: Single point cane and None Level of assistance: Modified independence Comments: noted to have lateral hip instability with increased time in standing   FUNCTIONAL TESTS:  5 times sit to stand: 15.5 sec  Timed up and go (TUG): 21.54 6 minute walk test: TBD 10 meter walk test: 15.2sec  Berg Balance Scale: TBD   PATIENT SURVEYS:  ABC scale 77.5% 3/3 75%                                                                                                                             TREATMENT DATE: 07/06/23   Pt performed 5 time sit<>stand (5xSTS):13.35sec(13.01, 13.76; >15 sec indicates increased fall risk)   Patient demonstrates increased fall risk as noted by score of   48/56 on Berg Balance Scale.  (<36= high risk for falls, close to 100%; 37-45 significant >80%; 46-51 moderate >50%; 52-55 lower >25%)  [facility] PT Assessment - 07/06/23 0001       Berg Balance Test   Sit to Stand Able to stand without using hands and stabilize independently    Standing Unsupported Able to stand safely 2 minutes    Sitting with Back Unsupported but  Feet Supported on Floor or Stool Able to sit safely and securely 2 minutes    Stand to Sit Sits safely with minimal use of hands    Transfers Able to transfer safely, minor use of hands    Standing Unsupported with Eyes Closed Able to stand 10 seconds safely    Standing Unsupported with Feet Together Able to place feet together independently and stand 1 minute safely    From Standing, Reach Forward with Outstretched Arm Can reach confidently >25 cm (10")    From Standing Position, Pick up Object from Floor Able to pick up shoe safely and easily    From Standing Position, Turn to Look Behind Over each Shoulder Looks behind one side only/other side shows less weight shift    Turn 360 Degrees Able to turn 360 degrees safely but slowly    Standing Unsupported, Alternately Place Feet on Step/Stool Able to stand independently and safely and complete 8 steps in 20 seconds    Standing Unsupported, One Foot in Front Able to take small step independently and hold 30 seconds    Standing on One Leg Tries to lift leg/unable to hold 3 seconds but remains standing independently    Total Score 48            PT instructed pt in TUG: 13.36 sec (  average of 3 trials; >13.5 sec indicates increased fall risk)  10 Meter Walk Test: Patient instructed to walk 10 meters (32.8 ft) as quickly and as safely as possible at their normal speed x2 and at a fast speed x2. Time measured from 2 meter mark to 8 meter mark to accommodate ramp-up and ramp-down.  Normal speed 1: 11.38 m/s Normal speed 2: 12.47 m/s Average Normal speed: 11.925sec (0.84 m/s)  Cut off scores: <0.4 m/s = household Ambulator, 0.4-0.8 m/s = limited community Ambulator, >0.8 m/s = community Ambulator, >1.2 m/s = crossing a street, <1.0 = increased fall risk MCID 0.05 m/s (small), 0.13 m/s (moderate), 0.06 m/s (significant)  (ANPTA Core Set of Outcome Measures for Adults with Neurologic Conditions, 2018)    Standing therex.  Hip abduction AROM x  12 bil  Hip extension AROM x 12 bil cues for decreased trunkal and hip compensations.  Squat with UE support on rail x 12     PATIENT EDUCATION: Education details: POC. Benefits of dynamic balance training. Hip joint anatomy. Pt educated throughout session about proper posture and technique with exercises. Improved exercise technique, movement at target joints, use of target muscles after min to mod verbal, visual, tactile cues.   Person educated: Patient Education method: Explanation Education comprehension: verbalized understanding  HOME EXERCISE PROGRAM: Access Code: [email or web address] URL: [email or web address] Date: 04/30/2023 Prepared by: Thresa Ross  Exercises - Small Range Straight Leg Raise  - 1 x daily - 7 x weekly - 3 sets - 10 reps - Supine Bridge  - 3-4 x weekly - 3 sets - 10 reps - Seated Long Arc Quad  - 2-3 x daily - 7 x weekly - 3 sets - 10 reps - Seated Ankle Pumps  - 1 x daily - 7 x weekly - 3 sets - 10 reps - Seated March  - 1 x daily - 7 x weekly - 3 sets - 10 reps - Standing March with Counter Support  - 1 x daily - 7 x weekly - 3 sets - 8 reps - Sit to Stand with Counter Support  - 1 x daily - 7 x weekly - 3 sets - 5 reps   GOALS: Goals reviewed with patient? Yes  SHORT TERM GOALS: Target date: 05/26/2023 Patient will be independent in home exercise program to improve strength/mobility for better functional independence with ADLs. Baseline: provided 1/30 Goal status: MET  LONG TERM GOALS: Target date: 07/21/2023  Patient will increase ABC score by equal to or greater than  8%   to demonstrate statistically significant improvement in mobility and quality of life.  Baseline: 77.5  3/3:75 4/7: 83% Goal status: MET  2.  Patient (> 75 years old) will complete five times sit to stand test in < 15 seconds indicating an increased LE strength and improved balance Baseline:15.5sec 06/01/23: 14.71 4/7: 13.35 sec no UE support  Goal status: MET  3.   Patient will increase Berg Balance score by > 6 points to demonstrate decreased fall risk during functional activities Baseline: 41 06/01/23: 46 4/7: 48 Goal status: MET  4.  Patient will increase 10 meter walk test to >1.58m/s as to improve gait speed for better community ambulation and to reduce fall risk. Baseline: 0.23m/s(15.2 sec)  3/3: 0.59m/s  4/7: 0.84 m/s Goal status: IN PROGRESS  5.  Patient will reduce timed up and go to <11 seconds to reduce fall risk and demonstrate improved transfer/gait ability. Baseline: 21.54sec 3/3: 14.24 sec avg.  4/7: 13.36 sec  no UE support   Goal status: IN PROGRESS  ASSESSMENT:  CLINICAL IMPRESSION: Continued with pre-hab for L THA. Pt reports that pulmonology has cleared pt as surgical candidate. Chest CT performed last week. Still awaiting results. Follow up with orthopedic surgry next week. Pt demonstrated improved mobility and reduced fall risk with improved Tug, Berg, gait speed, 5xSTS and ABC. Pt in agreement to decrease frequency of PT as gains are now limited hip pain and severity of arthritis.   Patient's condition has the potential to improve in response to therapy. Maximum improvement is yet to be obtained. The anticipated improvement is attainable and reasonable in a generally predictable time.  Pt will continue to benefit from skilled physical therapy intervention to address impairments, improve QOL, and attain therapy goals.    OBJECTIVE IMPAIRMENTS: Abnormal gait, decreased activity tolerance, decreased balance, decreased endurance, decreased knowledge of condition, decreased knowledge of use of DME, decreased mobility, difficulty walking, decreased ROM, decreased strength, impaired perceived functional ability, impaired flexibility, impaired sensation, impaired UE functional use, and improper body mechanics.   ACTIVITY LIMITATIONS: stairs, transfers, and locomotion level  PARTICIPATION LIMITATIONS: cleaning, laundry, community activity,  and yard work  PERSONAL FACTORS: 1-2 comorbidities: moderately severe arthropathy   are also affecting patient's functional outcome.   REHAB POTENTIAL: Good  CLINICAL DECISION MAKING: Stable/uncomplicated  EVALUATION COMPLEXITY: Moderate  PLAN:  PT FREQUENCY: 1-2x/week  PT DURATION: 12 weeks  PLANNED INTERVENTIONS: 97110-Therapeutic exercises, 97530- Therapeutic activity, [id number]- Neuromuscular re-education, 97535- Self Care, [id number]- Manual therapy, [id number]- Gait training, [phone]- Orthotic Fit/training, [phone]- Aquatic Therapy, [phone]- Splinting, Balance training, Stair training, Dry Needling, Joint mobilization, Joint manipulation, Compression bandaging, DME instructions, Cryotherapy, and Moist heat  PLAN FOR NEXT SESSION:   Continues BLE strength, dynamic balance Pain management with manual therapy.   Golden Pop PT ,DPT Physical Therapist- [place]  [facility]

## 2023-07-08 ENCOUNTER — Ambulatory Visit: Payer: Medicare Other | Admitting: Physical Therapy

## 2023-07-13 ENCOUNTER — Ambulatory Visit: Payer: Medicare Other | Admitting: Physical Therapy

## 2023-07-13 DIAGNOSIS — R269 Unspecified abnormalities of gait and mobility: Secondary | ICD-10-CM

## 2023-07-13 DIAGNOSIS — R2681 Unsteadiness on feet: Secondary | ICD-10-CM

## 2023-07-13 DIAGNOSIS — M6281 Muscle weakness (generalized): Secondary | ICD-10-CM

## 2023-07-13 NOTE — Therapy (Signed)
 OUTPATIENT PHYSICAL THERAPY TREATMENT    Patient Name: Todd Farley MRN: [id number] DOB:Aug 01, 1945, 78 y.o., male Today's Date: 07/13/2023   PCP: Little Riff, MD  REFERRING PROVIDER: Rosan Comfort, MD   END OF SESSION:  PT End of Session - 07/13/23 1449     Visit Number 21    Number of Visits 24    PT Start Time 1449    PT Stop Time 1530    PT Time Calculation (min) 41 min    Activity Tolerance Patient tolerated treatment well;No increased pain    Behavior During Therapy WFL for tasks assessed/performed                 Past Medical History:  Diagnosis Date   Actinic keratosis    Anemia    Arthritis    Bronchial stenosis, right    Cancer (HCC)    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   Diabetes mellitus without complication (HCC)    GERD (gastroesophageal reflux disease)    High cholesterol    History of kidney stones    Hypertension    Neuropathy    Umbilical hernia    Past Surgical History:  Procedure Laterality Date   BRONCHIAL WASHINGS N/A 01/16/2023   Procedure: BRONCHIAL WASHINGS;  Surgeon: Erskin Hearing, MD;  Location: [facility] ORS;  Service: Thoracic;  Laterality: N/A;   CARPAL TUNNEL RELEASE Right    COLONOSCOPY WITH PROPOFOL N/A 06/25/2015   Procedure: COLONOSCOPY WITH PROPOFOL;  Surgeon: Cassie Click, MD;  Location: [facility] ENDOSCOPY;  Service: Endoscopy;  Laterality: N/A;   ESOPHAGOGASTRODUODENOSCOPY (EGD) WITH PROPOFOL N/A 06/25/2015   Procedure: ESOPHAGOGASTRODUODENOSCOPY (EGD) WITH PROPOFOL;  Surgeon: Cassie Click, MD;  Location: [facility] ENDOSCOPY;  Service: Endoscopy;  Laterality: N/A;   EYE SURGERY     eyelids x2   EYE SURGERY     cataract x 2 - intraocular implants   FLEXIBLE BRONCHOSCOPY N/A 01/16/2023   Procedure: FLEXIBLE BRONCHOSCOPY;  Surgeon: Erskin Hearing, MD;  Location: [facility] ORS;  Service: Thoracic;  Laterality: N/A;   HERNIA REPAIR     JOINT REPLACEMENT Right    knee   KNEE ARTHROSCOPY Right    acl repair   Patient Active  Problem List   Diagnosis Date Noted   Umbilical hernia without obstruction and without gangrene 01/26/2018   Diabetic polyneuropathy associated with type 2 diabetes mellitus (HCC) 10/13/2017   GERD (gastroesophageal reflux disease) 04/13/2015   Diabetes mellitus type 2, uncomplicated (HCC) 03/14/2014   Hypertension 03/14/2014    ONSET DATE: >1 year  REFERRING DIAG:  Diagnosis  R26.89 (ICD-10-CM) - Imbalance    THERAPY DIAG:  Unsteadiness on feet  Muscle weakness (generalized)  Abnormality of gait and mobility  Rationale for Evaluation and Treatment: Rehabilitation  SUBJECTIVE:  SUBJECTIVE STATEMENT:  Pt reports that he is doing well. No pain to report at of PT session. Pt states that he understands that continued progress is limited by hip pain and severity of arthritis. Agreeable to decrease to once per week and increase HEP.   States that next follow-up with surgeon will by on 4/16 to schedule possible hip replacement.     PERTINENT HISTORY:  Recent MD appointment for hip pain.   "Left hip pain. He has known osteoarthritis. Is been getting worse. He did have PET scan done just recently which had an incidental finding of moderately severe arthropathy in the left hip. Have made referral to orthopedics for further workup and treatment. " From Neurology:  "Patient states his neuropathy symptoms are worse. Increased numbness in his feet. At times is he is not paying attention he does not known where his feet are. His balance is worse. He has had two falls since his last visit. Using a cane to ambulate now. He was seen at [facility] on 09/09/2022 after a fall. This fall was mechanical. He was wheeling his garbage cans to the curb early in the morning on minimal sleep. Prior to  this, had a mechanical fall over a terrain change. Has difficulty with prolonged standing secondary to worsening imbalance and pain (left > right). Denies fasciculations, cramping, myalgia, foot drop. "  PAIN:  Are you having pain? 3-4/10   PRECAUTIONS: Fall  RED FLAGS: None   WEIGHT BEARING RESTRICTIONS: No  FALLS: Has patient fallen in last 6 months? No and multiple near falls   PATIENT GOALS: improve balance, strength, endurance   OBJECTIVE:  Note: Objective measures were completed at Evaluation unless otherwise noted.  DIAGNOSTIC FINDINGS:  EXAM: NUCLEAR MEDICINE PET SKULL BASE TO THIGH IMPRESSION: 1. No findings suspicious for thoracic malignancy. No definite residual wall thickening or hypermetabolic activity within the proximal right lower lobe bronchus. Correlate with prior bronchoscopy results and consider follow-up chest CT in 3-6 months, as clinically warranted. 2. No suspicious metabolic activity identified within the neck, abdomen or pelvis. 3. Healing fracture of the right 4th rib anteriorly with low level metabolic activity. 4. Hepatic steatosis and colonic diverticulosis.  COGNITION: Overall cognitive status: Within functional limits for tasks assessed   SENSATION: Hx of diabetic neuropathy in bil ft and ankles. Medial worse than lateral    COORDINATION: Grossly WFL, but mild foot drop on the RLE.   EDEMA:  Mild bil distal edema  POSTURE:  forward head and posterior pelvic tilt  LOWER EXTREMITY ROM:    Grossly WFL   LOWER EXTREMITY MMT:    MMT Right Eval Left Eval  Hip flexion 4 4-  Hip extension    Hip abduction 4+ 4+  Hip adduction 4+ 4  Hip internal rotation    Hip external rotation    Knee flexion 4+ 4-  Knee extension 5 4-  Ankle dorsiflexion 4 4-  Ankle plantarflexion    Ankle inversion    Ankle eversion    (Blank rows = not tested)  TRANSFERS: Assistive device utilized: None  Sit to stand: Modified independence Stand to  sit: Complete Independence and Modified independence Chair to chair: Modified independence Floor:  to be assessed. Required assist with recovery from fall > 6 months ago   RAMP:  Level of Assistance: Modified independence Assistive device utilized: Single point cane Ramp Comments:  CURB:  Level of Assistance: Modified independence Assistive device utilized:  rail  Curb Comments:  STAIRS: Level of Assistance: Modified independence Stair Negotiation Technique: Step to Pattern Alternating Pattern  with Bilateral Rails Number of Stairs: 4  Height of Stairs: 6  Comments: step to ascent, step through descent   GAIT: Gait pattern:  excessive hip ER and ankle eversion on the RLE, decreased ankle dorsiflexion- Left, Right foot flat, Left foot flat, lateral hip instability, wide BOS, and poor foot clearance- Left Distance walked: 60 Assistive device utilized: Single point cane and None Level of assistance: Modified independence Comments: noted to have lateral hip instability with increased time in standing   FUNCTIONAL TESTS:  5 times sit to stand: 15.5 sec  Timed up and go (TUG): 21.54 6 minute walk test: TBD 10 meter walk test: 15.2sec  Berg Balance Scale: TBD   PATIENT SURVEYS:  ABC scale 77.5% 3/3 75%                                                                                                                             TREATMENT DATE: 07/13/23  Therex : LAQ x15 5 # AW Seated Hip flexion/abduction x 15 5# AW  Foot tap on 6 inch step x 15 with 5# AW no UE support  Standing HS curl x 12 bil  Standing hip extension x 15 bil  Weighted gait training with 5 # AW 2 x 63ft  Hip extension 5 # AW x 12 bil cues for decreased trunkal and hip compensations.  Sit<>stand x 10 without UE support  Step up 6inch step with x 5 bil 1 UE support for each.   Cues for increased hold at end range and decreased eccentric speed to improve strengthening aspect of movement.   PATIENT  EDUCATION: Education details: POC. Benefits of dynamic balance training. Hip joint anatomy. Pt educated throughout session about proper posture and technique with exercises. Improved exercise technique, movement at target joints, use of target muscles after min to mod verbal, visual, tactile cues.   Person educated: Patient Education method: Explanation Education comprehension: verbalized understanding  HOME EXERCISE PROGRAM: Access Code: [email or web address] URL: [email or web address] Date: 04/30/2023 Prepared by: Thresa Ross  Exercises - Small Range Straight Leg Raise  - 1 x daily - 7 x weekly - 3 sets - 10 reps - Supine Bridge  - 3-4 x weekly - 3 sets - 10 reps - Seated Long Arc Quad  - 2-3 x daily - 7 x weekly - 3 sets - 10 reps - Seated Ankle Pumps  - 1 x daily - 7 x weekly - 3 sets - 10 reps - Seated March  - 1 x daily - 7 x weekly - 3 sets - 10 reps - Standing March with Counter Support  - 1 x daily - 7 x weekly - 3 sets - 8 reps - Sit to Stand with Counter Support  - 1 x daily - 7 x weekly - 3 sets - 5 reps   GOALS: Goals reviewed with patient? Yes  SHORT  TERM GOALS: Target date: 05/26/2023 Patient will be independent in home exercise program to improve strength/mobility for better functional independence with ADLs. Baseline: provided 1/30 Goal status: MET  LONG TERM GOALS: Target date: 07/21/2023  Patient will increase ABC score by equal to or greater than  8%   to demonstrate statistically significant improvement in mobility and quality of life.  Baseline: 77.5  3/3:75 4/7: 83% Goal status: MET  2.  Patient (> 42 years old) will complete five times sit to stand test in < 15 seconds indicating an increased LE strength and improved balance Baseline:15.5sec 06/01/23: 14.71 4/7: 13.35 sec no UE support  Goal status: MET  3.  Patient will increase Berg Balance score by > 6 points to demonstrate decreased fall risk during functional activities Baseline:  41 06/01/23: 46 4/7: 48 Goal status: MET  4.  Patient will increase 10 meter walk test to >1.27m/s as to improve gait speed for better community ambulation and to reduce fall risk. Baseline: 0.8m/s(15.2 sec)  3/3: 0.66m/s  4/7: 0.84 m/s Goal status: IN PROGRESS  5.  Patient will reduce timed up and go to <11 seconds to reduce fall risk and demonstrate improved transfer/gait ability. Baseline: 21.54sec 3/3: 14.24 sec avg.  4/7: 13.36 sec no UE support   Goal status: IN PROGRESS  ASSESSMENT:  CLINICAL IMPRESSION: Continued with pre-hab for L THA. Pt reports that pulmonology has cleared pt as surgical candidate. Chest CT performed 2 weeks prior. Still awaiting results. Follow up with orthopedic surgry later this week. PT treatment focused on BLE strengthening increased ROm into hip extension as tolerated. Pt reports no increase in pain throughout session, but acknowledges use of pain medicine prior to PT. Awaiting plan for continued PT based on recommendation from orthopedic surgeon. Pt will continue to benefit from skilled physical therapy intervention to address impairments, improve QOL, and attain therapy goals.    OBJECTIVE IMPAIRMENTS: Abnormal gait, decreased activity tolerance, decreased balance, decreased endurance, decreased knowledge of condition, decreased knowledge of use of DME, decreased mobility, difficulty walking, decreased ROM, decreased strength, impaired perceived functional ability, impaired flexibility, impaired sensation, impaired UE functional use, and improper body mechanics.   ACTIVITY LIMITATIONS: stairs, transfers, and locomotion level  PARTICIPATION LIMITATIONS: cleaning, laundry, community activity, and yard work  PERSONAL FACTORS: 1-2 comorbidities: moderately severe arthropathy   are also affecting patient's functional outcome.   REHAB POTENTIAL: Good  CLINICAL DECISION MAKING: Stable/uncomplicated  EVALUATION COMPLEXITY: Moderate  PLAN:  PT  FREQUENCY: 1-2x/week  PT DURATION: 12 weeks  PLANNED INTERVENTIONS: 97110-Therapeutic exercises, 97530- Therapeutic activity, [id number]- Neuromuscular re-education, 97535- Self Care, [id number]- Manual therapy, [id number]- Gait training, [id number]- Orthotic Fit/training, [phone]- Aquatic Therapy, [phone]- Splinting, Balance training, Stair training, Dry Needling, Joint mobilization, Joint manipulation, Compression bandaging, DME instructions, Cryotherapy, and Moist heat  PLAN FOR NEXT SESSION:   Continues BLE strength, dynamic balance Pain management with manual therapy.   Barbara Book PT ,DPT Physical Therapist- [place]  [facility]

## 2023-07-15 ENCOUNTER — Ambulatory Visit: Payer: Medicare Other | Admitting: Physical Therapy

## 2023-07-20 ENCOUNTER — Ambulatory Visit: Payer: Medicare Other | Admitting: Physical Therapy

## 2023-07-20 DIAGNOSIS — M6281 Muscle weakness (generalized): Secondary | ICD-10-CM

## 2023-07-20 DIAGNOSIS — R2681 Unsteadiness on feet: Secondary | ICD-10-CM

## 2023-07-20 DIAGNOSIS — R269 Unspecified abnormalities of gait and mobility: Secondary | ICD-10-CM

## 2023-07-20 NOTE — Therapy (Signed)
 OUTPATIENT PHYSICAL THERAPY TREATMENT/  Discharge Summary     Patient Name: Todd Farley MRN: [id number] DOB:08-27-1945, 78 y.o., male Today's Date: 07/20/2023   PCP: Little Riff, MD  REFERRING PROVIDER: Rosan Comfort, MD   END OF SESSION:  PT End of Session - 07/20/23 1443     Visit Number 22    Number of Visits 24    PT Start Time 1448    PT Stop Time 1530    PT Time Calculation (min) 42 min    Activity Tolerance Patient tolerated treatment well;No increased pain    Behavior During Therapy WFL for tasks assessed/performed                 Past Medical History:  Diagnosis Date   Actinic keratosis    Anemia    Arthritis    Bronchial stenosis, right    Cancer (HCC)    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   Diabetes mellitus without complication (HCC)    GERD (gastroesophageal reflux disease)    High cholesterol    History of kidney stones    Hypertension    Neuropathy    Umbilical hernia    Past Surgical History:  Procedure Laterality Date   BRONCHIAL WASHINGS N/A 01/16/2023   Procedure: BRONCHIAL WASHINGS;  Surgeon: Erskin Hearing, MD;  Location: [facility] ORS;  Service: Thoracic;  Laterality: N/A;   CARPAL TUNNEL RELEASE Right    COLONOSCOPY WITH PROPOFOL  N/A 06/25/2015   Procedure: COLONOSCOPY WITH PROPOFOL ;  Surgeon: Cassie Click, MD;  Location: [facility] ENDOSCOPY;  Service: Endoscopy;  Laterality: N/A;   ESOPHAGOGASTRODUODENOSCOPY (EGD) WITH PROPOFOL  N/A 06/25/2015   Procedure: ESOPHAGOGASTRODUODENOSCOPY (EGD) WITH PROPOFOL ;  Surgeon: Cassie Click, MD;  Location: [facility] ENDOSCOPY;  Service: Endoscopy;  Laterality: N/A;   EYE SURGERY     eyelids x2   EYE SURGERY     cataract x 2 - intraocular implants   FLEXIBLE BRONCHOSCOPY N/A 01/16/2023   Procedure: FLEXIBLE BRONCHOSCOPY;  Surgeon: Erskin Hearing, MD;  Location: [facility] ORS;  Service: Thoracic;  Laterality: N/A;   HERNIA REPAIR     JOINT REPLACEMENT Right    knee   KNEE ARTHROSCOPY Right    acl repair    Patient Active Problem List   Diagnosis Date Noted   Umbilical hernia without obstruction and without gangrene 01/26/2018   Diabetic polyneuropathy associated with type 2 diabetes mellitus (HCC) 10/13/2017   GERD (gastroesophageal reflux disease) 04/13/2015   Diabetes mellitus type 2, uncomplicated (HCC) 03/14/2014   Hypertension 03/14/2014    ONSET DATE: >1 year  REFERRING DIAG:  Diagnosis  R26.89 (ICD-10-CM) - Imbalance    THERAPY DIAG:  Unsteadiness on feet  Muscle weakness (generalized)  Abnormality of gait and mobility  Rationale for Evaluation and Treatment: Rehabilitation  SUBJECTIVE:  SUBJECTIVE STATEMENT:  Pt reports that he is doing well. No pain to report at of PT session. Was able to mow yard with push mower prior to PT, then take low dose pain medication. Pt reports that appointment went well with MD, and is planning on proceeding with hip replacement. Feels like he can continue care prior to surgery with interventions from HEP, and will hope to restart PT following hip replacement.    PERTINENT HISTORY:  Recent MD appointment for hip pain.   "Left hip pain. He has known osteoarthritis. Is been getting worse. He did have PET scan done just recently which had an incidental finding of moderately severe arthropathy in the left hip. Have made referral to orthopedics for further workup and treatment. " From Neurology:  "Patient states his neuropathy symptoms are worse. Increased numbness in his feet. At times is he is not paying attention he does not known where his feet are. His balance is worse. He has had two falls since his last visit. Using a cane to ambulate now. He was seen at [facility] on 09/09/2022 after a fall. This fall was mechanical. He was wheeling  his garbage cans to the curb early in the morning on minimal sleep. Prior to this, had a mechanical fall over a terrain change. Has difficulty with prolonged standing secondary to worsening imbalance and pain (left > right). Denies fasciculations, cramping, myalgia, foot drop. "  PAIN:  Are you having pain? 3-4/10   PRECAUTIONS: Fall  RED FLAGS: None   WEIGHT BEARING RESTRICTIONS: No  FALLS: Has patient fallen in last 6 months? No and multiple near falls   PATIENT GOALS: improve balance, strength, endurance   OBJECTIVE:  Note: Objective measures were completed at Evaluation unless otherwise noted.  DIAGNOSTIC FINDINGS:  EXAM: NUCLEAR MEDICINE PET SKULL BASE TO THIGH IMPRESSION: 1. No findings suspicious for thoracic malignancy. No definite residual wall thickening or hypermetabolic activity within the proximal right lower lobe bronchus. Correlate with prior bronchoscopy results and consider follow-up chest CT in 3-6 months, as clinically warranted. 2. No suspicious metabolic activity identified within the neck, abdomen or pelvis. 3. Healing fracture of the right 4th rib anteriorly with low level metabolic activity. 4. Hepatic steatosis and colonic diverticulosis.  COGNITION: Overall cognitive status: Within functional limits for tasks assessed   SENSATION: Hx of diabetic neuropathy in bil ft and ankles. Medial worse than lateral    COORDINATION: Grossly WFL, but mild foot drop on the RLE.   EDEMA:  Mild bil distal edema  POSTURE:  forward head and posterior pelvic tilt  LOWER EXTREMITY ROM:    Grossly WFL   LOWER EXTREMITY MMT:    MMT Right Eval Left Eval  Hip flexion 4 4-  Hip extension    Hip abduction 4+ 4+  Hip adduction 4+ 4  Hip internal rotation    Hip external rotation    Knee flexion 4+ 4-  Knee extension 5 4-  Ankle dorsiflexion 4 4-  Ankle plantarflexion    Ankle inversion    Ankle eversion    (Blank rows = not  tested)  TRANSFERS: Assistive device utilized: None  Sit to stand: Modified independence Stand to sit: Complete Independence and Modified independence Chair to chair: Modified independence Floor:  to be assessed. Required assist with recovery from fall > 6 months ago   RAMP:  Level of Assistance: Modified independence Assistive device utilized: Single point cane Ramp Comments:  CURB:  Level of Assistance:  Modified independence Assistive device utilized:  rail  Curb Comments:   STAIRS: Level of Assistance: Modified Automotive engineer Technique: Step to Pattern Alternating Pattern  with Bilateral Rails Number of Stairs: 4  Height of Stairs: 6  Comments: step to ascent, step through descent   GAIT: Gait pattern:  excessive hip ER and ankle eversion on the RLE, decreased ankle dorsiflexion- Left, Right foot flat, Left foot flat, lateral hip instability, wide BOS, and poor foot clearance- Left Distance walked: 60 Assistive device utilized: Single point cane and None Level of assistance: Modified independence Comments: noted to have lateral hip instability with increased time in standing   FUNCTIONAL TESTS:  See below   PATIENT SURVEYS:  ABC scale 77.5% 3/3 75%                                                                                                                             TREATMENT DATE: 07/20/23  PT instructed pt in TUG: 13.13 sec no UE support  sec (average of 3 trials; >13.5 sec indicates increased fall risk)  Pt performed 5 time sit<>stand (5xSTS):  12.51 sec no UE support  sec (>15 sec indicates increased fall risk)    Patient demonstrates increased fall risk as noted by score of   50/56 on Berg Balance Scale.  (<36= high risk for falls, close to 100%; 37-45 significant >80%; 46-51 moderate >50%; 52-55 lower >25%)  [facility] PT Assessment - 07/20/23 0001       Berg Balance Test   Sit to Stand Able to stand without using hands and stabilize  independently    Standing Unsupported Able to stand safely 2 minutes    Sitting with Back Unsupported but Feet Supported on Floor or Stool Able to sit safely and securely 2 minutes    Stand to Sit Sits safely with minimal use of hands    Transfers Able to transfer safely, minor use of hands    Standing Unsupported with Eyes Closed Able to stand 10 seconds safely    Standing Unsupported with Feet Together Able to place feet together independently and stand 1 minute safely    From Standing, Reach Forward with Outstretched Arm Can reach confidently >25 cm (10")    From Standing Position, Pick up Object from Floor Able to pick up shoe safely and easily    From Standing Position, Turn to Look Behind Over each Shoulder Looks behind from both sides and weight shifts well    Turn 360 Degrees Able to turn 360 degrees safely but slowly    Standing Unsupported, Alternately Place Feet on Step/Stool Able to stand independently and safely and complete 8 steps in 20 seconds    Standing Unsupported, One Foot in Front Able to plae foot ahead of the other independently and hold 30 seconds    Standing on One Leg Tries to lift leg/unable to hold 3 seconds but remains standing independently    Total Score 50  HEP review:  Access Code: [email or web address] URL: [email or web address] Date: 07/20/2023 Prepared by: Aurora Lees  Exercises - Seated Long Arc Quad  - 2-3 x daily - 7 x weekly - 3 sets - 10 reps - Seated March  - 1 x daily - 7 x weekly - 3 sets - 10 reps - Standing March with Counter Support  - 1 x daily - 7 x weekly - 3 sets - 8 reps - Sit to Stand with Counter Support  - 1 x daily - 7 x weekly - 3 sets - 5 reps - Seated Ankle Plantarflexion with Resistance  - 1 x daily - 7 x weekly - 2 sets - 20 reps - Seated Hip Abduction with Resistance  - 1 x daily - 7 x weekly - 3 sets - 10 reps - Seated Heel Raise  - 1 x daily - 7 x weekly - 3 sets - 10 reps - Seated Hip Adduction  Isometrics with Ball  - 1 x daily - 7 x weekly - 3 sets - 12 reps - 3 hold - Standing Hip Abduction with Counter Support  - 1 x daily - 7 x weekly - 3 sets - 10 reps - Side Stepping with Counter Support  - 1 x daily - 7 x weekly - 3 sets - 10 reps - Standing Hip Extension with Counter Support  - 1 x daily - 7 x weekly - 3 sets - 10 reps  Cues for increased hold at end range and decreased eccentric speed to improve strengthening aspect of movement.   PATIENT EDUCATION: Education details: POC. Benefits of dynamic balance training. Hip joint anatomy. Pt educated throughout session about proper posture and technique with exercises. Improved exercise technique, movement at target joints, use of target muscles after min to mod verbal, visual, tactile cues.   Person educated: Patient Education method: Explanation Education comprehension: verbalized understanding  HOME EXERCISE PROGRAM: Access Code: [email or web address] URL: [email or web address] Date: 07/20/2023 Prepared by: Aurora Lees  Exercises - Small Range Straight Leg Raise  - 1 x daily - 7 x weekly - 3 sets - 10 reps - Supine Bridge  - 3-4 x weekly - 3 sets - 10 reps - Seated Long Arc Quad  - 2-3 x daily - 7 x weekly - 3 sets - 10 reps - Seated March  - 1 x daily - 7 x weekly - 3 sets - 10 reps - Standing March with Counter Support  - 1 x daily - 7 x weekly - 3 sets - 8 reps - Sit to Stand with Counter Support  - 1 x daily - 7 x weekly - 3 sets - 5 reps - Seated Ankle Plantarflexion with Resistance  - 1 x daily - 7 x weekly - 2 sets - 20 reps - Sit to Stand with Resistance Around Legs  - 1 x daily - 7 x weekly - 3 sets - 10 reps - Seated Hip Abduction with Resistance  - 1 x daily - 7 x weekly - 3 sets - 10 reps - Seated Heel Raise  - 1 x daily - 7 x weekly - 3 sets - 10 reps - Seated Hip Adduction Isometrics with Ball  - 1 x daily - 7 x weekly - 3 sets - 12 reps - 3 hold - Standing Hip Abduction with Counter Support  - 1 x daily  - 7 x weekly - 3 sets - 10 reps - Side Stepping with Counter Support  - 1 x daily -  7 x weekly - 3 sets - 10 reps - Standing Hip Extension with Counter Support  - 1 x daily - 7 x weekly - 3 sets - 10 reps   GOALS: Goals reviewed with patient? Yes  SHORT TERM GOALS: Target date: 05/26/2023 Patient will be independent in home exercise program to improve strength/mobility for better functional independence with ADLs. Baseline: provided 1/30 Goal status: MET  LONG TERM GOALS: Target date: 07/21/2023  Patient will increase ABC score by equal to or greater than  8%   to demonstrate statistically significant improvement in mobility and quality of life.  Baseline: 77.5  3/3:75 4/7: 83%  Goal status: MET  2.  Patient (> 82 years old) will complete five times sit to stand test in < 15 seconds indicating an increased LE strength and improved balance Baseline:15.5sec 06/01/23: 14.71 4/7: 13.35 sec no UE support  4/21: 12.51 sec no UE support  Goal status: MET  3.  Patient will increase Berg Balance score by > 6 points to demonstrate decreased fall risk during functional activities Baseline: 41 06/01/23: 46 4/7: 48 4/21: 50  Goal status: MET  4.  Patient will increase 10 meter walk test to >1.67m/s as to improve gait speed for better community ambulation and to reduce fall risk. Baseline: 0.72m/s(15.2 sec)  3/3: 0.52m/s  4/7: 0.84 m/s 4/21: 0.66m/s  Goal status: IN PROGRESS  5.  Patient will reduce timed up and go to <11 seconds to reduce fall risk and demonstrate improved transfer/gait ability. Baseline: 21.54sec 3/3: 14.24 sec avg.  4/7: 13.36 sec no UE support   4/21: 13.13 sec no UE support  Goal status: IN PROGRESS  ASSESSMENT:  CLINICAL IMPRESSION: Continued with pre-hab for L THA. Pt reports that pulmonology has cleared pt as surgical candidate. Chest CT performed 2 weeks prior. No significant changes from prior study. Pt met with orthopedic surgeon; plan to move forward with  hip replacement in the next couple weeks. Re-assessed LTG to measure progress, noted to continue to advance with functional balance and reduced fall risk, but still unable to meet goal for gait speed or TUG.  At this time, pt will no longer require skilled PT to address deficits, as pt is preparing for hip replacement and is independent with HEP.    OBJECTIVE IMPAIRMENTS: Abnormal gait, decreased activity tolerance, decreased balance, decreased endurance, decreased knowledge of condition, decreased knowledge of use of DME, decreased mobility, difficulty walking, decreased ROM, decreased strength, impaired perceived functional ability, impaired flexibility, impaired sensation, impaired UE functional use, and improper body mechanics.   ACTIVITY LIMITATIONS: stairs, transfers, and locomotion level  PARTICIPATION LIMITATIONS: cleaning, laundry, community activity, and yard work  PERSONAL FACTORS: 1-2 comorbidities: moderately severe arthropathy   are also affecting patient's functional outcome.   REHAB POTENTIAL: Good  CLINICAL DECISION MAKING: Stable/uncomplicated  EVALUATION COMPLEXITY: Moderate  PLAN:  PT FREQUENCY: 1-2x/week  PT DURATION: 12 weeks  PLANNED INTERVENTIONS: 97110-Therapeutic exercises, 97530- Therapeutic activity, [id number]- Neuromuscular re-education, 97535- Self Care, [id number]- Manual therapy, [id number]- Gait training, [phone]- Orthotic Fit/training, [phone]- Aquatic Therapy, [phone]- Splinting, Balance training, Stair training, Dry Needling, Joint mobilization, Joint manipulation, Compression bandaging, DME instructions, Cryotherapy, and Moist heat  PLAN FOR NEXT SESSION:  N.A.   Barbara Book PT ,DPT Physical Therapist- [place]  [employer]

## 2023-07-22 ENCOUNTER — Ambulatory Visit: Payer: Medicare Other | Admitting: Physical Therapy

## 2023-08-06 ENCOUNTER — Other Ambulatory Visit: Payer: Self-pay | Admitting: Orthopedic Surgery

## 2023-08-25 ENCOUNTER — Encounter
Admission: RE | Admit: 2023-08-25 | Discharge: 2023-08-25 | Disposition: A | Discharge status: Home / Self Care | Source: Ambulatory Visit | Attending: Orthopedic Surgery | Admitting: Orthopedic Surgery

## 2023-08-25 ENCOUNTER — Other Ambulatory Visit: Payer: Self-pay

## 2023-08-25 VITALS — BP 139/82 | HR 73 | Resp 24 | Ht 73.5 in | Wt 205.0 lb

## 2023-08-25 DIAGNOSIS — Z01810 Encounter for preprocedural cardiovascular examination: Secondary | ICD-10-CM | POA: Diagnosis present

## 2023-08-25 DIAGNOSIS — Z01812 Encounter for preprocedural laboratory examination: Secondary | ICD-10-CM | POA: Diagnosis not present

## 2023-08-25 DIAGNOSIS — E119 Type 2 diabetes mellitus without complications: Secondary | ICD-10-CM | POA: Insufficient documentation

## 2023-08-25 DIAGNOSIS — Z01818 Encounter for other preprocedural examination: Secondary | ICD-10-CM | POA: Diagnosis not present

## 2023-08-25 HISTORY — DX: Unspecified chronic bronchitis: J42

## 2023-08-25 HISTORY — DX: Pneumonia, unspecified organism: J18.9

## 2023-08-25 LAB — TYPE AND SCREEN
ABO/RH(D): O POS
Antibody Screen: NEGATIVE

## 2023-08-25 LAB — CBC WITH DIFFERENTIAL/PLATELET
Abs Immature Granulocytes: 0.01 10*3/uL (ref 0.00–0.07)
Basophils Absolute: 0.1 10*3/uL (ref 0.0–0.1)
Basophils Relative: 1 %
Eosinophils Absolute: 0.2 10*3/uL (ref 0.0–0.5)
Eosinophils Relative: 4 %
HCT: 39.1 % (ref 39.0–52.0)
Hemoglobin: 12.4 g/dL — ABNORMAL LOW (ref 13.0–17.0)
Immature Granulocytes: 0 %
Lymphocytes Relative: 19 %
Lymphs Abs: 1.1 10*3/uL (ref 0.7–4.0)
MCH: 29.9 pg (ref 26.0–34.0)
MCHC: 31.7 g/dL (ref 30.0–36.0)
MCV: 94.2 fL (ref 80.0–100.0)
Monocytes Absolute: 0.5 10*3/uL (ref 0.1–1.0)
Monocytes Relative: 8 %
Neutro Abs: 4 10*3/uL (ref 1.7–7.7)
Neutrophils Relative %: 68 %
Platelets: 305 10*3/uL (ref 150–400)
RBC: 4.15 MIL/uL — ABNORMAL LOW (ref 4.22–5.81)
RDW: 13.9 % (ref 11.5–15.5)
WBC: 5.8 10*3/uL (ref 4.0–10.5)
nRBC: 0 % (ref 0.0–0.2)

## 2023-08-25 LAB — URINALYSIS, ROUTINE W REFLEX MICROSCOPIC
Bilirubin Urine: NEGATIVE
Glucose, UA: NEGATIVE mg/dL
Hgb urine dipstick: NEGATIVE
Ketones, ur: NEGATIVE mg/dL
Leukocytes,Ua: NEGATIVE
Nitrite: NEGATIVE
Protein, ur: NEGATIVE mg/dL
Specific Gravity, Urine: 1.026 (ref 1.005–1.030)
pH: 5 (ref 5.0–8.0)

## 2023-08-25 LAB — COMPREHENSIVE METABOLIC PANEL WITH GFR
ALT: 24 U/L (ref 0–44)
AST: 27 U/L (ref 15–41)
Albumin: 3.8 g/dL (ref 3.5–5.0)
Alkaline Phosphatase: 56 U/L (ref 38–126)
Anion gap: 9 (ref 5–15)
BUN: 25 mg/dL — ABNORMAL HIGH (ref 8–23)
CO2: 24 mmol/L (ref 22–32)
Calcium: 9.2 mg/dL (ref 8.9–10.3)
Chloride: 106 mmol/L (ref 98–111)
Creatinine, Ser: 0.93 mg/dL (ref 0.61–1.24)
GFR, Estimated: >60 mL/min (ref >60)
Glucose, Bld: 109 mg/dL — ABNORMAL HIGH (ref 70–99)
Potassium: 4.5 mmol/L (ref 3.5–5.1)
Sodium: 139 mmol/L (ref 135–145)
Total Bilirubin: 0.6 mg/dL (ref 0.0–1.2)
Total Protein: 6.7 g/dL (ref 6.5–8.1)

## 2023-08-25 LAB — SURGICAL PCR SCREEN
MRSA, PCR: NEGATIVE
Staphylococcus aureus: NEGATIVE

## 2023-08-25 NOTE — Patient Instructions (Addendum)
 Your procedure is scheduled on: Monday 09/07/23 To find out your arrival time, please call [phone] between 1PM - 3PM on:   Friday 09/04/23 Report to the Registration Desk on the 1st floor of the [facility]. Free Valet parking is available.  If your arrival time is 6:00 am, do not arrive before that time as the [facility] entrance doors do not open until 6:00 am.  REMEMBER: Instructions that are not followed completely may result in serious medical risk, up to and including death; or upon the discretion of your surgeon and anesthesiologist your surgery may need to be rescheduled.  Do not eat food after midnight the night before surgery.  No gum chewing or hard candies.  You may however, drink CLEAR liquids up to 2 hours before you are scheduled to arrive for your surgery. Do not drink anything within 2 hours of your scheduled arrival time.  Clear liquids include: - water   Do NOT drink anything that is not on this list.  Type 1 and Type 2 diabetics should only drink water.  In addition, your doctor has ordered for you to drink the provided:  Gatorade G2 Drinking this carbohydrate drink up to two hours before surgery helps to reduce insulin resistance and improve patient outcomes. Please complete drinking 2 hours before scheduled arrival time.  One week prior to surgery: Stop Anti-inflammatories (NSAIDS) such as Advil, Aleve, Ibuprofen, Motrin, Naproxen, Naprosyn and Aspirin based products such as Excedrin, Goody's Powder, BC Powder. Hold Aspirin and Ibuprofen for 7 days starting on Monday 08/31/23 You may however, continue to take Tylenol if needed for pain up until the day of surgery.  Stop ANY OVER THE COUNTER supplements and vitamins for 7 days until after surgery. Iron B12 Folic Acid Multi Vitamin  Continue taking all prescribed medications with the exception of the following: Metformin last dose will be Friday 09/04/23  TAKE ONLY THESE MEDICATIONS THE MORNING OF SURGERY WITH  A SIP OF WATER:  loratadine (CLARITIN) 10 MG tablet  omeprazole (PRILOSEC) 40 MG capsule Antacid (take one the night before and one on the morning of surgery - helps to prevent nausea after surgery.) simvastatin (ZOCOR) 20 MG tablet   No Alcohol for 24 hours before or after surgery.  No Smoking including e-cigarettes for 24 hours before surgery.  No chewable tobacco products for at least 6 hours before surgery.  No nicotine patches on the day of surgery.  Do not use any "recreational" drugs for at least a week (preferably 2 weeks) before your surgery.  Please be advised that the combination of cocaine and anesthesia may have negative outcomes, up to and including death. If you test positive for cocaine, your surgery will be cancelled.  On the morning of surgery brush your teeth with toothpaste and water, you may rinse your mouth with mouthwash if you wish. Do not swallow any toothpaste or mouthwash.  Use CHG Soap or wipes as directed on instruction sheet.  Do not wear lotions, powders, or perfumes/cologne.   Do not shave body hair from the neck down 48 hours before surgery. You can shave face and neck  Wear comfortable clothing (specific to your surgery type) to the hospital.  Do not wear jewelry, make-up, hairpins, clips or nail polish.  For welded (permanent) jewelry: bracelets, anklets, waist bands, etc.  Please have this removed prior to surgery.  If it is not removed, there is a chance that hospital personnel will need to cut it off on the day  of surgery. Contact lenses, hearing aids and dentures may not be worn into surgery.  Do not bring valuables to the hospital. [facility] is not responsible for any missing/lost belongings or valuables.   Notify your doctor if there is any change in your medical condition (cold, fever, infection).  If you are being discharged the day of surgery, you will not be allowed to drive home. You will need a responsible individual to drive you  home and stay with you for 24 hours after surgery.   If you are taking public transportation, you will need to have a responsible individual with you.  If you are being admitted to the hospital overnight, leave your suitcase in the car. After surgery it may be brought to your room.  In case of increased patient census, it may be necessary for you, the patient, to continue your postoperative care in the Same Day Surgery department.  After surgery, you can help prevent lung complications by doing breathing exercises.  Take deep breaths and cough every 1-2 hours. Your doctor may order a device called an Incentive Spirometer to help you take deep breaths.  Surgery Visitation Policy:  Patients undergoing a surgery or procedure may have two family members or support persons with them as long as the person is not COVID-19 positive or experiencing its symptoms.   Inpatient Visitation:    Visiting hours are 7 a.m. to 8 p.m. Up to four visitors are allowed at one time in a patient room. The visitors may rotate out with other people during the day. One designated support person (adult) may remain overnight.  Please call the Pre-admissions Testing Dept. at [phone] if you have any questions about these instructions.    Pre-operative 5 CHG Bath Instructions   You can play a key role in reducing the risk of infection after surgery. Your skin needs to be as free of germs as possible. You can reduce the number of germs on your skin by washing with CHG (chlorhexidine  gluconate) soap before surgery. CHG is an antiseptic soap that kills germs and continues to kill germs even after washing.   DO NOT use if you have an allergy to chlorhexidine /CHG or antibacterial soaps. If your skin becomes reddened or irritated, stop using the CHG and notify one of our RNs at [phone].   Please shower with the CHG soap starting 4 days before surgery using the following schedule:   Thursday 09/03/23 - Monday  09/07/23    Please keep in mind the following:  DO NOT shave, including legs and underarms, starting the day of your first shower.   You may shave your face at any point before/day of surgery.  Place clean sheets on your bed the day you start using CHG soap. Use a clean washcloth (not used since being washed) for each shower. DO NOT sleep with pets once you start using the CHG.   CHG Shower Instructions:  If you choose to wash your hair and private area, wash first with your normal shampoo/soap.  After you use shampoo/soap, rinse your hair and body thoroughly to remove shampoo/soap residue.  Turn the water OFF and apply about 3 tablespoons (45 ml) of CHG soap to a CLEAN washcloth.  Apply CHG soap ONLY FROM YOUR NECK DOWN TO YOUR TOES (washing for 3-5 minutes)  DO NOT use CHG soap on face, private areas, open wounds, or sores.  Pay special attention to the area where your surgery is being performed.  If you are  having back surgery, having someone wash your back for you may be helpful. Wait 2 minutes after CHG soap is applied, then you may rinse off the CHG soap.  Pat dry with a clean towel  Put on clean clothes/pajamas   If you choose to wear lotion, please use ONLY the CHG-compatible lotions on the back of this paper.     Additional instructions for the day of surgery: DO NOT APPLY any lotions, deodorants, cologne, or perfumes.   Put on clean/comfortable clothes.  Brush your teeth.  Ask your nurse before applying any prescription medications to the skin.      CHG Compatible Lotions   Aveeno Moisturizing lotion  Cetaphil Moisturizing Cream  Cetaphil Moisturizing Lotion  Clairol Herbal Essence Moisturizing Lotion, Dry Skin  Clairol Herbal Essence Moisturizing Lotion, Extra Dry Skin  Clairol Herbal Essence Moisturizing Lotion, Normal Skin  Curel Age Defying Therapeutic Moisturizing Lotion with Alpha Hydroxy  Curel Extreme Care Body Lotion  Curel Soothing Hands Moisturizing Hand  Lotion  Curel Therapeutic Moisturizing Cream, Fragrance-Free  Curel Therapeutic Moisturizing Lotion, Fragrance-Free  Curel Therapeutic Moisturizing Lotion, Original Formula  Eucerin Daily Replenishing Lotion  Eucerin Dry Skin Therapy Plus Alpha Hydroxy Crme  Eucerin Dry Skin Therapy Plus Alpha Hydroxy Lotion  Eucerin Original Crme  Eucerin Original Lotion  Eucerin Plus Crme Eucerin Plus Lotion  Eucerin TriLipid Replenishing Lotion  Keri Anti-Bacterial Hand Lotion  Keri Deep Conditioning Original Lotion Dry Skin Formula Softly Scented  Keri Deep Conditioning Original Lotion, Fragrance Free Sensitive Skin Formula  Keri Lotion Fast Absorbing Fragrance Free Sensitive Skin Formula  Keri Lotion Fast Absorbing Softly Scented Dry Skin Formula  Keri Original Lotion  Keri Skin Renewal Lotion Keri Silky Smooth Lotion  Keri Silky Smooth Sensitive Skin Lotion  Nivea Body Creamy Conditioning Oil  Nivea Body Extra Enriched Lotion  Nivea Body Original Lotion  Nivea Body Sheer Moisturizing Lotion Nivea Crme  Nivea Skin Firming Lotion  NutraDerm 30 Skin Lotion  NutraDerm Skin Lotion  NutraDerm Therapeutic Skin Cream  NutraDerm Therapeutic Skin Lotion  ProShield Protective Hand Cream  Provon moisturizing lotion  How to Use an Incentive Spirometer  An incentive spirometer is a tool that measures how well you are filling your lungs with each breath. Learning to take long, deep breaths using this tool can help you keep your lungs clear and active. This may help to reverse or lessen your chance of developing breathing (pulmonary) problems, especially infection. You may be asked to use a spirometer: After a surgery. If you have a lung problem or a history of smoking. After a long period of time when you have been unable to move or be active. If the spirometer includes an indicator to show the highest number that you have reached, your health care provider or respiratory therapist will help you set a  goal. Keep a log of your progress as told by your health care provider. What are the risks? Breathing too quickly may cause dizziness or cause you to pass out. Take your time so you do not get dizzy or light-headed. If you are in pain, you may need to take pain medicine before doing incentive spirometry. It is harder to take a deep breath if you are having pain. How to use your incentive spirometer  Sit up on the edge of your bed or on a chair. Hold the incentive spirometer so that it is in an upright position. Before you use the spirometer, breathe out normally. Place the mouthpiece in your  mouth. Make sure your lips are closed tightly around it. Breathe in slowly and as deeply as you can through your mouth, causing the piston or the ball to rise toward the top of the chamber. Hold your breath for 3-5 seconds, or for as long as possible. If the spirometer includes a coach indicator, use this to guide you in breathing. Slow down your breathing if the indicator goes above the marked areas. Remove the mouthpiece from your mouth and breathe out normally. The piston or ball will return to the bottom of the chamber. Rest for a few seconds, then repeat the steps 10 or more times. Take your time and take a few normal breaths between deep breaths so that you do not get dizzy or light-headed. Do this every 1-2 hours when you are awake. If the spirometer includes a goal marker to show the highest number you have reached (best effort), use this as a goal to work toward during each repetition. After each set of 10 deep breaths, cough a few times. This will help to make sure that your lungs are clear. If you have an incision on your chest or abdomen from surgery, place a pillow or a rolled-up towel firmly against the incision when you cough. This can help to reduce pain while taking deep breaths and coughing. General tips When you are able to get out of bed: Walk around often. Continue to take deep breaths  and cough in order to clear your lungs. Keep using the incentive spirometer until your health care provider says it is okay to stop using it. If you have been in the hospital, you may be told to keep using the spirometer at home. Contact a health care provider if: You are having difficulty using the spirometer. You have trouble using the spirometer as often as instructed. Your pain medicine is not giving enough relief for you to use the spirometer as told. You have a fever. Get help right away if: You develop shortness of breath. You develop a cough with bloody mucus from the lungs. You have fluid or blood coming from an incision site after you cough. Summary An incentive spirometer is a tool that can help you learn to take long, deep breaths to keep your lungs clear and active. You may be asked to use a spirometer after a surgery, if you have a lung problem or a history of smoking, or if you have been inactive for a long period of time. Use your incentive spirometer as instructed every 1-2 hours while you are awake. If you have an incision on your chest or abdomen, place a pillow or a rolled-up towel firmly against your incision when you cough. This will help to reduce pain. Get help right away if you have shortness of breath, you cough up bloody mucus, or blood comes from your incision when you cough. This information is not intended to replace advice given to you by your health care provider. Make sure you discuss any questions you have with your health care provider. Document Revised: 06/06/2019 Document Reviewed: 06/06/2019 Elsevier Patient Education  2023 [employer].

## 2023-08-27 ENCOUNTER — Ambulatory Visit: Payer: Medicare Other | Admitting: Dermatology

## 2023-09-06 MED ORDER — DEXAMETHASONE SODIUM PHOSPHATE 10 MG/ML IJ SOLN
8.0000 mg | Freq: Once | INTRAMUSCULAR | Status: AC
  Administered 2023-09-07: 8 mg via INTRAVENOUS

## 2023-09-06 MED ORDER — CHLORHEXIDINE GLUCONATE 0.12 % MT SOLN
15.0000 mL | Freq: Once | OROMUCOSAL | Status: AC
  Administered 2023-09-07: 15 mL via OROMUCOSAL

## 2023-09-06 MED ORDER — SODIUM CHLORIDE 0.9 % IV SOLN: INTRAVENOUS | Status: DC

## 2023-09-06 MED ORDER — CEFAZOLIN SODIUM-DEXTROSE 2-4 GM/100ML-% IV SOLN
2.0000 g | INTRAVENOUS | Status: AC
  Administered 2023-09-07: 2 g via INTRAVENOUS

## 2023-09-06 MED ORDER — TRANEXAMIC ACID-NACL 1000-0.7 MG/100ML-% IV SOLN
1000.0000 mg | INTRAVENOUS | Status: AC
  Administered 2023-09-07 (×2): 1000 mg via INTRAVENOUS

## 2023-09-06 MED ORDER — ORAL CARE MOUTH RINSE: 15.0000 mL | Freq: Once | OROMUCOSAL | Status: AC

## 2023-09-07 ENCOUNTER — Ambulatory Visit

## 2023-09-07 ENCOUNTER — Encounter: Payer: Self-pay | Admitting: Orthopedic Surgery

## 2023-09-07 ENCOUNTER — Encounter
Admission: RE | Disposition: A | Discharge status: Home / Self Care | Payer: Self-pay | Source: Home / Self Care | Attending: Orthopedic Surgery

## 2023-09-07 ENCOUNTER — Other Ambulatory Visit: Payer: Self-pay

## 2023-09-07 ENCOUNTER — Ambulatory Visit
Admission: RE | Admit: 2023-09-07 | Discharge: 2023-09-08 | Disposition: A | Discharge status: Home / Self Care | Attending: Orthopedic Surgery | Admitting: Orthopedic Surgery

## 2023-09-07 ENCOUNTER — Ambulatory Visit: Payer: Self-pay | Admitting: Urgent Care

## 2023-09-07 DIAGNOSIS — M1612 Unilateral primary osteoarthritis, left hip: Secondary | ICD-10-CM | POA: Insufficient documentation

## 2023-09-07 DIAGNOSIS — K219 Gastro-esophageal reflux disease without esophagitis: Secondary | ICD-10-CM | POA: Diagnosis not present

## 2023-09-07 DIAGNOSIS — Z01818 Encounter for other preprocedural examination: Secondary | ICD-10-CM

## 2023-09-07 DIAGNOSIS — E119 Type 2 diabetes mellitus without complications: Secondary | ICD-10-CM | POA: Diagnosis not present

## 2023-09-07 DIAGNOSIS — I10 Essential (primary) hypertension: Secondary | ICD-10-CM | POA: Insufficient documentation

## 2023-09-07 DIAGNOSIS — Z96642 Presence of left artificial hip joint: Secondary | ICD-10-CM | POA: Diagnosis present

## 2023-09-07 DIAGNOSIS — Z7984 Long term (current) use of oral hypoglycemic drugs: Secondary | ICD-10-CM | POA: Diagnosis not present

## 2023-09-07 HISTORY — PX: TOTAL HIP ARTHROPLASTY: SHX124

## 2023-09-07 LAB — GLUCOSE, CAPILLARY
Glucose-Capillary: 140 mg/dL — ABNORMAL HIGH (ref 70–99)
Glucose-Capillary: 143 mg/dL — ABNORMAL HIGH (ref 70–99)

## 2023-09-07 LAB — ABO/RH: ABO/RH(D): O POS

## 2023-09-07 SURGERY — ARTHROPLASTY, HIP, TOTAL, ANTERIOR APPROACH
Anesthesia: Spinal | Site: Hip | Laterality: Left

## 2023-09-07 MED ORDER — GLYCOPYRROLATE 0.2 MG/ML IJ SOLN
INTRAMUSCULAR | Status: AC
  Filled 2023-09-07: qty 1

## 2023-09-07 MED ORDER — ENOXAPARIN SODIUM 40 MG/0.4ML IJ SOSY
40.0000 mg | PREFILLED_SYRINGE | INTRAMUSCULAR | Status: DC
  Administered 2023-09-08: 40 mg via SUBCUTANEOUS
  Filled 2023-09-07: qty 0.4

## 2023-09-07 MED ORDER — OXYCODONE HCL 5 MG/5ML PO SOLN: 5.0000 mg | Freq: Once | ORAL | Status: DC | PRN

## 2023-09-07 MED ORDER — PHENYLEPHRINE HCL-NACL 20-0.9 MG/250ML-% IV SOLN
INTRAVENOUS | Status: AC
  Filled 2023-09-07: qty 250

## 2023-09-07 MED ORDER — ACETAMINOPHEN 10 MG/ML IV SOLN
INTRAVENOUS | Status: AC
  Filled 2023-09-07: qty 100

## 2023-09-07 MED ORDER — TRAMADOL HCL 50 MG PO TABS
50.0000 mg | ORAL_TABLET | Freq: Four times a day (QID) | ORAL | Status: DC | PRN
  Administered 2023-09-07: 50 mg via ORAL
  Filled 2023-09-07: qty 1

## 2023-09-07 MED ORDER — KETOROLAC TROMETHAMINE 15 MG/ML IJ SOLN
7.5000 mg | Freq: Four times a day (QID) | INTRAMUSCULAR | Status: AC
  Administered 2023-09-07 – 2023-09-08 (×4): 7.5 mg via INTRAVENOUS
  Filled 2023-09-07 (×4): qty 1

## 2023-09-07 MED ORDER — PANTOPRAZOLE SODIUM 40 MG PO TBEC
40.0000 mg | DELAYED_RELEASE_TABLET | Freq: Every day | ORAL | Status: DC
  Administered 2023-09-08: 40 mg via ORAL
  Filled 2023-09-07: qty 1

## 2023-09-07 MED ORDER — PROPOFOL 1000 MG/100ML IV EMUL
INTRAVENOUS | Status: AC
  Filled 2023-09-07: qty 100

## 2023-09-07 MED ORDER — ONDANSETRON HCL 4 MG/2ML IJ SOLN
INTRAMUSCULAR | Status: AC
  Filled 2023-09-07: qty 2

## 2023-09-07 MED ORDER — PROPOFOL 1000 MG/100ML IV EMUL
INTRAVENOUS | Status: AC
Start: 2023-09-07 — End: ?
  Filled 2023-09-07: qty 100

## 2023-09-07 MED ORDER — PROPOFOL 10 MG/ML IV BOLUS
INTRAVENOUS | Status: DC | PRN
  Administered 2023-09-07: 150 ug/kg/min via INTRAVENOUS
  Administered 2023-09-07: 30 mg via INTRAVENOUS

## 2023-09-07 MED ORDER — BUPIVACAINE HCL (PF) 0.5 % IJ SOLN
INTRAMUSCULAR | Status: DC | PRN
  Administered 2023-09-07: 3 mL

## 2023-09-07 MED ORDER — SODIUM CHLORIDE (PF) 0.9 % IJ SOLN
INTRAMUSCULAR | Status: DC | PRN
  Administered 2023-09-07: 50 mL

## 2023-09-07 MED ORDER — SODIUM CHLORIDE 0.9 % IR SOLN
Status: DC | PRN
  Administered 2023-09-07: 500 mL

## 2023-09-07 MED ORDER — PHENYLEPHRINE HCL-NACL 20-0.9 MG/250ML-% IV SOLN
INTRAVENOUS | Status: DC | PRN
  Administered 2023-09-07: 30 ug/min via INTRAVENOUS

## 2023-09-07 MED ORDER — 0.9 % SODIUM CHLORIDE (POUR BTL) OPTIME
TOPICAL | Status: DC | PRN
  Administered 2023-09-07: 120 mL

## 2023-09-07 MED ORDER — DOCUSATE SODIUM 100 MG PO CAPS
100.0000 mg | ORAL_CAPSULE | Freq: Two times a day (BID) | ORAL | Status: DC
  Administered 2023-09-07: 100 mg via ORAL
  Filled 2023-09-07 (×2): qty 1

## 2023-09-07 MED ORDER — HYDROCODONE-ACETAMINOPHEN 5-325 MG PO TABS
1.0000 | ORAL_TABLET | ORAL | Status: DC | PRN
  Administered 2023-09-07: 1 via ORAL
  Filled 2023-09-07: qty 1

## 2023-09-07 MED ORDER — GLYCOPYRROLATE 0.2 MG/ML IJ SOLN
INTRAMUSCULAR | Status: DC | PRN
Start: 2023-09-07 — End: 2023-09-07
  Administered 2023-09-07: .1 mg via INTRAVENOUS

## 2023-09-07 MED ORDER — ACETAMINOPHEN 500 MG PO TABS
1000.0000 mg | ORAL_TABLET | Freq: Three times a day (TID) | ORAL | Status: DC
  Administered 2023-09-07 (×2): 1000 mg via ORAL
  Administered 2023-09-08: 500 mg via ORAL
  Filled 2023-09-07 (×3): qty 2

## 2023-09-07 MED ORDER — DEXAMETHASONE SODIUM PHOSPHATE 10 MG/ML IJ SOLN
INTRAMUSCULAR | Status: AC
  Filled 2023-09-07: qty 1

## 2023-09-07 MED ORDER — CEFAZOLIN SODIUM-DEXTROSE 2-4 GM/100ML-% IV SOLN
INTRAVENOUS | Status: AC
  Filled 2023-09-07: qty 100

## 2023-09-07 MED ORDER — ASPIRIN 81 MG PO TBEC
81.0000 mg | DELAYED_RELEASE_TABLET | Freq: Every day | ORAL | Status: DC
  Administered 2023-09-08: 81 mg via ORAL
  Filled 2023-09-07: qty 1

## 2023-09-07 MED ORDER — ONDANSETRON HCL 4 MG/2ML IJ SOLN
INTRAMUSCULAR | Status: DC | PRN
  Administered 2023-09-07: 4 mg via INTRAVENOUS

## 2023-09-07 MED ORDER — OXYCODONE HCL 5 MG PO TABS: 5.0000 mg | ORAL_TABLET | Freq: Once | ORAL | Status: DC | PRN

## 2023-09-07 MED ORDER — FENTANYL CITRATE (PF) 100 MCG/2ML IJ SOLN
INTRAMUSCULAR | Status: AC
  Filled 2023-09-07: qty 2

## 2023-09-07 MED ORDER — SIMVASTATIN 20 MG PO TABS
20.0000 mg | ORAL_TABLET | Freq: Every day | ORAL | Status: DC
  Administered 2023-09-08: 20 mg via ORAL
  Filled 2023-09-07: qty 1

## 2023-09-07 MED ORDER — MORPHINE SULFATE (PF) 2 MG/ML IV SOLN: 0.5000 mg | INTRAVENOUS | Status: DC | PRN

## 2023-09-07 MED ORDER — PROPOFOL 10 MG/ML IV BOLUS
INTRAVENOUS | Status: AC
  Filled 2023-09-07: qty 20

## 2023-09-07 MED ORDER — ACETAMINOPHEN 325 MG PO TABS: 325.0000 mg | ORAL_TABLET | Freq: Four times a day (QID) | ORAL | Status: DC | PRN

## 2023-09-07 MED ORDER — PHENYLEPHRINE 80 MCG/ML (10ML) SYRINGE FOR IV PUSH (FOR BLOOD PRESSURE SUPPORT)
PREFILLED_SYRINGE | INTRAVENOUS | Status: DC | PRN
  Administered 2023-09-07 (×3): 80 ug via INTRAVENOUS

## 2023-09-07 MED ORDER — TRANEXAMIC ACID-NACL 1000-0.7 MG/100ML-% IV SOLN
INTRAVENOUS | Status: AC
  Filled 2023-09-07: qty 100

## 2023-09-07 MED ORDER — ACETAMINOPHEN 10 MG/ML IV SOLN
INTRAVENOUS | Status: DC | PRN
  Administered 2023-09-07: 1000 mg via INTRAVENOUS

## 2023-09-07 MED ORDER — BUPIVACAINE LIPOSOME 1.3 % IJ SUSP
INTRAMUSCULAR | Status: AC
Start: 2023-09-07 — End: ?
  Filled 2023-09-07: qty 20

## 2023-09-07 MED ORDER — SODIUM CHLORIDE 0.9 % IV SOLN: INTRAVENOUS | Status: DC

## 2023-09-07 MED ORDER — LOSARTAN POTASSIUM 50 MG PO TABS
50.0000 mg | ORAL_TABLET | Freq: Every day | ORAL | Status: DC
  Administered 2023-09-08: 50 mg via ORAL
  Filled 2023-09-07: qty 1

## 2023-09-07 MED ORDER — METOCLOPRAMIDE HCL 5 MG/ML IJ SOLN: 5.0000 mg | Freq: Three times a day (TID) | INTRAMUSCULAR | Status: DC | PRN

## 2023-09-07 MED ORDER — ONDANSETRON HCL 4 MG/2ML IJ SOLN: 4.0000 mg | Freq: Four times a day (QID) | INTRAMUSCULAR | Status: DC | PRN

## 2023-09-07 MED ORDER — PHENOL 1.4 % MT LIQD: 1.0000 | OROMUCOSAL | Status: DC | PRN

## 2023-09-07 MED ORDER — MENTHOL 3 MG MT LOZG: 1.0000 | LOZENGE | OROMUCOSAL | Status: DC | PRN

## 2023-09-07 MED ORDER — SURGIPHOR WOUND IRRIGATION SYSTEM - OPTIME
TOPICAL | Status: DC | PRN
  Administered 2023-09-07: 450 mL

## 2023-09-07 MED ORDER — CEFAZOLIN SODIUM-DEXTROSE 2-4 GM/100ML-% IV SOLN
2.0000 g | Freq: Four times a day (QID) | INTRAVENOUS | Status: AC
  Administered 2023-09-07 (×2): 2 g via INTRAVENOUS
  Filled 2023-09-07 (×2): qty 100

## 2023-09-07 MED ORDER — LIDOCAINE HCL (PF) 2 % IJ SOLN
INTRAMUSCULAR | Status: AC
  Filled 2023-09-07: qty 5

## 2023-09-07 MED ORDER — BUPIVACAINE-EPINEPHRINE (PF) 0.25% -1:200000 IJ SOLN
INTRAMUSCULAR | Status: AC
  Filled 2023-09-07: qty 30

## 2023-09-07 MED ORDER — FENTANYL CITRATE (PF) 100 MCG/2ML IJ SOLN: 25.0000 ug | INTRAMUSCULAR | Status: DC | PRN

## 2023-09-07 MED ORDER — METOCLOPRAMIDE HCL 10 MG PO TABS: 5.0000 mg | ORAL_TABLET | Freq: Three times a day (TID) | ORAL | Status: DC | PRN

## 2023-09-07 MED ORDER — BUPIVACAINE HCL (PF) 0.5 % IJ SOLN
INTRAMUSCULAR | Status: AC
  Filled 2023-09-07: qty 10

## 2023-09-07 MED ORDER — ONDANSETRON HCL 4 MG PO TABS: 4.0000 mg | ORAL_TABLET | Freq: Four times a day (QID) | ORAL | Status: DC | PRN

## 2023-09-07 MED ORDER — SODIUM CHLORIDE (PF) 0.9 % IJ SOLN
INTRAMUSCULAR | Status: AC
  Filled 2023-09-07: qty 10

## 2023-09-07 MED ORDER — FENTANYL CITRATE (PF) 100 MCG/2ML IJ SOLN
INTRAMUSCULAR | Status: DC | PRN
  Administered 2023-09-07 (×2): 25 ug via INTRAVENOUS

## 2023-09-07 MED ORDER — CHLORHEXIDINE GLUCONATE 0.12 % MT SOLN
OROMUCOSAL | Status: AC
  Filled 2023-09-07: qty 15

## 2023-09-07 SURGICAL SUPPLY — 59 items
BLADE CLIPPER SURG (BLADE) IMPLANT
BLADE SAGITTAL AGGR TOOTH XLG (BLADE) ×1 IMPLANT
BNDG COHESIVE 6X5 TAN ST LF (GAUZE/BANDAGES/DRESSINGS) ×2 IMPLANT
BRUSH SCRUB EZ PLAIN DRY (MISCELLANEOUS) ×1 IMPLANT
CHLORAPREP W/TINT 26 (MISCELLANEOUS) ×1 IMPLANT
DERMABOND ADVANCED .7 DNX12 (GAUZE/BANDAGES/DRESSINGS) ×1 IMPLANT
DRAPE C-ARM XRAY 36X54 (DRAPES) ×1 IMPLANT
DRAPE SHEET LG 3/4 BI-LAMINATE (DRAPES) ×2 IMPLANT
DRAPE TABLE BACK 80X90 (DRAPES) ×1 IMPLANT
DRSG MEPILEX SACRM 8.7X9.8 (GAUZE/BANDAGES/DRESSINGS) ×1 IMPLANT
DRSG OPSITE POSTOP 4X8 (GAUZE/BANDAGES/DRESSINGS) ×1 IMPLANT
ELECTRODE BLDE 4.0 EZ CLN MEGD (MISCELLANEOUS) ×1 IMPLANT
ELECTRODE REM PT RTRN 9FT ADLT (ELECTROSURGICAL) ×1 IMPLANT
GLOVE BIO SURGEON STRL SZ8 (GLOVE) ×1 IMPLANT
GLOVE BIOGEL PI IND STRL 8 (GLOVE) ×1 IMPLANT
GLOVE PI ORTHO PRO STRL 7.5 (GLOVE) ×2 IMPLANT
GLOVE PI ORTHO PRO STRL SZ8 (GLOVE) ×2 IMPLANT
GLOVE SURG SYN 7.5 E (GLOVE) ×1 IMPLANT
GLOVE SURG SYN 7.5 PF PI (GLOVE) ×1 IMPLANT
GOWN SRG XL LVL 3 NONREINFORCE (GOWNS) ×1 IMPLANT
GOWN STRL REUS W/ TWL LRG LVL3 (GOWN DISPOSABLE) ×1 IMPLANT
GOWN STRL REUS W/ TWL XL LVL3 (GOWN DISPOSABLE) ×1 IMPLANT
HEAD FEM TAPER UNIV 4 (Orthopedic Implant) IMPLANT
HOOD PEEL AWAY T7 (MISCELLANEOUS) ×2 IMPLANT
INSERT 0D POLY 40 SZ F (Insert) IMPLANT
IV NS 100ML SINGLE PACK (IV SOLUTION) ×1 IMPLANT
KIT PATIENT CARE HANA TABLE (KITS) ×1 IMPLANT
KIT TURNOVER CYSTO (KITS) ×1 IMPLANT
LIGHT WAVEGUIDE WIDE FLAT (MISCELLANEOUS) ×1 IMPLANT
MANIFOLD NEPTUNE II (INSTRUMENTS) ×1 IMPLANT
MARKER SKIN DUAL TIP RULER LAB (MISCELLANEOUS) ×1 IMPLANT
MAT ABSORB FLUID 56X50 GRAY (MISCELLANEOUS) ×1 IMPLANT
NDL SPNL 20GX3.5 QUINCKE YW (NEEDLE) ×1 IMPLANT
NEEDLE SPNL 20GX3.5 QUINCKE YW (NEEDLE) ×1 IMPLANT
NS IRRIG 500ML POUR BTL (IV SOLUTION) ×1 IMPLANT
PACK HIP COMPR (MISCELLANEOUS) ×1 IMPLANT
PAD ARMBOARD POSITIONER FOAM (MISCELLANEOUS) ×1 IMPLANT
PENCIL SMOKE EVACUATOR (MISCELLANEOUS) ×1 IMPLANT
SCREW HEX LP 6.5X15 (Screw) IMPLANT
SCREW HEX LP 6.5X25 (Screw) IMPLANT
SHELL TRIDENT II CLUST SZ 56MM (Shell) IMPLANT
SLEEVE FEM ADAPTER V-40 +0 (Sleeve) IMPLANT
SLEEVE SCD COMPRESS KNEE MED (STOCKING) ×1 IMPLANT
SOLUTION IRRIG SURGIPHOR (IV SOLUTION) ×1 IMPLANT
STEM STD OFFSET SZ3 32.5 (Stem) IMPLANT
SURGIFLO W/THROMBIN 8M KIT (HEMOSTASIS) IMPLANT
SUT BONE WAX W31G (SUTURE) ×1 IMPLANT
SUT ETHIBOND 2 V 37 (SUTURE) ×1 IMPLANT
SUT SILK 0 30XBRD TIE 6 (SUTURE) ×1 IMPLANT
SUT STRATAFIX 14 PDO 36 VLT (SUTURE) ×1 IMPLANT
SUT VIC AB 0 CT1 36 (SUTURE) ×1 IMPLANT
SUT VIC AB 2-0 CT2 27 (SUTURE) ×1 IMPLANT
SUTURE STRATA SPIR 4-0 18 (SUTURE) ×1 IMPLANT
SYR 20ML LL LF (SYRINGE) ×2 IMPLANT
TAPE MICROFOAM 4IN (TAPE) IMPLANT
TOWEL OR 17X26 4PK STRL BLUE (TOWEL DISPOSABLE) IMPLANT
TRAP FLUID SMOKE EVACUATOR (MISCELLANEOUS) ×1 IMPLANT
WAND WEREWOLF FASTSEAL 6.0 (MISCELLANEOUS) ×1 IMPLANT
WATER STERILE IRR 1000ML POUR (IV SOLUTION) ×1 IMPLANT

## 2023-09-07 NOTE — Plan of Care (Signed)
Problem: Education: Goal: Knowledge of the prescribed therapeutic regimen will improve Outcome: Progressing Goal: Understanding of discharge needs will improve Outcome: Progressing   Problem: Activity: Goal: Ability to avoid complications of mobility impairment will improve Outcome: Progressing Goal: Ability to tolerate increased activity will improve Outcome: Progressing   Problem: Clinical Measurements: Goal: Postoperative complications will be avoided or minimized Outcome: Progressing   Problem: Pain Management: Goal: Pain level will decrease with appropriate interventions Outcome: Progressing   Problem: Skin Integrity: Goal: Will show signs of wound healing Outcome: Progressing   

## 2023-09-07 NOTE — Interval H&P Note (Signed)
Patient history and physical updated. Consent reviewed including risks, benefits, and alternatives to surgery. Patient agrees with above plan to proceed with left anterior total hip arthroplasty  

## 2023-09-07 NOTE — H&P (Signed)
 History of Present Illness: Todd Farley is an 78 y.o. male who presents for follow-up evaluation of his left hip prior to planned left total hip replacement. Patient reports ongoing pain radiating down his left thigh and groin 5 out of 10 which worsens with walking turning and pivoting. Is functionally limiting him with walking on a daily basis and preventing him from participating in travel and gardening activities. He has done physical therapy to work on strength, gait and balance. He is here today for follow-up evaluation prior to planned left total hip replacement. The patient denies fevers, chills, numbness, tingling, shortness of breath, chest pain, recent illness, or any trauma.  Of note the patient was being worked up by pulmonology and cardiothoracic surgery for a right lower lobe bronchial trunk scarring. He may end up needing a partial lobectomy but was told by Dr. Luna Salinas to move forward with the hip first.  Patient is a non-smoker, well-controlled diabetic with an A1c of 6.8 and a BMI of 27.7 Past Medical History: Past Medical History:  Diagnosis Date  Arthritis 20+ years ago  Joint pain - especially hands and feet  COPD (chronic obstructive pulmonary disease) (CMS/HHS-HCC) Most of my life  Diabetes mellitus type 2, uncomplicated (CMS/HHS-HCC)  GERD (gastroesophageal reflux disease)  History of cataract Removed  Hyperlipidemia 15+ years  controlled with Simvastatin  Hypertension   Past Surgical History: Past Surgical History:  Procedure Laterality Date  EGD 06/25/2015  No repeat per RTE  COLONOSCOPY 06/27/2015  Int Hemorrhoids, Diverticulosis: CBF 05/2025  BLEPHAROPLASTY UPPER EYELID Left  CATARACT EXTRACTION Bilateral  FRACTURE SURGERY Left arm as a child  HERNIA REPAIR  JOINT REPLACEMENT  KNEE ARTHROSCOPY  Right Carpal Tunnel Release Right  TONSILLECTOMY 3rd grade (~1956)   Past Family History: Family History  Problem Relation Age of Onset  Lymphoma Mother   Breast cancer Mother  Stroke Father   Medications: Current Outpatient Medications  Medication Sig Dispense Refill  albuterol MDI, PROVENTIL, VENTOLIN, PROAIR, HFA 90 mcg/actuation inhaler Inhale 2 inhalations into the lungs every 6 (six) hours as needed for Wheezing (Patient not taking: Reported on 06/12/2023) 1 each 0  amoxicillin (AMOXIL) 875 MG tablet as needed (For Dental)  aspirin 81 MG EC tablet Take 81 mg by mouth once daily  betamethasone dipropionate (DIPROSONE) 0.05 % ointment Apply topically 2 (two) times daily.  cyanocobalamin (VITAMIN B12) 1000 MCG tablet Take 1,000 mcg by mouth once daily  FERROUS SULFATE ORAL Take 325 mg by mouth daily with breakfast 60mg   fexofenadine (ALLEGRA) 180 MG tablet Take 180 mg by mouth every evening  fluocinonide (LIDEX) 0.05 % gel Apply topically once daily.  folic acid (FOLVITE) 400 MCG tablet Take 800 mcg by mouth once daily  glipiZIDE (GLUCOTROL) 5 MG tablet TAKE 1 TABLET BY MOUTH IN THE MORNING BEFORE BREAKFAST 90 tablet 3  ibuprofen (ADVIL,MOTRIN) 600 MG tablet Take by mouth once daily  ipratropium (ATROVENT) 0.06 % nasal spray USE 2 SPRAYS IN EACH NOSTRIL EVERY 8 HOURS AS NEEDED FOR RUNNY NOSE  loratadine (CLARITIN) 10 mg capsule Take 10 mg by mouth once daily  losartan (COZAAR) 50 MG tablet TAKE 1 TABLET BY MOUTH ONCE DAILY 90 tablet 3  metFORMIN (GLUCOPHAGE) 1000 MG tablet TAKE 1 TABLET BY MOUTH TWICE DAILY WITH MEALS 180 tablet 3  multivitamin tablet Take 1 tablet by mouth once daily.  omeprazole (PRILOSEC) 40 MG DR capsule TAKE 1 CAPSULE BY MOUTH ONCE DAILY 90 capsule 3  ONETOUCH VERIO test strip Use 1 each  once daily. 0  simvastatin (ZOCOR) 20 MG tablet TAKE 1 TABLET BY MOUTH AT BEDTIME 90 tablet 3  triamcinolone 0.1 % ointment Apply 1 Application topically   No current facility-administered medications for this visit.   Allergies: Allergies  Allergen Reactions  Budesonide-Formoterol Other (See Comments) and Shortness Of Breath   FLU LIKE SYMPTOMS, SHORTNESS OF BREATH  Other Hives and Shortness Of Breath  Siamese Cats    Visit Vitals: Vitals:  09/02/23 1127  BP: 126/82    Review of Systems:  A comprehensive 14 point ROS was performed, reviewed, and the pertinent orthopaedic findings are documented in the HPI.  Physical Exam: Body mass index is 27.44 kg/m. General/Constitutional: No apparent distress: well-nourished and well developed. Lymphatic: No palpable adenopathy. Pulmonary exam: Lungs clear to auscultation bilaterally no wheezing rales or rhonchi Cardiac exam: Regular rate and rhythm no obvious murmurs rubs or gallops. Vascular: No edema, swelling or tenderness, except as noted in detailed exam. Integumentary: No impressive skin lesions present, except as noted in detailed exam. Neuro/Psych: Normal mood and affect, oriented to person, place and time. Musculoskeletal: Normal, except as noted in detailed exam and in HPI. Left hip exam  SKIN: intact SWELLING: none WARMTH: no warmth TENDERNESS: none, Stinchfield Positive ROM: 0 degrees internal rotation and 30 degrees external rotation and pain with internal rotation,; Hip Flexion 90 STRENGTH: normal GAIT: stiff-legged STABILITY: stable to testing CREPITUS: yes LEG LENGTH DISCREPANCY: right longer by .2 cm NEUROLOGICAL EXAM: normal VASCULAR EXAM: normal LUMBAR SPINE: tenderness: no straight leg raising sign: no motor exam: normal  The contralateral hip was examined for comparison and it showed: TENDERNESS: none ROM: normal and full STRENGTH: normal STABILITY: stable to testing  Hip Imaging :  I reviewed AP pelvis and lateral left hip x-rays performed on 05/13/2023 images reviewed by myself. The left hip shows severe degenerative changes with complete obliteration of the superior joint space with bone-on-bone articulation, osteophyte formation, femoral head deformity cystic changes. The right hip shows mild deformity with cam lesion and  inferior joint space narrowing sclerosis and osteophyte formation. No fractures or dislocations noted about either hip or the pelvis.  Assessment:  Left hip osteoarthritis  Plan: Todd Farley is a 78 year old male presents with left hip bone on bone arthritis. Based upon the patient's continued symptoms and failure to respond to conservative treatment, I have recommended a left total hip replacement for this patient. A long discussion took place with the patient describing what a total joint replacement is and what the procedure would entail. A hip model, similar to the implants that will be used during the operation, was utilized to demonstrate the implants. Choices of implant manufactures were discussed and reviewed. The ability to secure the implant utilizing cement or cementless (press fit) fixation was discussed. Anterior and posterior exposures were discussed. For this patient an appropriate approach will be anterior.   The hospitalization and post-operative care and rehabilitation were also discussed. The use of perioperative antibiotics and DVT prophylaxis were discussed. The risk, benefits and alternatives to a surgical intervention were discussed at length with the patient. The patient was also advised of risks related to the medical comorbidities and elevated body mass index (BMI). A lengthy discussion took place to review the most common complications including but not limited to: deep vein thrombosis, pulmonary embolus, heart attack, stroke, infection, wound breakdown, heterotopic ossification, dislocation, numbness, leg length in-equality, intraoperative fracture, damage to nerves, tendon,muscles, arteries or other blood vessels, death and other possible complications from anesthesia. The  patient was told that we will take steps to minimize these risks by using sterile technique, antibiotics and DVT prophylaxis when appropriate and follow the patient postoperatively in the office setting to monitor  progress. The possibility of recurrent pain, no improvement in pain and actual worsening of pain were also discussed with the patient. The risk of dislocation following total hip replacement was discussed and potential precautions to prevent dislocation were reviewed.   Patient asked about and confirms no history of any reactions to metal or metal allergy in the past.  The discharge plan of care focused on the patient going home following surgery. The patient was encouraged to make the necessary arrangements to have someone stay with them when they are discharged home.   The benefits of surgery were discussed with the patient including the potential for improving the patient's current clinical condition through operative intervention. Alternatives to surgical intervention including continued conservative management were also discussed in detail. All questions were answered to the satisfaction of the patient. The patient participated and agreed to the plan of care as well as the use of the recommended implants for their total hip replacement surgery. An information packet was given to the patient to review prior to surgery.   Patient received medical and pulmonary/cardiothoracic clearance for the surgery. All questions answered patient agrees with plan for left total hip arthroplasty.    Portions of this record have been created using Scientist, clinical (histocompatibility and immunogenetics). Dictation errors have been sought, but may not have been identified and corrected.  Todd Ginsberg MD

## 2023-09-07 NOTE — Evaluation (Signed)
 Physical Therapy Evaluation Patient Details Name: Todd Farley MRN: [id number] DOB: February 25, 1946 Today's Date: 09/07/2023  History of Present Illness  Pt is a 78 yo male s/p L THA. PMH of SOB, former smoking, HTN, DM, GERD, skin cancer, R bronchial stenosis, R TKA.  Clinical Impression  Patient alert, agreeable to PT, reported 5/10 L hip pain. At baseline the pt lives with family, has extensive DME at home, modI-I for ADLs/IADLs. Did report increasing weakness/fatigue/BLE for the last year. He was able to perform a few exercises with verbal cues/tactile cues. Intermittently needed redirection to task. Supervision for bed mobility, but CGAx2 for transfers and step pivot to recliner with CGA-minAx2 with RW. Noted for intermittent posterior lean, pt stated feeling as if he sensation had not fully returned.  Overall the patient demonstrated deficits (see "PT Problem List") that impede the patient's functional abilities, safety, and mobility and would benefit from skilled PT intervention.          If plan is discharge home, recommend the following: A little help with walking and/or transfers;A little help with bathing/dressing/bathroom;Assistance with cooking/housework;Assist for transportation;Help with stairs or ramp for entrance;Direct supervision/assist for medications management   Can travel by private vehicle        Equipment Recommendations None recommended by PT  Recommendations for Other Services       Functional Status Assessment Patient has had a recent decline in their functional status and demonstrates the ability to make significant improvements in function in a reasonable and predictable amount of time.     Precautions / Restrictions Precautions Precautions: Fall;Anterior Hip Precaution Booklet Issued: Yes (comment) Recall of Precautions/Restrictions: Intact Restrictions Weight Bearing Restrictions Per Provider Order: Yes LLE Weight Bearing Per Provider Order: Weight bearing  as tolerated      Mobility  Bed Mobility Overal bed mobility: Needs Assistance Bed Mobility: Supine to Sit     Supine to sit: Supervision, Used rails, HOB elevated     General bed mobility comments: extra time    Transfers Overall transfer level: Needs assistance Equipment used: Rolling walker (2 wheels) Transfers: Sit to/from Stand, Bed to chair/wheelchair/BSC Sit to Stand: Contact guard assist, +2 safety/equipment   Step pivot transfers: Min assist, Contact guard assist, +2 safety/equipment       General transfer comment: effortful but able to stand with CGA. intermittent posterior lean noted with step pivot to recliner. pt endorsed spinal not completely resolved    Ambulation/Gait               General Gait Details: deferred for safety concerns  Stairs            Wheelchair Mobility     Tilt Bed    Modified Rankin (Stroke Patients Only)       Balance Overall balance assessment: Needs assistance Sitting-balance support: Feet supported Sitting balance-Leahy Scale: Good     Standing balance support: Bilateral upper extremity supported Standing balance-Leahy Scale: Poor                               Pertinent Vitals/Pain Pain Assessment Pain Assessment: 0-10 Pain Score: 5  Pain Location: L hip Pain Descriptors / Indicators: Aching, Sore Pain Intervention(s): Limited activity within patient's tolerance, Monitored during session, Repositioned    Home Living Family/patient expects to be discharged to:: Private residence Living Arrangements: Spouse/significant other Available Help at Discharge: Family Type of Home: House Home Access: Stairs to enter Entrance Stairs-Rails:  Can reach both Entrance Stairs-Number of Steps: 3   Home Layout: One level Home Equipment: Agricultural consultant (2 wheels);Cane - single point;Tub bench      Prior Function Prior Level of Function : Independent/Modified Independent                      Extremity/Trunk Assessment   Upper Extremity Assessment Upper Extremity Assessment: Overall WFL for tasks assessed    Lower Extremity Assessment Lower Extremity Assessment:  (able to lift BLE against gravity, more effortful for LLE s/p THA)       Communication        Cognition Arousal: Alert Behavior During Therapy: WFL for tasks assessed/performed   PT - Cognitive impairments: No apparent impairments                                 Cueing       General Comments      Exercises Total Joint Exercises Ankle Circles/Pumps: AROM, Both, 10 reps Heel Slides: AROM, Both, 10 reps Long Arc Quad: AROM, Both, 10 reps   Assessment/Plan    PT Assessment Patient needs continued PT services  PT Problem List Decreased strength;Decreased range of motion;Cardiopulmonary status limiting activity;Decreased activity tolerance;Decreased balance;Decreased knowledge of use of DME;Decreased mobility;Decreased knowledge of precautions       PT Treatment Interventions DME instruction;Balance training;Gait training;Neuromuscular re-education;Functional mobility training;Stair training;Patient/family education;Therapeutic activities;Therapeutic exercise    PT Goals (Current goals can be found in the Care Plan section)  Acute Rehab PT Goals Patient Stated Goal: to get stronger PT Goal Formulation: With patient Time For Goal Achievement: 09/21/23 Potential to Achieve Goals: Good    Frequency BID     Co-evaluation               AM-PAC PT "6 Clicks" Mobility  Outcome Measure Help needed turning from your back to your side while in a flat bed without using bedrails?: A Little Help needed moving from lying on your back to sitting on the side of a flat bed without using bedrails?: A Little Help needed moving to and from a bed to a chair (including a wheelchair)?: A Little Help needed standing up from a chair using your arms (e.g., wheelchair or bedside chair)?: A  Little Help needed to walk in hospital room?: A Little Help needed climbing 3-5 steps with a railing? : A Lot 6 Click Score: 17    End of Session Equipment Utilized During Treatment: Gait belt Activity Tolerance: Patient tolerated treatment well Patient left: in chair;with call bell/phone within reach Nurse Communication: Mobility status PT Visit Diagnosis: Other abnormalities of gait and mobility (R26.89);Difficulty in walking, not elsewhere classified (R26.2);Muscle weakness (generalized) (M62.81);Pain Pain - Right/Left: Left Pain - part of body: Hip    Time: 9147-8295 PT Time Calculation (min) (ACUTE ONLY): 20 min   Charges:   PT Evaluation $PT Eval Low Complexity: 1 Low PT Treatments $Therapeutic Activity: 8-22 mins PT General Charges $$ ACUTE PT VISIT: 1 Visit        Darien Eden PT, DPT 4:06 PM,09/07/23

## 2023-09-07 NOTE — Discharge Instructions (Signed)
 [facility] They will call you to set up when they are coming out to see you   [address], [place], [place] [address] Hours:  Open ? Closes 5?PM Phone: [phone]  ANTERIOR APPROACH TOTAL HIP REPLACEMENT POSTOPERATIVE DIRECTIONS   Hip Rehabilitation, Guidelines Following Surgery  The results of a hip operation are greatly improved after range of motion and muscle strengthening exercises. Follow all safety measures which are given to protect your hip. If any of these exercises cause increased pain or swelling in your joint, decrease the amount until you are comfortable again. Then slowly increase the exercises. Call your caregiver if you have problems or questions.   HOME CARE INSTRUCTIONS  Remove items at home which could result in a fall. This includes throw rugs or furniture in walking pathways.  ICE to the affected hip every three hours for 30 minutes at a time and then as needed for pain and swelling.  Continue to use ice on the hip for pain and swelling from surgery. You may notice swelling that will progress down to the foot and ankle.  This is normal after surgery.  Elevate the leg when you are not up walking on it.   Continue to use the breathing machine which will help keep your temperature down.  It is common for your temperature to cycle up and down following surgery, especially at night when you are not up moving around and exerting yourself.  The breathing machine keeps your lungs expanded and your temperature down. Do not place pillow under knee, focus on keeping the knee straight while resting  DIET You may resume your previous home diet once your are discharged from the hospital.  DRESSING / WOUND CARE / SHOWERING You may change your dressing 3-5 days after surgery.  Then change the dressing every day with sterile gauze.  Please use good hand washing techniques before changing the dressing.  Do not use any lotions or creams on the incision until instructed by your  surgeon. Keep your dressing dry with showering.  You can keep it covered and pat dry. Change the surgical dressing daily and reapply a dry dressing each time.  ACTIVITY Walk with your walker as instructed. Use walker as long as suggested by your caregivers. Avoid periods of inactivity such as sitting longer than an hour when not asleep. This helps prevent blood clots.  You may resume a sexual relationship in one month or when given the OK by your doctor.  You may return to work once you are cleared by your doctor.  Do not drive a car for 6 weeks or until released by you surgeon.  Do not drive while taking narcotics.  WEIGHT BEARING Weight bearing as tolerated with assist device (walker, cane, etc) as directed, use it as long as suggested by your surgeon or therapist, typically at least 4-6 weeks.  POSTOPERATIVE CONSTIPATION PROTOCOL Constipation - defined medically as fewer than three stools per week and severe constipation as less than one stool per week.  One of the most common issues patients have following surgery is constipation.  Even if you have a regular bowel pattern at home, your normal regimen is likely to be disrupted due to multiple reasons following surgery.  Combination of anesthesia, postoperative narcotics, change in appetite and fluid intake all can affect your bowels.  In order to avoid complications following surgery, here are some recommendations in order to help you during your recovery period.  Colace (docusate) - Pick up an over-the-counter  form of Colace or another stool softener and take twice a day as long as you are requiring postoperative pain medications.  Take with a full glass of water daily.  If you experience loose stools or diarrhea, hold the colace until you stool forms back up.  If your symptoms do not get better within 1 week or if they get worse, check with your doctor.  Dulcolax (bisacodyl) - Pick up over-the-counter and take as directed by the product  packaging as needed to assist with the movement of your bowels.  Take with a full glass of water.  Use this product as needed if not relieved by Colace only.   MiraLax (polyethylene glycol) - Pick up over-the-counter to have on hand.  MiraLax is a solution that will increase the amount of water in your bowels to assist with bowel movements.  Take as directed and can mix with a glass of water, juice, soda, coffee, or tea.  Take if you go more than two days without a movement. Do not use MiraLax more than once per day. Call your doctor if you are still constipated or irregular after using this medication for 7 days in a row.  If you continue to have problems with postoperative constipation, please contact the office for further assistance and recommendations.  If you experience "the worst abdominal pain ever" or develop nausea or vomiting, please contact the office immediatly for further recommendations for treatment.  ITCHING  If you experience itching with your medications, try taking only a single pain pill, or even half a pain pill at a time.  You can also use Benadryl over the counter for itching or also to help with sleep.   TED HOSE STOCKINGS Wear the elastic stockings on both legs for three weeks following surgery during the day but you may remove then at night for sleeping.  MEDICATIONS See your medication summary on the "After Visit Summary" that the nursing staff will review with you prior to discharge.  You may have some home medications which will be placed on hold until you complete the course of blood thinner medication.  It is important for you to complete the blood thinner medication as prescribed by your surgeon.  Continue your approved medications as instructed at time of discharge.  PRECAUTIONS If you experience chest pain or shortness of breath - call 911 immediately for transfer to the hospital emergency department.  If you develop a fever greater that 101 F, purulent drainage  from wound, increased redness or drainage from wound, foul odor from the wound/dressing, or calf pain - CONTACT YOUR SURGEON.                                                   FOLLOW-UP APPOINTMENTS Make sure you keep all of your appointments after your operation with your surgeon and caregivers. You should call the office at the above phone number and make an appointment for approximately two weeks after the date of your surgery or on the date instructed by your surgeon outlined in the "After Visit Summary".  RANGE OF MOTION AND STRENGTHENING EXERCISES  These exercises are designed to help you keep full movement of your hip joint. Follow your caregiver's or physical therapist's instructions. Perform all exercises about fifteen times, three times per day or as directed. Exercise both hips, even if you  have had only one joint replacement. These exercises can be done on a training (exercise) mat, on the floor, on a table or on a bed. Use whatever works the best and is most comfortable for you. Use music or television while you are exercising so that the exercises are a pleasant break in your day. This will make your life better with the exercises acting as a break in routine you can look forward to.  Lying on your back, slowly slide your foot toward your buttocks, raising your knee up off the floor. Then slowly slide your foot back down until your leg is straight again.  Lying on your back spread your legs as far apart as you can without causing discomfort.  Lying on your side, raise your upper leg and foot straight up from the floor as far as is comfortable. Slowly lower the leg and repeat.  Lying on your back, tighten up the muscle in the front of your thigh (quadriceps muscles). You can do this by keeping your leg straight and trying to raise your heel off the floor. This helps strengthen the largest muscle supporting your knee.  Lying on your back, tighten up the muscles of your buttocks both with the  legs straight and with the knee bent at a comfortable angle while keeping your heel on the floor.   IF YOU ARE TRANSFERRED TO A SKILLED REHAB FACILITY If the patient is transferred to a skilled rehab facility following release from the hospital, a list of the current medications will be sent to the facility for the patient to continue.  When discharged from the skilled rehab facility, please have the facility set up the patient's Home Health Physical Therapy prior to being released. Also, the skilled facility will be responsible for providing the patient with their medications at time of release from the facility to include their pain medication, the muscle relaxants, and their blood thinner medication. If the patient is still at the rehab facility at time of the two week follow up appointment, the skilled rehab facility will also need to assist the patient in arranging follow up appointment in our office and any transportation needs.  MAKE SURE YOU:  Understand these instructions.  Get help right away if you are not doing well or get worse.    Pick up stool softner and laxative for home use following surgery while on pain medications. Do not submerge incision under water. Please use good hand washing techniques while changing dressing each day. May shower starting three days after surgery. Please use a clean towel to pat the incision dry following showers. Continue to use ice for pain and swelling after surgery. Do not use any lotions or creams on the incision until instructed by your surgeon.

## 2023-09-07 NOTE — Transfer of Care (Signed)
 Immediate Anesthesia Transfer of Care Note  Patient: Todd Farley  Procedure(s) Performed: ARTHROPLASTY, HIP, TOTAL, ANTERIOR APPROACH (Left: Hip)  Patient Location: PACU  Anesthesia Type:MAC and Spinal  Level of Consciousness: drowsy, patient cooperative, and responds to stimulation  Airway & Oxygen Therapy: Patient Spontanous Breathing and Patient connected to face mask oxygen  Post-op Assessment: Report given to RN, Post -op Vital signs reviewed and stable, Patient moving all extremities, and Patient able to stick tongue midline  Post vital signs: Reviewed and stable  Last Vitals:  Vitals Value Taken Time  BP 105/67 09/07/23 0941  Temp    Pulse 57 09/07/23 0945  Resp 13 09/07/23 0945  SpO2 100 % 09/07/23 0945  Vitals shown include unfiled device data.  Last Pain:  Vitals:   09/07/23 0622  TempSrc: Temporal         Complications: There were no known notable events for this encounter.

## 2023-09-07 NOTE — Op Note (Signed)
 Patient Name: Todd Farley  XBJ:[id number]  Pre-Operative Diagnosis: Left hip Osteoarthritis  Post-Operative Diagnosis: (same)  Procedure: Left Total Hip Arthroplasty  Components/Implants: Cup: Trident Tritanium Clusterhole 56/F w/x2 screws    Liner: Neutral X3 40/F  Stem: Insignia #3 Std Offset  Head:Biolox ceramic 40 with +0 sleeve  Date of Surgery: 09/07/2023  Surgeon: Venus Ginsberg MD  Assistant: Steen Eden RNFA (present and scrubbed throughout the case, critical for assistance with exposure, retraction, instrumentation, and closure), Sarah PAs   Anesthesiologist: Piscitello  Anesthesia: Spinal   EBL: 150cc  IVF:500cc  Complications: None   Brief history: The patient is a 78 year old male with a history of osteoarthritis of the left hip with pain limiting their range of motion and activities of daily living, which has failed multiple attempts at conservative therapy.  The risks and benefits of total hip arthroplasty as definitive surgical treatment were discussed with the patient, who opted to proceed with the operation.  After outpatient medical clearance and optimization was completed the patient was admitted to [facility] for the procedure.  All preoperative films were reviewed and an appropriate surgical plan was made prior to surgery.   Description of procedure: The patient was brought to the operating room where laterality was confirmed by all those present to be the left side.  The patient was administered spinal anesthesia on a stretcher prior to being moved supine on the operating room table. Patient was given an intravenous dose of antibiotics for surgical prophylaxis and TXA.  All bony prominences and extremities were well padded and the patient was securely attached to the table boots, a perineal post was placed and the patient had a safety strap placed.  Surgical site was prepped with alcohol and chlorhexidine . The surgical site over the hip  was and draped in typical sterile fashion with multiple layers of adhesive and nonadhesive drapes.  The incision site was marked out with a sterile marker and care was taken to assess the position of the ASIS and ensure appropriate position for the incision.    A surgical timeout was then called with participation of all staff in the room the patient was then a confirmed again and laterality confirmed.  Incision was made over the anterior lateral aspect of the proximal thigh in line with the TFL.  Appropriate retractors were placed and all bleeding vessels were coagulated within the subcutaneous and fatty layers.  An incision was made in the TFL fascia in the interval was carefully identified.  The lateral ascending branches of the circumflex vessels were identified, cauterized and carefully dissected. The main vessels were then tied with a 0 silk hand tie.  Retractors were placed around the superior lateral and inferior medial aspects of the femoral neck and a capsulotomy was performed exposing the hip joint.  Retraction stitches were placed and the capsulotomy to assist with visualization.  Femoral neck cut was then made and the femoral head was extracted after placing the leg in traction.  Bone wax was then applied to the proximal cut surface of the femur and aqua mantis was used to address any bleeding around the femoral neck cut.  Retractors were then placed around the acetabulum to fully visualize the joint space, and the remaining labral tissue was removed and pulvinar was removed.   The acetabulum was then sequentially reamed up to the appropriate size in order to get good fit and fill for the acetabular component while under fluoroscopic guidance.  Acetabular component was then placed  and malleted into a secure fit while confirming position and abduction angle and anteversion utilizing fluoroscopy.  2 screws were then placed in the acetabular cup to assist in securing the cup in place. The cup was  irrigated,  a real neutral liner was placed, impacted, and checked for stability. The femur traction was dropped and sequentially externally rotated while performing a release of the posterior and superomedial tissues off of the proximal femur to allow for mobility, care was taken to preserve the external rotators and piriformis attachments.  The remaining interval between the abductors and the capsule was dissected out and a retractor was placed over the superolateral aspect of the femur over the greater trochanter.  The leg was carefully brought down into extension and adducted to provide visualization of the proximal femur for broaching.  The femur was then sequentially broached up to an appropriate size which provided for good fill and stability to the femoral broach.  A trial neck and head were placed on the femoral broach and the leg was brought up for reduction.  The hip was reduced and manual check of stability was performed.  The hip was found to be stable in flexion internal rotation and extension external rotation.  Leg lengths were confirmed on fluoroscopy.   The hip was then dislocated the trial neck and head were removed.  The leg was then brought down into extension and adduction in the proximal femur was reexposed.  The broach trial was removed and the femur was irrigated with normal saline prior to the real femoral stem being implanted.  After the femoral stem was seated and shown to have good fit and fill the appropriate head was impacted the leg was brought up and reduced.  There was good range of motion with stability in flexion internal rotation and extension external rotation on testing.  Leg lengths were found to be appropriate on fluoroscopic evaluation at this time.  The hip was then irrigated with betdine based surgiphor solution and then saline solution.  The capsulotomy was repaired with Ethibond sutures.  A pericapsular and peritrochanteric cocktail with Exparel and bupivacaine was  then injected as well as the subcutaneous tissues. The fascia was closed with a #1 barbed running suture.  The deep tissues were closed with Vicryl sutures the subcutaneous tissues were closed with interrupted Vicryl sutures and a running barbed 4-0 suture.  The skin was then reinforced with Dermabond and a sterile dressing was placed.   The patient was awoken from anesthesia transferred off of the operating room table onto a hospital bed where examination of leg lengths found the leg lengths to be equal with a good distal pulse.  The patient was then transferred to the PACU in stable condition.

## 2023-09-07 NOTE — Anesthesia Procedure Notes (Addendum)
 Spinal  Patient location during procedure: OR Start time: 09/07/2023 7:34 AM End time: 09/07/2023 7:41 AM Reason for block: surgical anesthesia Staffing Performed: anesthesiologist  Anesthesiologist: Elvis Boot, Portia Brittle, MD Other anesthesia staff: Ian Maine, RN Performed by: Morna Arabian, MD Authorized by: Morna Arabian, MD   Preanesthetic Checklist Completed: patient identified, IV checked, site marked, risks and benefits discussed, surgical consent, monitors and equipment checked, pre-op evaluation and timeout performed Spinal Block Patient position: sitting Prep: DuraPrep Patient monitoring: heart rate, cardiac monitor, continuous pulse ox and blood pressure Approach: midline Location: L4-5 Injection technique: single-shot Needle Needle type: Sprotte  Needle gauge: 24 G Needle length: 9 cm Assessment Sensory level: T4 Events: CSF return Additional Notes Initial attempt by Claudine Cullens to L3-L4 interspace, successful placement by Dr. Jenise Mixer to L4-L5 interspace.

## 2023-09-07 NOTE — Anesthesia Preprocedure Evaluation (Signed)
 Anesthesia Evaluation  Patient identified by MRN, date of birth, ID band Patient awake    Reviewed: Allergy & Precautions, NPO status , Patient's Chart, lab work & pertinent test results  History of Anesthesia Complications Negative for: history of anesthetic complications  Airway Mallampati: III  TM Distance: >3 FB Neck ROM: full    Dental  (+) Chipped   Pulmonary shortness of breath and with exertion, former smoker   Pulmonary exam normal        Cardiovascular hypertension, (-) angina (-) Past MI and (-) DOE Normal cardiovascular exam     Neuro/Psych  Neuromuscular disease  negative psych ROS   GI/Hepatic Neg liver ROS,GERD  Controlled,,  Endo/Other  diabetes, Type 2    Renal/GU      Musculoskeletal   Abdominal   Peds  Hematology negative hematology ROS (+)   Anesthesia Other Findings Past Medical History: No date: Actinic keratosis No date: Anemia No date: Arthritis 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, right No date: Cancer (HCC)     Comment:  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 No date: Chronic bronchitis (HCC) No date: Diabetes mellitus without complication (HCC) No date: GERD (gastroesophageal reflux disease) No date: High cholesterol No date: History of kidney stones No date: Hypertension No date: Neuropathy No date: Pneumonia No date: Umbilical hernia  Past Surgical History: 01/16/2023: BRONCHIAL WASHINGS; N/A     Comment:  Procedure: BRONCHIAL WASHINGS;  Surgeon: Erskin Hearing, MD;  Location: [facility] ORS;  Service: Thoracic;                Laterality: N/A; No date: CARPAL TUNNEL RELEASE; Right 06/25/2015: COLONOSCOPY WITH PROPOFOL ; N/A     Comment:  Procedure: COLONOSCOPY WITH PROPOFOL ;  Surgeon: Cassie Click, MD;  Location: [facility] ENDOSCOPY;  Service:               Endoscopy;  Laterality: N/A; 06/25/2015: ESOPHAGOGASTRODUODENOSCOPY (EGD) WITH PROPOFOL ; N/A     Comment:  Procedure:  ESOPHAGOGASTRODUODENOSCOPY (EGD) WITH               PROPOFOL ;  Surgeon: Cassie Click, MD;  Location: [facility]              ENDOSCOPY;  Service: Endoscopy;  Laterality: N/A; No date: EYE SURGERY     Comment:  eyelids x2 No date: EYE SURGERY     Comment:  cataract x 2 - intraocular implants 01/16/2023: FLEXIBLE BRONCHOSCOPY; N/A     Comment:  Procedure: FLEXIBLE BRONCHOSCOPY;  Surgeon: Erskin Hearing, MD;  Location: [facility] ORS;  Service: Thoracic;                Laterality: N/A; No date: HERNIA REPAIR     Comment:  inguinal No date: JOINT REPLACEMENT; Right     Comment:  knee No date: KNEE ARTHROSCOPY; Right     Comment:  acl repair     Reproductive/Obstetrics negative OB ROS                             Anesthesia Physical Anesthesia Plan  ASA: 3  Anesthesia Plan: Spinal   Post-op Pain Management:    Induction:   PONV Risk Score and Plan:   Airway Management Planned: Natural Airway and  Nasal Cannula  Additional Equipment:   Intra-op Plan:   Post-operative Plan:   Informed Consent: I have reviewed the patients History and Physical, chart, labs and discussed the procedure including the risks, benefits and alternatives for the proposed anesthesia with the patient or authorized representative who has indicated his/her understanding and acceptance.     Dental Advisory Given  Plan Discussed with: Anesthesiologist, CRNA and Surgeon  Anesthesia Plan Comments: (Patient reports no bleeding problems and no anticoagulant use.  Plan for spinal with backup GA  Patient consented for risks of anesthesia including but not limited to:  - adverse reactions to medications - damage to eyes, teeth, lips or other oral mucosa - nerve damage due to positioning  - risk of bleeding, infection and or nerve damage from spinal that could lead to paralysis - risk of headache or failed spinal - damage to teeth, lips or other oral mucosa - sore throat  or hoarseness - damage to heart, brain, nerves, lungs, other parts of body or loss of life  Patient voiced understanding and assent.)       Anesthesia Quick Evaluation

## 2023-09-08 ENCOUNTER — Encounter: Payer: Self-pay | Admitting: Orthopedic Surgery

## 2023-09-08 DIAGNOSIS — M1612 Unilateral primary osteoarthritis, left hip: Secondary | ICD-10-CM | POA: Diagnosis not present

## 2023-09-08 LAB — BASIC METABOLIC PANEL WITH GFR
Anion gap: 7 (ref 5–15)
BUN: 22 mg/dL (ref 8–23)
CO2: 25 mmol/L (ref 22–32)
Calcium: 8.4 mg/dL — ABNORMAL LOW (ref 8.9–10.3)
Chloride: 105 mmol/L (ref 98–111)
Creatinine, Ser: 0.73 mg/dL (ref 0.61–1.24)
GFR, Estimated: >60 mL/min (ref >60)
Glucose, Bld: 144 mg/dL — ABNORMAL HIGH (ref 70–99)
Potassium: 4 mmol/L (ref 3.5–5.1)
Sodium: 137 mmol/L (ref 135–145)

## 2023-09-08 LAB — CBC
HCT: 32.3 % — ABNORMAL LOW (ref 39.0–52.0)
Hemoglobin: 10.4 g/dL — ABNORMAL LOW (ref 13.0–17.0)
MCH: 29.9 pg (ref 26.0–34.0)
MCHC: 32.2 g/dL (ref 30.0–36.0)
MCV: 92.8 fL (ref 80.0–100.0)
Platelets: 277 10*3/uL (ref 150–400)
RBC: 3.48 MIL/uL — ABNORMAL LOW (ref 4.22–5.81)
RDW: 13.7 % (ref 11.5–15.5)
WBC: 9.7 10*3/uL (ref 4.0–10.5)
nRBC: 0 % (ref 0.0–0.2)

## 2023-09-08 MED ORDER — ENOXAPARIN SODIUM 40 MG/0.4ML IJ SOSY: 40.0000 mg | PREFILLED_SYRINGE | INTRAMUSCULAR | 0 refills | Status: DC

## 2023-09-08 MED ORDER — TRAMADOL HCL 50 MG PO TABS
50.0000 mg | ORAL_TABLET | Freq: Four times a day (QID) | ORAL | 0 refills | Status: DC | PRN
Start: 2023-09-08 — End: 2023-12-04

## 2023-09-08 MED ORDER — HYDROCODONE-ACETAMINOPHEN 5-325 MG PO TABS: 1.0000 | ORAL_TABLET | ORAL | 0 refills | Status: DC | PRN

## 2023-09-08 NOTE — TOC Transition Note (Signed)
 Transition of Care [facility]) - Discharge Note   Patient Details  Name: Todd Farley MRN: [id number] Date of Birth: [birth year]  Transition of Care [facility]) CM/SW Contact:  Alexandra Ice, RN Phone Number: 09/08/2023, 8:40 AM   Clinical Narrative:    Patient to discharge today, home with home health. Patient set up with [facility] by surgeon's office prior to surgery. Per bedside nurse no DME needs at this time. Home health agency information added to AVS.    Final next level of care: Home w Home Health Services Barriers to Discharge: Barriers Resolved   Patient Goals and CMS Choice Patient states their goals for this hospitalization and ongoing recovery are:: get better          Discharge Placement                  Name of family member notified: Leah Patient and family notified of of transfer: 09/08/23  Discharge Plan and Services Additional resources added to the After Visit Summary for                    DME Agency: NA       HH Arranged: PT, OT HH Agency: [facility] Date HH Agency Contacted: 09/08/23 Time HH Agency Contacted: 0840 Representative spoke with at [facility] Agency: Shaun  Social Drivers of Health (SDOH) Interventions SDOH Screenings   Food Insecurity: No Food Insecurity (07/15/2023)   Received from [facility]  Housing: Unknown (07/15/2023)   Received from [facility]  Transportation Needs: Unknown (07/15/2023)   Received from [facility]  Utilities: Not At Risk (07/15/2023)   Received from [facility]  Financial Resource Strain: Low Risk  (07/15/2023)   Received from [facility]  Tobacco Use: Medium Risk (09/07/2023)     Readmission Risk Interventions     No data to display

## 2023-09-08 NOTE — Progress Notes (Signed)
 Physical Therapy Treatment Patient Details Name: Todd Farley MRN: [id number] DOB: [birth year] Today's Date: 09/08/2023   History of Present Illness Pt is a 78 yo male s/p L THA. PMH of SOB, former smoking, HTN, DM, GERD, skin cancer, R bronchial stenosis, R TKA.    PT Comments  Pt was finishing OT session at the start of PT session. He is A and O x 4. Agreeable to session and remains motivated and cooperative throughout. He was easily and safely able to stand, ambulate and perform stairs to simulate home entry. Pt is cleared from an acute PT standpoint for safe DC home with HHPT to follow.    If plan is discharge home, recommend the following: A little help with walking and/or transfers;A little help with bathing/dressing/bathroom;Assistance with cooking/housework;Assist for transportation;Help with stairs or ramp for entrance;Direct supervision/assist for medications management     Equipment Recommendations  None recommended by PT       Precautions / Restrictions Precautions Precautions: Fall;Anterior Hip Precaution Booklet Issued: Yes (comment) Recall of Precautions/Restrictions: Intact Restrictions Weight Bearing Restrictions Per Provider Order: Yes LLE Weight Bearing Per Provider Order: Weight bearing as tolerated     Mobility  Bed Mobility  General bed mobility comments: Pt was seated EOB upon arrival and then was on toilet post session    Transfers Overall transfer level: Needs assistance Equipment used: Rolling walker (2 wheels) Transfers: Sit to/from Stand Sit to Stand: Contact guard assist, Supervision  General transfer comment: CGA at first however progressed to SBA by the end of session with vcs and technique improvements    Ambulation/Gait Ambulation/Gait assistance: Supervision Gait Distance (Feet): 150 Feet Assistive device: Rolling walker (2 wheels) Gait Pattern/deviations: Step-through pattern Gait velocity: decreased  General Gait Details: no LOB.  seveer turn out on BLEs.   Stairs Stairs: Yes Stairs assistance: Supervision Stair Management: Two rails, Step to pattern, Forwards Number of Stairs: 4 General stair comments: Pt demonstrated safe performance of ascending/descending stairs to simulate home entry    Balance Overall balance assessment: Needs assistance Sitting-balance support: Feet supported Sitting balance-Leahy Scale: Good     Standing balance support: Bilateral upper extremity supported, During functional activity, Reliant on assistive device for balance Standing balance-Leahy Scale: Fair       Hotel manager: No apparent difficulties  Cognition Arousal: Alert Behavior During Therapy: WFL for tasks assessed/performed   PT - Cognitive impairments: No apparent impairments    PT - Cognition Comments: Pt is A and O x 4. Talkative! Following commands: Intact      Cueing Cueing Techniques: Verbal cues     General Comments General comments (skin integrity, edema, etc.): Reviewed expectations and post acute needs. Answered pt's/ spouse questions.      Pertinent Vitals/Pain Pain Assessment Pain Assessment: 0-10 Pain Score: 5  Pain Location: L hip with transition to EOB Pain Descriptors / Indicators: Aching, Sore Pain Intervention(s): Limited activity within patient's tolerance, Monitored during session, Premedicated before session, Repositioned    Home Living Family/patient expects to be discharged to:: Private residence Living Arrangements: Spouse/significant other Available Help at Discharge: Family Type of Home: House Home Access: Stairs to enter Entrance Stairs-Rails: Can reach both Entrance Stairs-Number of Steps: 3   Home Layout: One level Home Equipment: Agricultural consultant (2 wheels);Cane - single point;Tub bench;BSC/3in1      Prior Function            PT Goals (current goals can now be found in the care plan section) Acute Rehab PT  Goals Patient Stated Goal:  Recover and move more painfree Progress towards PT goals: Progressing toward goals    Frequency    BID       AM-PAC PT "6 Clicks" Mobility   Outcome Measure  Help needed turning from your back to your side while in a flat bed without using bedrails?: A Little Help needed moving from lying on your back to sitting on the side of a flat bed without using bedrails?: A Little Help needed moving to and from a bed to a chair (including a wheelchair)?: A Little Help needed standing up from a chair using your arms (e.g., wheelchair or bedside chair)?: A Little Help needed to walk in hospital room?: A Little Help needed climbing 3-5 steps with a railing? : A Lot 6 Click Score: 17    End of Session   Activity Tolerance: Patient tolerated treatment well Patient left: in chair;with call bell/phone within reach Nurse Communication: Mobility status PT Visit Diagnosis: Other abnormalities of gait and mobility (R26.89);Difficulty in walking, not elsewhere classified (R26.2);Muscle weakness (generalized) (M62.81);Pain Pain - Right/Left: Left Pain - part of body: Hip     Time: 1610-9604 PT Time Calculation (min) (ACUTE ONLY): 17 min  Charges:    $Gait Training: 8-22 mins PT General Charges $$ ACUTE PT VISIT: 1 Visit                     Chester Costa PTA 09/08/23, 10:44 AM

## 2023-09-08 NOTE — Anesthesia Postprocedure Evaluation (Signed)
 Anesthesia Post Note  Patient: Todd Farley  Procedure(s) Performed: ARTHROPLASTY, HIP, TOTAL, ANTERIOR APPROACH (Left: Hip)  Patient location during evaluation: Short Stay Anesthesia Type: Spinal Level of consciousness: awake and alert and oriented Pain management: satisfactory to patient Vital Signs Assessment: post-procedure vital signs reviewed and stable Respiratory status: spontaneous breathing Cardiovascular status: blood pressure returned to baseline Postop Assessment: no headache, patient able to bend at knees, no apparent nausea or vomiting, adequate PO intake and able to ambulate Anesthetic complications: no Comments: Has been able to void   There were no known notable events for this encounter.   Last Vitals:  Vitals:   09/07/23 1949 09/07/23 2338  BP: 135/68 (!) 122/58  Pulse: 71 71  Resp: 18 18  Temp: 36.8 C 37 C  SpO2: 98% 95%    Last Pain:  Vitals:   09/07/23 2338  TempSrc: Temporal  PainSc:                  Philippe Brazen

## 2023-09-08 NOTE — Evaluation (Signed)
 Occupational Therapy Evaluation Patient Details Name: Todd Farley MRN: [id number] DOB: 1946-02-01 Today's Date: 09/08/2023   History of Present Illness   Pt is a 78 yo male s/p L THA. PMH of SOB, former smoking, HTN, DM, GERD, skin cancer, R bronchial stenosis, R TKA.    Clinical Impressions Pt seen for OT evaluation this date, POD#1 from above surgery. Pt was independent in all ADL prior to surgery and eager to return to PLOF with less pain and improved safety and independence. Pt currently requires minimal assist for LB dressing and bathing while in seated position due to pain and limited AROM of L hip. Pt/spouse instructed in self care skills, falls prevention strategies, home/routines modifications, DME/AE for LB bathing and dressing tasks, and compression stocking mgt strategies. Pt/spouse verbalized understanding. Provided with handout to support recall and carryover. Also educated on polar care, as pt and spouse purchased the hip wrap for their existing polar care from previous knee surgery to use. Pt completed bed mobility with supv, CGA for STS and ambulation in hallway with RW and initial VC for RW use. No additional acute OT needs. Will sign off.     If plan is discharge home, recommend the following:   A little help with walking and/or transfers;A little help with bathing/dressing/bathroom;Assistance with cooking/housework;Assist for transportation;Help with stairs or ramp for entrance     Functional Status Assessment   Patient has had a recent decline in their functional status and demonstrates the ability to make significant improvements in function in a reasonable and predictable amount of time.     Equipment Recommendations   None recommended by OT      Precautions/Restrictions   Precautions Precautions: Fall;Anterior Hip Precaution Booklet Issued: Yes (comment) Recall of Precautions/Restrictions: Intact Restrictions Weight Bearing Restrictions Per Provider  Order: Yes LLE Weight Bearing Per Provider Order: Weight bearing as tolerated     Mobility Bed Mobility Overal bed mobility: Needs Assistance Bed Mobility: Supine to Sit     Supine to sit: Supervision, Used rails, HOB elevated   Transfers Overall transfer level: Needs assistance Equipment used: Rolling walker (2 wheels) Transfers: Sit to/from Stand, Bed to chair/wheelchair/BSC Sit to Stand: Contact guard assist           General transfer comment: effortful from std height bed      Balance Overall balance assessment: Needs assistance Sitting-balance support: Feet supported Sitting balance-Leahy Scale: Good     Standing balance support: Bilateral upper extremity supported Standing balance-Leahy Scale: Fair       ADL either performed or assessed with clinical judgement   ADL Overall ADL's : Needs assistance/impaired      General ADL Comments: Pt requires MIN A for LB ADL tasks - spouse able to provide needed level of assist      Pertinent Vitals/Pain Pain Assessment Pain Assessment: 0-10 Pain Score: 8  Pain Location: L hip with transition to EOB Pain Descriptors / Indicators: Aching, Sore Pain Intervention(s): Limited activity within patient's tolerance, Monitored during session, Premedicated before session, Repositioned     Extremity/Trunk Assessment Upper Extremity Assessment Upper Extremity Assessment: Overall WFL for tasks assessed   Lower Extremity Assessment Lower Extremity Assessment: Defer to PT evaluation;LLE deficits/detail (B feet externally rotated) LLE Deficits / Details: pain limited but able to lift LE against gravity       Communication Communication Communication: No apparent difficulties   Cognition Arousal: Alert Behavior During Therapy: WFL for tasks assessed/performed Cognition: No apparent impairments  Following commands: Intact       Cueing  General Comments   Cueing Techniques: Verbal cues      Exercises Other  Exercises Other Exercises: Pt/spouse educated in home/routines modifications, AE/DME, falls prevention   Shoulder Instructions      Home Living Family/patient expects to be discharged to:: Private residence Living Arrangements: Spouse/significant other Available Help at Discharge: Family Type of Home: House Home Access: Stairs to enter Secretary/administrator of Steps: 3 Entrance Stairs-Rails: Can reach both Home Layout: One level         Bathroom Toilet: Handicapped height     Home Equipment: Agricultural consultant (2 wheels);Cane - single point;Tub bench;BSC/3in1          Prior Functioning/Environment Prior Level of Function : Independent/Modified Independent       OT Problem List: Decreased strength;Pain;Impaired balance (sitting and/or standing);Decreased knowledge of use of DME or AE        OT Goals(Current goals can be found in the care plan section)   Acute Rehab OT Goals Patient Stated Goal: go home OT Goal Formulation: All assessment and education complete, DC therapy   AM-PAC OT "6 Clicks" Daily Activity     Outcome Measure Help from another person eating meals?: None Help from another person taking care of personal grooming?: None Help from another person toileting, which includes using toliet, bedpan, or urinal?: None Help from another person bathing (including washing, rinsing, drying)?: A Little Help from another person to put on and taking off regular upper body clothing?: None Help from another person to put on and taking off regular lower body clothing?: A Little 6 Click Score: 22   End of Session Equipment Utilized During Treatment: Gait belt;Rolling walker (2 wheels)  Activity Tolerance: Patient tolerated treatment well Patient left: Other (comment) (seated EOB with PT)  OT Visit Diagnosis: Other abnormalities of gait and mobility (R26.89);Pain Pain - Right/Left: Left Pain - part of body: Hip                Time: 0826-0900 OT Time Calculation  (min): 34 min Charges:  OT General Charges $OT Visit: 1 Visit OT Evaluation $OT Eval Low Complexity: 1 Low OT Treatments $Self Care/Home Management : 23-37 mins  Berenda Breaker., MPH, MS, OTR/L ascom [phone] 09/08/23, 10:28 AM

## 2023-09-08 NOTE — Discharge Summary (Signed)
 Physician Discharge Summary  Subjective: 1 Day Post-Op Procedure(s) (LRB): ARTHROPLASTY, HIP, TOTAL, ANTERIOR APPROACH (Left) Patient reports pain as mild.   Patient seen in rounds with Dr. Clyda Dark. Patient is well, and has had no acute complaints or problems Patient is ready to go home with home health physical therapy  Physician Discharge Summary  Patient ID: Todd Farley MRN: [id number] DOB/AGE: 78/15/1947 78 y.o.  Admit date: 09/07/2023 Discharge date: 09/08/2023  Admission Diagnoses:  Discharge Diagnoses:  Principal Problem:   S/P total left hip arthroplasty   Discharged Condition: fair  Hospital Course: The patient is postop day 1 from a left anterior approach total hip replacement.  He is doing well since surgery.  He has ambulated to the bathroom.  He has done physical therapy once and is ready to do therapy this morning.  He is having some muscle spasms.  His vitals are stable.  His pain is manageable.  He is ready to go home with home health physical therapy this afternoon.  Treatments: surgery:   Left Total Hip Arthroplasty   Components/Implants: Cup: Trident Tritanium Clusterhole 56/F w/x2 screws    Liner: Neutral X3 40/F  Stem: Insignia #3 Std Offset  Head:Biolox ceramic 40 with +0 sleeve   Date of Surgery: 09/07/2023   Surgeon: Venus Ginsberg MD   Assistant: Steen Eden RNFA (present and scrubbed throughout the case, critical for assistance with exposure, retraction, instrumentation, and closure), Sarah PAs     Anesthesiologist: Piscitello   Anesthesia: Spinal    EBL: 150cc   IVF:500cc   Complications: None    Discharge Exam: Blood pressure (!) 122/58, pulse 71, temperature 98.6 F (37 C), temperature source Temporal, resp. rate 18, SpO2 95%.   Disposition: Discharge disposition: 01-Home or Self Care        Allergies as of 09/08/2023       Reactions   Other Shortness Of Breath   Siemese cats   Symbicort [budesonide-formoterol Fumarate]  Shortness Of Breath        Medication List     TAKE these medications    acetaminophen 500 MG tablet Commonly known as: TYLENOL Take 500-1,000 mg by mouth See admin instructions. Take 1000 mg in the morning and 500 mg in the evening   ADULT ONE DAILY GUMMIES PO Take 2 capsules by mouth daily.   amoxicillin 500 MG capsule Commonly known as: AMOXIL Take 2,000 mg by mouth See admin instructions. 1 hour prior to dental work   aspirin EC 81 MG tablet Take 81 mg by mouth daily. Swallow whole.   enoxaparin 40 MG/0.4ML injection Commonly known as: LOVENOX Inject 0.4 mLs (40 mg total) into the skin daily for 14 days.   fluocinonide gel 0.05 % Commonly known as: LIDEX Apply 1 application  topically daily as needed (dry lips/mouth).   folic acid 400 MCG tablet Commonly known as: FOLVITE Take 800 mcg by mouth daily.   glipiZIDE 5 MG tablet Commonly known as: GLUCOTROL Take 5 mg by mouth daily before breakfast.   HYDROcodone-acetaminophen 5-325 MG tablet Commonly known as: NORCO/VICODIN Take 1-2 tablets by mouth every 4 (four) hours as needed for severe pain (pain score 7-10).   ibuprofen 200 MG tablet Commonly known as: ADVIL Take 200-400 mg by mouth See admin instructions. Take 400 mg in the morning and 200 mg in the evening   IRON PO Take 1 tablet by mouth daily.   loratadine 10 MG tablet Commonly known as: CLARITIN Take 10 mg by mouth daily.  losartan 50 MG tablet Commonly known as: COZAAR Take 50 mg by mouth daily.   metFORMIN 1000 MG tablet Commonly known as: GLUCOPHAGE Take 2,000 mg by mouth daily.   omeprazole 40 MG capsule Commonly known as: PRILOSEC Take 40 mg by mouth daily.   PRESCRIPTION MEDICATION Apply 1 Application topically daily. Azelaic acid, Ivermectin, metronidazole compounded cream   simvastatin 20 MG tablet Commonly known as: ZOCOR Take 20 mg by mouth daily.   traMADol 50 MG tablet Commonly known as: ULTRAM Take 1 tablet (50 mg  total) by mouth every 6 (six) hours as needed for moderate pain (pain score 4-6).   triamcinolone ointment 0.1 % Commonly known as: KENALOG Apply 1 Application topically 3 (three) times daily as needed.   Vitamin B-12 6000 MCG Subl Place 6,000 mcg under the tongue daily.        Follow-up Information     [facility]  Follow up.   Contact information: [address] [place] [address] [phone]         Coralyn Derry, PA-C Follow up in 2 week(s).   Specialties: Orthopedic Surgery, Emergency Medicine Why: For wound care and postoperative care Contact information: [address] [facility] [address] [facility] [place] [address] [phone]                 Signed: Sharyon Deis, Younique Casad 09/08/2023, 6:43 AM   Objective: Vital signs in last 24 hours: Temp:  [97.6 F (36.4 C)-98.6 F (37 C)] 98.6 F (37 C) (06/09 2338) Pulse Rate:  [57-90] 71 (06/09 2338) Resp:  [13-27] 18 (06/09 2338) BP: (103-135)/(58-79) 122/58 (06/09 2338) SpO2:  [95 %-100 %] 95 % (06/09 2338)  Intake/Output from previous day:  Intake/Output Summary (Last 24 hours) at 09/08/2023 0643 Last data filed at 09/08/2023 0606 Gross per 24 hour  Intake 1630.29 ml  Output 150 ml  Net 1480.29 ml    Intake/Output this shift: Total I/O In: 264.6 [I.V.:264.6] Out: -   Labs: No results for input(s): HGB in the last 72 hours. No results for input(s): WBC, RBC, HCT, PLT in the last 72 hours. No results for input(s): NA, K, CL, CO2, BUN, CREATININE, GLUCOSE, CALCIUM in the last 72 hours. No results for input(s): LABPT, INR in the last 72 hours.  EXAM: General - Patient is Alert and Oriented Extremity - Neurovascular intact Sensation intact distally Dorsiflexion/Plantar flexion intact Compartment soft Incision - clean, dry, no drainage Motor Function -plantarflexion and dorsiflexion intact.  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   Weight as of 08/25/23: 93 kg. Advance diet Up with therapy D/C IV fluids Discharge home with home health Diet - Regular diet Follow up -  in 2 weeks Activity - WBAT Disposition - Home Condition Upon Discharge - Stable DVT Prophylaxis - Lovenox and support stockings  Bert Britain, PA-C Orthopaedic Surgery 09/08/2023, 6:43 AM

## 2023-09-08 NOTE — Plan of Care (Signed)
 Patient ready for discharge.  Discharge instructions provided to and reviewed with patient and patient's spouse   Printed prescriptions given to patient's spouse, TED hose on patient.

## 2023-09-08 NOTE — Plan of Care (Signed)
 Progressing towards discharge

## 2023-09-08 NOTE — Progress Notes (Signed)
  Subjective: 1 Day Post-Op Procedure(s) (LRB): ARTHROPLASTY, HIP, TOTAL, ANTERIOR APPROACH (Left) Patient reports pain as mild.   Patient is well, and has had no acute complaints or problems Plan is to go Home after hospital stay. Negative for chest pain and shortness of breath Fever: no Gastrointestinal: Negative for nausea and vomiting  Objective: Vital signs in last 24 hours: Temp:  [97.6 F (36.4 C)-98.6 F (37 C)] 98.6 F (37 C) (06/09 2338) Pulse Rate:  [57-90] 71 (06/09 2338) Resp:  [13-27] 18 (06/09 2338) BP: (103-135)/(58-79) 122/58 (06/09 2338) SpO2:  [95 %-100 %] 95 % (06/09 2338)  Intake/Output from previous day:  Intake/Output Summary (Last 24 hours) at 09/08/2023 0641 Last data filed at 09/08/2023 0606 Gross per 24 hour  Intake 1630.29 ml  Output 150 ml  Net 1480.29 ml    Intake/Output this shift: Total I/O In: 264.6 [I.V.:264.6] Out: -   Labs: No results for input(s): "HGB" in the last 72 hours. No results for input(s): "WBC", "RBC", "HCT", "PLT" in the last 72 hours. No results for input(s): "NA", "K", "CL", "CO2", "BUN", "CREATININE", "GLUCOSE", "CALCIUM" in the last 72 hours. No results for input(s): "LABPT", "INR" in the last 72 hours.   EXAM General - Patient is Alert and Oriented Extremity - Neurovascular intact Sensation intact distally Dorsiflexion/Plantar flexion intact Compartment soft Dressing/Incision - clean, dry, no drainage Motor Function - intact, moving foot and toes well on exam.   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   Weight as of 08/25/23: 93 kg. Advance diet Up with therapy D/C IV fluids Discharge home with home health  DVT Prophylaxis - Lovenox and TED hose Weight-Bearing as tolerated to left leg  Bert Britain, PA-C Orthopaedic Surgery 09/08/2023, 6:41 AM

## 2023-09-24 ENCOUNTER — Ambulatory Visit: Attending: Orthopedic Surgery

## 2023-09-24 DIAGNOSIS — R269 Unspecified abnormalities of gait and mobility: Secondary | ICD-10-CM | POA: Insufficient documentation

## 2023-09-24 DIAGNOSIS — Z96642 Presence of left artificial hip joint: Secondary | ICD-10-CM | POA: Diagnosis present

## 2023-09-24 DIAGNOSIS — R262 Difficulty in walking, not elsewhere classified: Secondary | ICD-10-CM | POA: Insufficient documentation

## 2023-09-24 DIAGNOSIS — M6281 Muscle weakness (generalized): Secondary | ICD-10-CM | POA: Diagnosis present

## 2023-09-24 DIAGNOSIS — R2681 Unsteadiness on feet: Secondary | ICD-10-CM | POA: Diagnosis present

## 2023-09-24 NOTE — Therapy (Signed)
 OUTPATIENT PHYSICAL THERAPY LOWER EXTREMITY EVALUATION   Patient Name: Todd Farley MRN: [id number] DOB:05-01-1945, 78 y.o., male Today's Date: 09/24/2023  END OF SESSION:  PT End of Session - 09/24/23 2105     Visit Number 1    Number of Visits 24    Date for PT Re-Evaluation 11/05/23    Progress Note Due on Visit 10    PT Start Time 1015    PT Stop Time 1104    PT Time Calculation (min) 49 min    Equipment Utilized During Treatment Gait belt    Activity Tolerance Patient tolerated treatment well    Behavior During Therapy WFL for tasks assessed/performed          Past Medical History:  Diagnosis Date   Actinic keratosis    Anemia    Arthritis    Bronchial stenosis, right    Cancer (HCC)    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   Chronic bronchitis (HCC)    Diabetes mellitus without complication (HCC)    GERD (gastroesophageal reflux disease)    High cholesterol    History of kidney stones    Hypertension    Neuropathy    Pneumonia    Umbilical hernia    Past Surgical History:  Procedure Laterality Date   BRONCHIAL WASHINGS N/A 01/16/2023   Procedure: BRONCHIAL WASHINGS;  Surgeon: Parris Manna, MD;  Location: [facility] ORS;  Service: Thoracic;  Laterality: N/A;   CARPAL TUNNEL RELEASE Right    COLONOSCOPY WITH PROPOFOL  N/A 06/25/2015   Procedure: COLONOSCOPY WITH PROPOFOL ;  Surgeon: Lamar ONEIDA Holmes, MD;  Location: [facility] ENDOSCOPY;  Service: Endoscopy;  Laterality: N/A;   ESOPHAGOGASTRODUODENOSCOPY (EGD) WITH PROPOFOL  N/A 06/25/2015   Procedure: ESOPHAGOGASTRODUODENOSCOPY (EGD) WITH PROPOFOL ;  Surgeon: Lamar ONEIDA Holmes, MD;  Location: [facility] ENDOSCOPY;  Service: Endoscopy;  Laterality: N/A;   EYE SURGERY     eyelids x2   EYE SURGERY     cataract x 2 - intraocular implants   FLEXIBLE BRONCHOSCOPY N/A 01/16/2023   Procedure: FLEXIBLE BRONCHOSCOPY;  Surgeon: Parris Manna, MD;  Location: [facility] ORS;  Service: Thoracic;  Laterality: N/A;   HERNIA REPAIR     inguinal   JOINT  REPLACEMENT Right    knee   KNEE ARTHROSCOPY Right    acl repair   TOTAL HIP ARTHROPLASTY Left 09/07/2023   Procedure: ARTHROPLASTY, HIP, TOTAL, ANTERIOR APPROACH;  Surgeon: Lorelle Hussar, MD;  Location: [facility] ORS;  Service: Orthopedics;  Laterality: Left;  spinal with sedation   Patient Active Problem List   Diagnosis Date Noted   S/P total left hip arthroplasty 09/07/2023   Umbilical hernia without obstruction and without gangrene 01/26/2018   Diabetic polyneuropathy associated with type 2 diabetes mellitus (HCC) 10/13/2017   GERD (gastroesophageal reflux disease) 04/13/2015   Diabetes mellitus type 2, uncomplicated (HCC) 03/14/2014   Hypertension 03/14/2014    PCP: Dr. Norleen Rower  REFERRING PROVIDER: Lonni Amber, PA-C  REFERRING DIAG: S/P Left Total Hip Arthorplasty  THERAPY DIAG:  Muscle weakness (generalized)  Difficulty in walking, not elsewhere classified  Abnormality of gait and mobility  Unsteadiness on feet  Status post total hip replacement, left  Rationale for Evaluation and Treatment: Rehabilitation  ONSET DATE: over 1 year  SUBJECTIVE:   SUBJECTIVE STATEMENT: - Patient reports doing well 3 weeks post op left total hip replacement surgery. He reports his pain is well managed and only around 1-2/10. He states he is currently walking with a cane and already feeling better with his balance than  he did when he was coming to outpatient PT earlier this year prior to surgery.   PERTINENT HISTORY: S/p Left THA (Anterior approach) by Dr. Lorelle on 09/07/2023- From OA with pain worsening. He also has som bronchial trunk scarring which may require surgery once he recovers from his hip surgery.  PAIN:  Are you having pain? Yes: NPRS scale: 2/10 Left hip pain Pain location: Left lateral hip pain Pain description: ache Aggravating factors: active standing, transfers Relieving factors: Rest  PRECAUTIONS: Anterior hip- none noted in referral or MD  notes  RED FLAGS: None   WEIGHT BEARING RESTRICTIONS: No  FALLS:  Has patient fallen in last 6 months? No  LIVING ENVIRONMENT: Lives with: lives with their spouse Lives in: House/apartment Stairs: Yes: Internal: 15 steps; on left going up and External: 3 steps; on right going up Has following equipment at home: Single point cane, Walker - 2 wheeled, shower chair, and bed side commode  OCCUPATION: Retired  PLOF: Independent  PATIENT GOALS: Recover- improve my strength and balance  NEXT MD VISIT: Dr. Lorelle- End of July  OBJECTIVE:  Note: Objective measures were completed at Evaluation unless otherwise noted.  DIAGNOSTIC FINDINGS: DG HIP (WITH OR WITHOUT PELVIS) 2-3V LEFT   COMPARISON:  None Available.   FINDINGS: Images were performed intraoperatively without the presence of a radiologist. The patient is undergoing total left hip arthroplasty. No hardware complication is seen.   Total fluoroscopy images: 3   Total fluoroscopy time: 26 seconds   Total dose: Radiation Exposure Index (as provided by the fluoroscopic device): 3.83 mGy air Kerma   Please see intraoperative findings for further detail.   IMPRESSION: Intraoperative fluoroscopy for total left hip arthroplasty.     Electronically Signed   By: Tanda Lyons M.D.   On: 09/07/2023 09:45  PATIENT SURVEYS:  LEFS  Extreme difficulty/unable (0), Quite a bit of difficulty (1), Moderate difficulty (2), Little difficulty (3), No difficulty (4) Survey date:    Any of your usual work, housework or school activities 3  2. Usual hobbies, recreational or sporting activities 1  3. Getting into/out of the bath 3  4. Walking between rooms 4  5. Putting on socks/shoes 3  6. Squatting  2  7. Lifting an object, like a bag of groceries from the floor 0  8. Performing light activities around your home 3  9. Performing heavy activities around your home 1  10. Getting into/out of a car 4  11. Walking 2 blocks 1  12.  Walking 1 mile 1  13. Going up/down 10 stairs (1 flight) 3  14. Standing for 1 hour 1  15.  sitting for 1 hour 3  16. Running on even ground 1  17. Running on uneven ground 0  18. Making sharp turns while running fast 0  19. Hopping  0  20. Rolling over in bed 3  Score total:  37     COGNITION: Overall cognitive status: Within functional limits for tasks assessed     SENSATION: Light touch: Impaired  - B LE known neuropathy  EDEMA:  None observed   POSTURE: rounded shoulders, forward head, and Left LE externally rotated hip/foot with flat arch.   PALPATION: NT  LOWER EXTREMITY ROM:  Active ROM Right eval Left eval  Hip flexion    Hip extension    Hip abduction    Hip adduction    Hip internal rotation    Hip external rotation    Knee flexion  Knee extension    Ankle dorsiflexion    Ankle plantarflexion    Ankle inversion    Ankle eversion     (Blank rows = not tested)  LOWER EXTREMITY MMT:  MMT Right eval Left eval  Hip flexion 5 4  Hip extension 5 4  Hip abduction 5 4  Hip adduction 5 4  Hip internal rotation 5 NT  Hip external rotation 5 NT  Knee flexion 5 4  Knee extension 5 4  Ankle dorsiflexion 5 4  Ankle plantarflexion    Ankle inversion    Ankle eversion     (Blank rows = not tested) Manual Muscle Test Scale 0/5 = No muscle contraction can be seen or felt 1/5 = Contraction can be felt, but there is no motion 2-/5 = Part moves through incomplete ROM w/ gravity decreased 2/5 = Part moves through complete ROM w/ gravity decreased 2+/5 = Part moves through incomplete ROM (<50%) against gravity or through complete ROM w/ gravity 3-/5 = Part moves through incomplete ROM (>50%) against gravity 3/5 = Part moves through complete ROM against gravity 3+/5 = Part moves through complete ROM against gravity/slight resistance 4-/5= Holds test position against slight to moderate pressure 4/5 = Part moves through complete ROM against gravity/moderate  resistance 4+/5= Holds test position against moderate to strong pressure 5/5 = Part moves through complete ROM against gravity/full resistance   LOWER EXTREMITY SPECIAL TESTS:  Not tested- known s/p L THA  FUNCTIONAL TESTS:  5 times sit to stand: 21.82 sec without UE support Timed up and go (TUG): 16.82 sec with use of SPC 6 minute walk test: To be assessed next visit 10 meter walk test: To be assessed next visit Berg Balance Scale:  Item Test date: 09/24/2023 Test date:  Test date:   Sitting to standing 4. able to stand without using hands and stabilize independently Insert OPRCBERGREEVAL SmartPhrase at re-test date Insert OPRCBERGREEVAL SmartPhrase at re-test date  2. Standing unsupported 4. able to stand safely for 2 minutes    3. Sitting with back unsupported, feet supported 4. able to sit safely and securely for 2 minutes    4. Standing to sitting 4. sits safely with minimal use of hands    5. Pivot transfer  4. able to transfer safely with minor use of hands    6. Standing unsupported with eyes closed 3. able to stand 10 seconds with supervision    7. Standing unsupported with feet together 4. able to place feet together independently and stand 1 minute safely    8. Reaching forward with outstretched arms while standing 4. can reach forward confidently 25 cm (10 inches)    9. Pick up object from the floor from standing 4. able to pick up slipper safely and easily    10. Turning to look behind over left and right shoulders while standing 2. turns sideways but only maintains balance    11. Turn 360 degrees 2. able to turn 360 degrees safely but slowly    12. Place alternate foot on step or stool while standing unsupported 4. ble to stand independently and safely and complete 8 steps in 20 seconds    13. Standing unsupported one foot in front 2. able to take small step independently and hold 30 seconds    14. Standing on one leg 1. tries to lift leg unable to hold 3 seconds but remains  standing independently.      Total Score 46/56 Total Score /56 Total  Score /56     GAIT: Distance walked: >100 feet Assistive device utilized: Single point cane Level of assistance: CGA Comments: decreased step length, step height, unsteady, Left foot abducted due to no arch support.                                                                                                                                 TREATMENT DATE: 09/24/2023  PT Evaluation today  Self care: Review of Hip surgery- anatomy, anterior approach with precautions- none specified at this time. Review of current HEP.    PATIENT EDUCATION:  Education details: Purpose of PT: Review of hip replacement and any precautions; Review of HEP from HHPT.  Person educated: Patient Education method: Explanation, Demonstration, Tactile cues, and Verbal cues Education comprehension: verbalized understanding, returned demonstration, verbal cues required, tactile cues required, and needs further education  HOME EXERCISE PROGRAM: To be initiated next 1-2 visits- For now instructed to continue with discussed HH supine and standing LE strengthening.   ASSESSMENT:  CLINICAL IMPRESSION: Patient is a 78 y.o. male  who was seen today for physical therapy evaluation and treatment for s/p Left Total hip arthroplasty from OA. Patient presents with minimal report of left hip pain along with some left LE muscle weakness He alos presents with impaired functional mobility- using a cane for balance. He presents with decreased TUG score and some balance impairment as seen by 46/56 on BERG balance test.  Pt will benefit from PT services to address deficits in strength, mobility, and pain in order to return to full function at home with less knee pain  OBJECTIVE IMPAIRMENTS: Abnormal gait, decreased activity tolerance, decreased balance, decreased coordination, decreased endurance, decreased mobility, difficulty walking, decreased strength, and pain.    ACTIVITY LIMITATIONS: carrying, lifting, bending, sitting, standing, squatting, and stairs  PARTICIPATION LIMITATIONS: meal prep, cleaning, laundry, shopping, community activity, and yard work  PERSONAL FACTORS: 1-2 comorbidities: arthritis and bronchial issues are also affecting patient's functional outcome.   REHAB POTENTIAL: Good  CLINICAL DECISION MAKING: Stable/uncomplicated  EVALUATION COMPLEXITY: Low   GOALS: Goals reviewed with patient? Yes  SHORT TERM GOALS: Target date: 11/05/2023 Pt will be independent with HEP in order to decrease ankle pain and increase strength in order to improve pain-free function at home.  Baseline: EVAL- Will need updated HEP as he progresses and currently performing supine and standing from HHPT.  Goal status: INITIAL   LONG TERM GOALS: Target date: 12/17/2023  Pt will increase LEFS by at least 9 points in order to demonstrate significant improvement in lower extremity function.   Baseline: EVAL= 37/80 Goal status: INITIAL  2.  Pt will decrease worst pain in left hip as reported on NPRS by to 1/10 or no pain in order to demonstrate clinically significant reduction in ankle/foot pain. Baseline: EVAL= 1-2/10 Goal status: INITIAL  3.  Patient will improve his SLS balance by 5 sec on Left LE for improved hip  stability with standing/walking.  Baseline: EVAL - 1-2 sec hold only on LLE Goal status: INITIAL  4.  Pt will improve BERG by at least 3 points in order to demonstrate clinically significant improvement in balance. Baseline: EVAL= 46/56 Goal status: INITIAL  5.  Pt will decrease 5TSTS by at least 7 seconds in order to demonstrate clinically significant improvement in LE strength. Baseline: EVAL= 21.82 sec without UE support Goal status: INITIAL  6.  Pt will decrease TUG to below 14 seconds/decrease in order to demonstrate decreased fall risk. Baseline: EVAL= Pt will decrease TUG to below 14 seconds/decrease in order to demonstrate  decreased fall risk. Goal status: INITIAL   PLAN:  PT FREQUENCY: 1-2x/week  PT DURATION: 12 weeks  PLANNED INTERVENTIONS: [id number]- PT Re-evaluation, 97110-Therapeutic exercises, 97530- Therapeutic activity, [id number]- Neuromuscular re-education, 97535- Self Care, [id number]- Manual therapy, [id number]- Gait training, [phone]- Orthotic Initial, [phone]- Orthotic/Prosthetic subsequent, [phone]- Canalith repositioning, [phone]- Electrical stimulation (manual), [id number]- Ultrasound, [phone] (1-2 muscles), [id number] (3+ muscles)- Dry Needling, Patient/Family education, Balance training, Stair training, Taping, Joint mobilization, Joint manipulation, Spinal manipulation, Spinal mobilization, Scar mobilization, Vestibular training, DME instructions, Cryotherapy, and Moist heat  PLAN FOR NEXT SESSION:  6 min walk test 10 M walk tes *add goals as appropriate for both Initiate progressive LE strengthening and balance activities  Initiate HEP next 1-2 visits   Reyes LOISE London, PT 09/24/2023, 9:51 PM

## 2023-09-28 ENCOUNTER — Ambulatory Visit

## 2023-09-28 ENCOUNTER — Encounter: Payer: Self-pay | Admitting: Dermatology

## 2023-09-28 ENCOUNTER — Ambulatory Visit (INDEPENDENT_AMBULATORY_CARE_PROVIDER_SITE_OTHER): Admitting: Dermatology

## 2023-09-28 DIAGNOSIS — R262 Difficulty in walking, not elsewhere classified: Secondary | ICD-10-CM

## 2023-09-28 DIAGNOSIS — Z7189 Other specified counseling: Secondary | ICD-10-CM

## 2023-09-28 DIAGNOSIS — M6281 Muscle weakness (generalized): Secondary | ICD-10-CM | POA: Diagnosis not present

## 2023-09-28 DIAGNOSIS — L821 Other seborrheic keratosis: Secondary | ICD-10-CM

## 2023-09-28 DIAGNOSIS — Z1283 Encounter for screening for malignant neoplasm of skin: Secondary | ICD-10-CM | POA: Diagnosis not present

## 2023-09-28 DIAGNOSIS — W908XXA Exposure to other nonionizing radiation, initial encounter: Secondary | ICD-10-CM | POA: Diagnosis not present

## 2023-09-28 DIAGNOSIS — Z79899 Other long term (current) drug therapy: Secondary | ICD-10-CM

## 2023-09-28 DIAGNOSIS — D229 Melanocytic nevi, unspecified: Secondary | ICD-10-CM

## 2023-09-28 DIAGNOSIS — D1801 Hemangioma of skin and subcutaneous tissue: Secondary | ICD-10-CM

## 2023-09-28 DIAGNOSIS — L578 Other skin changes due to chronic exposure to nonionizing radiation: Secondary | ICD-10-CM

## 2023-09-28 DIAGNOSIS — Z5111 Encounter for antineoplastic chemotherapy: Secondary | ICD-10-CM

## 2023-09-28 DIAGNOSIS — L814 Other melanin hyperpigmentation: Secondary | ICD-10-CM

## 2023-09-28 DIAGNOSIS — L570 Actinic keratosis: Secondary | ICD-10-CM | POA: Diagnosis not present

## 2023-09-28 DIAGNOSIS — Z96642 Presence of left artificial hip joint: Secondary | ICD-10-CM

## 2023-09-28 DIAGNOSIS — Z872 Personal history of diseases of the skin and subcutaneous tissue: Secondary | ICD-10-CM

## 2023-09-28 DIAGNOSIS — R269 Unspecified abnormalities of gait and mobility: Secondary | ICD-10-CM

## 2023-09-28 DIAGNOSIS — R2681 Unsteadiness on feet: Secondary | ICD-10-CM

## 2023-09-28 MED ORDER — FLUOROURACIL 5 % EX CREA: TOPICAL_CREAM | CUTANEOUS | 2 refills | Status: AC

## 2023-09-28 NOTE — Patient Instructions (Addendum)
 In September  Start Fluorouracil  5%/Calcipotriene cream - apply twice a day for 10 days on his scalp   5-Fluorouracil /Calcipotriene Patient Education   Actinic keratoses are the dry, red scaly spots on the skin caused by sun damage. A portion of these spots can turn into skin cancer with time, and treating them can help prevent development of skin cancer.   Treatment of these spots requires removal of the defective skin cells. There are various ways to remove actinic keratoses, including freezing with liquid nitrogen, treatment with creams, or treatment with a blue light procedure in the office.   5-fluorouracil  cream is a topical cream used to treat actinic keratoses. It works by interfering with the growth of abnormal fast-growing skin cells, such as actinic keratoses. These cells peel off and are replaced by healthy ones.   5-fluorouracil /calcipotriene is a combination of the 5-fluorouracil  cream with a vitamin D analog cream called calcipotriene. The calcipotriene alone does not treat actinic keratoses. However, when it is combined with 5-fluorouracil , it helps the 5-fluorouracil  treat the actinic keratoses much faster so that the same results can be achieved with a much shorter treatment time.  INSTRUCTIONS FOR 5-FLUOROURACIL /CALCIPOTRIENE CREAM:   5-fluorouracil /calcipotriene cream typically only needs to be used for 4-7 days. A thin layer should be applied twice a day to the treatment areas recommended by your physician.   If your physician prescribed you separate tubes of 5-fluourouracil and calcipotriene, apply a thin layer of 5-fluorouracil  followed by a thin layer of calcipotriene.   Avoid contact with your eyes, nostrils, and mouth. Do not use 5-fluorouracil /calcipotriene cream on infected or open wounds.   You will develop redness, irritation and some crusting at areas where you have pre-cancer damage/actinic keratoses. IF YOU DEVELOP PAIN, BLEEDING, OR SIGNIFICANT CRUSTING, STOP  THE TREATMENT EARLY - you have already gotten a good response and the actinic keratoses should clear up well.  Wash your hands after applying 5-fluorouracil  5% cream on your skin.   A moisturizer or sunscreen with a minimum SPF 30 should be applied each morning.   Once you have finished the treatment, you can apply a thin layer of Vaseline twice a day to irritated areas to soothe and calm the areas more quickly. If you experience significant discomfort, contact your physician.  For some patients it is necessary to repeat the treatment for best results.  SIDE EFFECTS: When using 5-fluorouracil /calcipotriene cream, you may have mild irritation, such as redness, dryness, swelling, or a mild burning sensation. This usually resolves within 2 weeks. The more actinic keratoses you have, the more redness and inflammation you can expect during treatment. Eye irritation has been reported rarely. If this occurs, please let us  know.  If you have any trouble using this cream, please call the office. If you have any other questions about this information, please do not hesitate to ask me before you leave the office.   Instructions for [facility] Medications  One or more of your medications was sent to the [facility] mail order compounding pharmacy. You will receive an email from them and can purchase the medicine through that link. It will then be mailed to your home at the address you confirmed. If for any reason you do not receive an email from them, please check your spam folder. If you still do not find the email, please let us  know. [facility] phone number is [phone].      Due to recent changes in healthcare laws, you may see results of  your pathology and/or laboratory studies on MyChart before the doctors have had a chance to review them. We understand that in some cases there may be results that are confusing or concerning to you. Please understand that not all results are received  at the same time and often the doctors may need to interpret multiple results in order to provide you with the best plan of care or course of treatment. Therefore, we ask that you please give us  2 business days to thoroughly review all your results before contacting the office for clarification. Should we see a critical lab result, you will be contacted sooner.   If You Need Anything After Your Visit  If you have any questions or concerns for your doctor, please call our main line at [phone] and press option 4 to reach your doctor's medical assistant. If no one answers, please leave a voicemail as directed and we will return your call as soon as possible. Messages left after 4 pm will be answered the following business day.   You may also send us  a message via MyChart. We typically respond to MyChart messages within 1-2 business days.  For prescription refills, please ask your pharmacy to contact our office. Our fax number is [phone].  If you have an urgent issue when the clinic is closed that cannot wait until the next business day, you can page your doctor at the number below.    Please note that while we do our best to be available for urgent issues outside of office hours, we are not available 24/7.   If you have an urgent issue and are unable to reach us , you may choose to seek medical care at your doctor's office, retail clinic, urgent care center, or emergency room.  If you have a medical emergency, please immediately call 911 or go to the emergency department.  Pager Numbers  - Dr. Hester: [phone]  - Dr. Jackquline: [phone]  - Dr. Claudene: [phone]   In the event of inclement weather, please call our main line at [phone] for an update on the status of any delays or closures.  Dermatology Medication Tips: Please keep the boxes that topical medications come in in order to help keep track of the instructions about where and how to use these. Pharmacies  typically print the medication instructions only on the boxes and not directly on the medication tubes.   If your medication is too expensive, please contact our office at [phone] option 4 or send us  a message through MyChart.   We are unable to tell what your co-pay for medications will be in advance as this is different depending on your insurance coverage. However, we may be able to find a substitute medication at lower cost or fill out paperwork to get insurance to cover a needed medication.   If a prior authorization is required to get your medication covered by your insurance company, please allow us  1-2 business days to complete this process.  Drug prices often vary depending on where the prescription is filled and some pharmacies may offer cheaper prices.  The website www.goodrx.com contains coupons for medications through different pharmacies. The prices here do not account for what the cost may be with help from insurance (it may be cheaper with your insurance), but the website can give you the price if you did not use any insurance.  - You can print the associated coupon and take it with your prescription to the pharmacy.  - You may also  stop by our office during regular business hours and pick up a GoodRx coupon card.  - If you need your prescription sent electronically to a different pharmacy, notify our office through [facility] or by phone at [phone] option 4.     Si Usted Necesita Algo Despus de Su Visita  Tambin puede enviarnos un mensaje a travs de Clinical cytogeneticist. Por lo general respondemos a los mensajes de MyChart en el transcurso de 1 a 2 das hbiles.  Para renovar recetas, por favor pida a su farmacia que se ponga en contacto con nuestra oficina. Randi lakes de fax es [place] [phone].  Si tiene un asunto urgente cuando la clnica est cerrada y que no puede esperar hasta el siguiente da hbil, puede llamar/localizar a su doctor(a) al nmero que  aparece a continuacin.   Por favor, tenga en cuenta que aunque hacemos todo lo posible para estar disponibles para asuntos urgentes fuera del horario de [place], no estamos disponibles las 24 horas del da, los 7 [address]  [address] de la [place].   Si tiene un problema urgente y no puede comunicarse con nosotros, puede optar por buscar atencin mdica  en el consultorio de su doctor(a), en una clnica privada, en un centro de atencin urgente o en una sala de emergencias.  Si tiene Engineer, drilling, por favor llame inmediatamente al 911 o vaya a la sala de emergencias.  Nmeros de bper  - Dr. Hester: [phone]  - Dra. Jackquline: [phone]  - Dr. Claudene: [phone]   En caso de inclemencias del tiempo, por favor llame a landry capes principal al [phone] para una actualizacin sobre el [place] de cualquier retraso o cierre.  Consejos para la medicacin en dermatologa: Por favor, guarde las cajas en las que vienen los medicamentos de uso tpico para ayudarle a seguir las instrucciones sobre dnde y cmo usarlos. Las farmacias generalmente imprimen las instrucciones del medicamento slo en las cajas y no directamente en los tubos del [place].   Si su medicamento es muy caro, por favor, pngase en contacto con landry rieger llamando al [phone] y presione la opcin 4 o envenos un mensaje a travs de Clinical cytogeneticist.   No podemos decirle cul ser su copago por los medicamentos por adelantado ya que esto es diferente dependiendo de la cobertura de su seguro. Sin embargo, es posible que podamos encontrar un medicamento sustituto a Audiological scientist un formulario para que el seguro cubra el medicamento que se considera necesario.   Si se requiere una autorizacin previa para que su compaa de seguros malta su medicamento, por favor permtanos de 1 a 2 das hbiles para completar este proceso.  Los precios de los medicamentos varan con frecuencia dependiendo del Environmental consultant de dnde se surte  la receta y alguna farmacias pueden ofrecer precios ms baratos.  El sitio web www.goodrx.com tiene cupones para medicamentos de Health and safety inspector. Los precios aqu no tienen en cuenta lo que podra costar con la ayuda del seguro (puede ser ms barato con su seguro), pero el sitio web puede darle el precio si no utiliz Tourist information centre manager.  - Puede imprimir el cupn correspondiente y llevarlo con su receta a la farmacia.  - Tambin puede pasar por nuestra oficina durante el horario de atencin regular y Education officer, museum una tarjeta de cupones de GoodRx.  - Si necesita que su receta se enve electrnicamente a Psychiatrist, informe a nuestra oficina a travs de MyChart de [place] o por telfono llamando al [phone] y presione la  opcin 4.

## 2023-09-28 NOTE — Therapy (Signed)
 OUTPATIENT PHYSICAL THERAPY TREATMENT   Patient Name: Todd Farley MRN: [id number] DOB:01/05/1946, 78 y.o., male Today's Date: 09/28/2023  END OF SESSION:  PT End of Session - 09/28/23 0934     Visit Number 2    Number of Visits 24    Date for PT Re-Evaluation 11/05/23    Authorization Type Medicare A&B; AARP secondary    Progress Note Due on Visit 10    PT Start Time 0930    PT Stop Time 1010    PT Time Calculation (min) 40 min    Equipment Utilized During Treatment Gait belt    Activity Tolerance Patient tolerated treatment well;No increased pain    Behavior During Therapy WFL for tasks assessed/performed          Past Medical History:  Diagnosis Date   Actinic keratosis    Anemia    Arthritis    Bronchial stenosis, right    Cancer (HCC)    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   Chronic bronchitis (HCC)    Diabetes mellitus without complication (HCC)    GERD (gastroesophageal reflux disease)    High cholesterol    History of kidney stones    Hypertension    Neuropathy    Pneumonia    Umbilical hernia    Past Surgical History:  Procedure Laterality Date   BRONCHIAL WASHINGS N/A 01/16/2023   Procedure: BRONCHIAL WASHINGS;  Surgeon: Parris Manna, MD;  Location: [facility] ORS;  Service: Thoracic;  Laterality: N/A;   CARPAL TUNNEL RELEASE Right    COLONOSCOPY WITH PROPOFOL  N/A 06/25/2015   Procedure: COLONOSCOPY WITH PROPOFOL ;  Surgeon: Lamar ONEIDA Holmes, MD;  Location: [facility] ENDOSCOPY;  Service: Endoscopy;  Laterality: N/A;   ESOPHAGOGASTRODUODENOSCOPY (EGD) WITH PROPOFOL  N/A 06/25/2015   Procedure: ESOPHAGOGASTRODUODENOSCOPY (EGD) WITH PROPOFOL ;  Surgeon: Lamar ONEIDA Holmes, MD;  Location: [facility] ENDOSCOPY;  Service: Endoscopy;  Laterality: N/A;   EYE SURGERY     eyelids x2   EYE SURGERY     cataract x 2 - intraocular implants   FLEXIBLE BRONCHOSCOPY N/A 01/16/2023   Procedure: FLEXIBLE BRONCHOSCOPY;  Surgeon: Parris Manna, MD;  Location: [facility] ORS;  Service: Thoracic;   Laterality: N/A;   HERNIA REPAIR     inguinal   JOINT REPLACEMENT Right    knee   KNEE ARTHROSCOPY Right    acl repair   TOTAL HIP ARTHROPLASTY Left 09/07/2023   Procedure: ARTHROPLASTY, HIP, TOTAL, ANTERIOR APPROACH;  Surgeon: Lorelle Hussar, MD;  Location: [facility] ORS;  Service: Orthopedics;  Laterality: Left;  spinal with sedation   Patient Active Problem List   Diagnosis Date Noted   S/P total left hip arthroplasty 09/07/2023   Umbilical hernia without obstruction and without gangrene 01/26/2018   Diabetic polyneuropathy associated with type 2 diabetes mellitus (HCC) 10/13/2017   GERD (gastroesophageal reflux disease) 04/13/2015   Diabetes mellitus type 2, uncomplicated (HCC) 03/14/2014   Hypertension 03/14/2014   PCP: Dr. Norleen Rower  REFERRING PROVIDER: Lonni Amber, PA-C  REFERRING DIAG: S/P Left Total Hip Arthorplasty  THERAPY DIAG:  Muscle weakness (generalized)  Difficulty in walking, not elsewhere classified  Abnormality of gait and mobility  Unsteadiness on feet  Status post total hip replacement, left  Rationale for Evaluation and Treatment: Rehabilitation  ONSET DATE: surgery on 09/07/23  SUBJECTIVE:   SUBJECTIVE STATEMENT: Hip feeling good still. Today he is 1 week post treatment for breathing dysfunction, feels this is improving as well. Still working on his HEP daily from HHPT.   PERTINENT HISTORY: S/p Left  THA (Anterior approach) by Dr. Lorelle on 09/07/2023- due to OA with pain worsening. Pt also has some bronchial trunk scarring which may require surgery once he recovers from his hip surgery.  PAIN:  Are you having pain? <1/10 Left hip pain   PRECAUTIONS: No precautions  WEIGHT BEARING RESTRICTIONS: WBAT  FALLS:  Has patient fallen in last 6 months? No  LIVING ENVIRONMENT: Lives with: lives with their spouse Lives in: House/apartment Stairs: Yes: Internal: 15 steps; on left going up and External: 3 steps; on right going up Has  following equipment at home: Single point cane, Walker - 2 wheeled, shower chair, and bed side commode  OCCUPATION: Retired  PLOF: Independent  PATIENT GOALS: Recover- improve my strength and balance  NEXT MD VISIT: Dr. Lorelle- End of July  OBJECTIVE:  Note: Objective measures were completed at evaluation unless otherwise noted.  PATIENT SURVEYS:  LEFS eval: 37  LOWER EXTREMITY ROM:  Active ROM Right eval Left eval  Hip flexion    Hip extension    Hip abduction    Hip adduction    Hip internal rotation    Hip external rotation     (Blank rows = not tested)  LOWER EXTREMITY MMT:  MMT Right eval Left eval  Hip flexion 5 4  Hip extension 5 4  Hip abduction 5 4  Hip adduction 5 4  Hip internal rotation 5 NT  Hip external rotation 5 NT  Knee flexion 5 4  Knee extension 5 4  Ankle dorsiflexion 5 4     FUNCTIONAL TESTS:  5 times sit to stand: 21.82 sec without UE support Timed up and go (TUG): 16.82 sec with use of SPC 6 minute walk test: 940ft AMB in 53m6s on 09/28/23 )(SPC, 1 LOB)  10 meter walk test: 0.99m/s on 09/28/23 Berg Balance Scale: 45/56 at eval                                                                                                                               TREATMENT DATE 09/28/23   -overground AMB, : 957ft in 28m4s c SPC, 1 LOB, pain at goal -10xSTS from chair hands free -10xSTS from chair hands free -Hooklying bridge x10  -Hooklying bridge x10 -Review of HEP handout  -backward AMB without device, min Guard assist 4x44ft   PATIENT EDUCATION:  Education details: Purpose of PT: Review of hip replacement and any precautions; Review of HEP from HHPT.  Person educated: Patient Education method: Explanation, Demonstration, Tactile cues, and Verbal cues Education comprehension: verbalized understanding, returned demonstration, verbal cues required, tactile cues required, and needs further education  HOME EXERCISE PROGRAM: Access Code:  [facility] URL: [email or web address] Date: 09/28/2023 Prepared by: Peggye Linear  Exercises - Sit to Stand with Arms Crossed  - 1 x daily - 2 sets - 10 reps - Supine Bridge  - 1 x daily - 2 sets - 10 reps - Side Stepping with Counter Support  -  1 x daily - 2 sets - 10 reps - Side Stepping with Counter Support  - 1 x daily - 2 sets - 20 reps - Seated Long Arc Quad  - 1 x daily - 2 sets - 15 reps - 5 hold - Heel Raises with Counter Support  - 1 x daily - 3 sets - 15 reps - Standing March with Counter Support  - 1 x daily - 2 sets - 20 reps   ASSESSMENT:  CLINICAL IMPRESSION: Completed walk tests and established HEP. Pt continued to show improved overground AMB tolerance. Pt will benefit from PT services to address deficits in strength, mobility, and pain in order to return to full function at home with less knee pain  OBJECTIVE IMPAIRMENTS: Abnormal gait, decreased activity tolerance, decreased balance, decreased coordination, decreased endurance, decreased mobility, difficulty walking, decreased strength, and pain.  ACTIVITY LIMITATIONS: carrying, lifting, bending, sitting, standing, squatting, and stairs PARTICIPATION LIMITATIONS: meal prep, cleaning, laundry, shopping, community activity, and yard work PERSONAL FACTORS: 1-2 comorbidities: arthritis and bronchial issues are also affecting patient's functional outcome.  REHAB POTENTIAL: Good CLINICAL DECISION MAKING: Stable/uncomplicated EVALUATION COMPLEXITY: Low  GOALS: Goals reviewed with patient? Yes  SHORT TERM GOALS: Target date: 11/05/2023 Pt will be independent with HEP in order to decrease ankle pain and increase strength in order to improve pain-free function at home.  Baseline: EVAL- Will need updated HEP as he progresses and currently performing supine and standing from HHPT.  Goal status: INITIAL  LONG TERM GOALS: Target date: 12/17/2023  Pt will increase LEFS by at least 9 points in order to demonstrate  significant improvement in lower extremity function.   Baseline: EVAL= 37/80 Goal status: INITIAL  2.  Pt will decrease worst pain in left hip as reported on NPRS by to 1/10 or no pain in order to demonstrate clinically significant reduction in ankle/foot pain. Baseline: EVAL= 1-2/10 Goal status: INITIAL  3.  Patient will improve his SLS balance by 5 sec on Left LE for improved hip stability with standing/walking.  Baseline: EVAL - 1-2 sec hold only on LLE Goal status: INITIAL  4.  Pt will improve BERG by at least 3 points in order to demonstrate clinically significant improvement in balance. Baseline: EVAL= 46/56 Goal status: INITIAL  5.  Pt will decrease 5TSTS by at least 7 seconds in order to demonstrate clinically significant improvement in LE strength. Baseline: EVAL= 21.82 sec without UE support Goal status: INITIAL  6.  Pt will decrease TUG to below 14 seconds/decrease in order to demonstrate decreased fall risk. Baseline: EVAL= Pt will decrease TUG to below 14 seconds/decrease in order to demonstrate decreased fall risk. Goal status: INITIAL   PLAN:  PT FREQUENCY: 1-2x/week  PT DURATION: 12 weeks  PLANNED INTERVENTIONS: 97164- PT Re-evaluation, 97110-Therapeutic exercises, 97530- Therapeutic activity, 97112- Neuromuscular re-education, 97535- Self Care, 02859- Manual therapy, [phone]- Gait training, [phone]- Orthotic Initial, [phone]- Orthotic/Prosthetic subsequent, [phone]- Canalith repositioning, [phone]- Electrical stimulation (manual), [id number]- Ultrasound, [phone] (1-2 muscles), 20561 (3+ muscles)- Dry Needling, Patient/Family education, Balance training, Stair training, Taping, Joint mobilization, Joint manipulation, Spinal manipulation, Spinal mobilization, Scar mobilization, Vestibular training, DME instructions, Cryotherapy, and Moist heat  PLAN FOR NEXT SESSION:  FU on HEP feedback, continue with basic strengthing of legs, gait-based balance training, overground gait  tolerance  10:07 AM, 09/28/23 Peggye JAYSON Linear, PT, DPT Physical Therapist - [facility]  Outpatient Physical Therapy- Main Campus [phone]     Brady C, PT 09/28/2023, 9:39 AM

## 2023-09-28 NOTE — Progress Notes (Signed)
 Follow-Up Visit   Subjective  Todd Farley is a 78 y.o. male who presents for the following: Skin Cancer Screening and Upper Body Skin Exam, hx of precancers on his scalp.   The patient presents for Upper Body Skin Exam (UBSE) for skin cancer screening and mole check. The patient has spots, moles and lesions to be evaluated, some may be new or changing and the patient may have concern these could be cancer.  The following portions of the chart were reviewed this encounter and updated as appropriate: medications, allergies, medical history  Review of Systems:  No other skin or systemic complaints except as noted in HPI or Assessment and Plan.  Objective  Well appearing patient in no apparent distress; mood and affect are within normal limits.  All skin waist up examined. Relevant physical exam findings are noted in the Assessment and Plan.  Scalp x 10 (10) Erythematous thin papules/macules with gritty scale.   Assessment & Plan   AK (ACTINIC KERATOSIS) (10) Scalp x 10 (10) ACTINIC DAMAGE WITH PRECANCEROUS ACTINIC KERATOSES Counseling for Topical Chemotherapy Management: Patient exhibits: - Severe, confluent actinic changes with pre-cancerous actinic keratoses that is secondary to cumulative UV radiation exposure over time - Condition that is severe; chronic, not at goal. - diffuse scaly erythematous macules and papules with underlying dyspigmentation - Discussed Prescription Field Treatment topical Chemotherapy for Severe, Chronic Confluent Actinic Changes with Pre-Cancerous Actinic Keratoses Field treatment involves treatment of an entire area of skin that has confluent Actinic Changes (Sun/ Ultraviolet light damage) and PreCancerous Actinic Keratoses by method of PhotoDynamic Therapy (PDT) and/or prescription Topical Chemotherapy agents such as 5-fluorouracil , 5-fluorouracil /calcipotriene, and/or imiquimod.  The purpose is to decrease the number of clinically evident and  subclinical PreCancerous lesions to prevent progression to development of skin cancer by chemically destroying early precancer changes that may or may not be visible.  It has been shown to reduce the risk of developing skin cancer in the treated area. As a result of treatment, redness, scaling, crusting, and open sores may occur during treatment course. One or more than one of these methods may be used and may have to be used several times to control, suppress and eliminate the PreCancerous changes. Discussed treatment course, expected reaction, and possible side effects. - Recommend daily broad spectrum sunscreen SPF 30+ to sun-exposed areas, reapply every 2 hours as needed.  - Staying in the shade or wearing long sleeves, sun glasses (UVA+UVB protection) and wide brim hats (4-inch brim around the entire circumference of the hat) are also recommended. - Call for new or changing lesions.   In September  Start Fluorouracil  5%/Calcipotriene cream - twice a day for 10  days to scalp   Destruction of lesion - Scalp x 10 (10) Complexity: simple   Destruction method: cryotherapy   Informed consent: discussed and consent obtained   Timeout:  patient name, date of birth, surgical site, and procedure verified Lesion destroyed using liquid nitrogen: Yes   Region frozen until ice ball extended beyond lesion: Yes   Outcome: patient tolerated procedure well with no complications   Post-procedure details: wound care instructions given    Skin cancer screening performed today.  Lentigines, Seborrheic Keratoses, Hemangiomas - Benign normal skin lesions - Benign-appearing - Call for any changes  Melanocytic Nevi - Tan-brown and/or pink-flesh-colored symmetric macules and papules - Benign appearing on exam today - Observation - Call clinic for new or changing moles - Recommend daily use of broad spectrum spf 30+ sunscreen  to sun-exposed areas.    Return in about 1 year (around 09/27/2024) for UBSE, hx  of AKs .  IFay Kirks, CMA, am acting as scribe for Alm Rhyme, MD .   Documentation: I have reviewed the above documentation for accuracy and completeness, and I agree with the above.  Alm Rhyme, MD

## 2023-09-30 ENCOUNTER — Ambulatory Visit: Attending: Orthopedic Surgery

## 2023-09-30 DIAGNOSIS — Z96642 Presence of left artificial hip joint: Secondary | ICD-10-CM | POA: Insufficient documentation

## 2023-09-30 DIAGNOSIS — R269 Unspecified abnormalities of gait and mobility: Secondary | ICD-10-CM | POA: Insufficient documentation

## 2023-09-30 DIAGNOSIS — M6281 Muscle weakness (generalized): Secondary | ICD-10-CM | POA: Insufficient documentation

## 2023-09-30 DIAGNOSIS — R262 Difficulty in walking, not elsewhere classified: Secondary | ICD-10-CM | POA: Diagnosis present

## 2023-09-30 DIAGNOSIS — R2681 Unsteadiness on feet: Secondary | ICD-10-CM | POA: Diagnosis present

## 2023-09-30 NOTE — Therapy (Signed)
 OUTPATIENT PHYSICAL THERAPY TREATMENT   Patient Name: Todd Farley MRN: [id number] DOB:[birth year], 78 y.o., male Today's Date: 09/30/2023  END OF SESSION:  PT End of Session - 09/30/23 1544     Visit Number 3    Number of Visits 24    Date for PT Re-Evaluation 11/05/23    Authorization Type Medicare A&B; [employer] secondary    Progress Note Due on Visit 10    PT Start Time 1530    PT Stop Time 1610    PT Time Calculation (min) 40 min    Equipment Utilized During Treatment Gait belt    Activity Tolerance Patient tolerated treatment well;No increased pain    Behavior During Therapy WFL for tasks assessed/performed          Past Medical History:  Diagnosis Date   Actinic keratosis    Anemia    Arthritis    Bronchial stenosis, right    Cancer (HCC)    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   Chronic bronchitis (HCC)    Diabetes mellitus without complication (HCC)    GERD (gastroesophageal reflux disease)    High cholesterol    History of kidney stones    Hypertension    Neuropathy    Pneumonia    Umbilical hernia    Past Surgical History:  Procedure Laterality Date   BRONCHIAL WASHINGS N/A 01/16/2023   Procedure: BRONCHIAL WASHINGS;  Surgeon: Parris Manna, MD;  Location: [facility] ORS;  Service: Thoracic;  Laterality: N/A;   CARPAL TUNNEL RELEASE Right    COLONOSCOPY WITH PROPOFOL  N/A 06/25/2015   Procedure: COLONOSCOPY WITH PROPOFOL ;  Surgeon: Lamar ONEIDA Holmes, MD;  Location: [facility] ENDOSCOPY;  Service: Endoscopy;  Laterality: N/A;   ESOPHAGOGASTRODUODENOSCOPY (EGD) WITH PROPOFOL  N/A 06/25/2015   Procedure: ESOPHAGOGASTRODUODENOSCOPY (EGD) WITH PROPOFOL ;  Surgeon: Lamar ONEIDA Holmes, MD;  Location: [facility] ENDOSCOPY;  Service: Endoscopy;  Laterality: N/A;   EYE SURGERY     eyelids x2   EYE SURGERY     cataract x 2 - intraocular implants   FLEXIBLE BRONCHOSCOPY N/A 01/16/2023   Procedure: FLEXIBLE BRONCHOSCOPY;  Surgeon: Parris Manna, MD;  Location: [facility] ORS;  Service: Thoracic;   Laterality: N/A;   HERNIA REPAIR     inguinal   JOINT REPLACEMENT Right    knee   KNEE ARTHROSCOPY Right    acl repair   TOTAL HIP ARTHROPLASTY Left 09/07/2023   Procedure: ARTHROPLASTY, HIP, TOTAL, ANTERIOR APPROACH;  Surgeon: Lorelle Hussar, MD;  Location: [facility] ORS;  Service: Orthopedics;  Laterality: Left;  spinal with sedation   Patient Active Problem List   Diagnosis Date Noted   S/P total left hip arthroplasty 09/07/2023   Umbilical hernia without obstruction and without gangrene 01/26/2018   Diabetic polyneuropathy associated with type 2 diabetes mellitus (HCC) 10/13/2017   GERD (gastroesophageal reflux disease) 04/13/2015   Diabetes mellitus type 2, uncomplicated (HCC) 03/14/2014   Hypertension 03/14/2014   PCP: Dr. Norleen Rower  REFERRING PROVIDER: Lonni Amber, PA-C  REFERRING DIAG: S/P Left Total Hip Arthorplasty  THERAPY DIAG:  Muscle weakness (generalized)  Difficulty in walking, not elsewhere classified  Abnormality of gait and mobility  Unsteadiness on feet  Status post total hip replacement, left  Rationale for Evaluation and Treatment: Rehabilitation  ONSET DATE: surgery on 09/07/23  SUBJECTIVE:   SUBJECTIVE STATEMENT: Hip feeling good still. Walking improving overall. HEP updates going well so far.   PERTINENT HISTORY: S/p Left THA (Anterior approach) by Dr. Lorelle on 09/07/2023- due to OA with pain worsening. Pt  also has some bronchial trunk scarring which may require surgery once he recovers from his hip surgery.  PAIN:  Are you having pain? <1/10 Left hip pain   PRECAUTIONS: No precautions  WEIGHT BEARING RESTRICTIONS: WBAT  FALLS:  Has patient fallen in last 6 months? No  LIVING ENVIRONMENT: Lives with: lives with their spouse Lives in: House/apartment Stairs: Yes: Internal: 15 steps; on left going up and External: 3 steps; on right going up Has following equipment at home: Single point cane, Walker - 2 wheeled, shower chair, and  bed side commode  OCCUPATION: Retired  PLOF: Independent  PATIENT GOALS: Recover- improve my strength and balance  NEXT MD VISIT: Dr. Lorelle- End of July  OBJECTIVE:  Note: Objective measures were completed at evaluation unless otherwise noted.  PATIENT SURVEYS:  LEFS eval: 37  LOWER EXTREMITY MMT:  MMT Right eval Left eval  Hip flexion 5 4  Hip extension 5 4  Hip abduction 5 4  Hip adduction 5 4  Hip internal rotation 5 NT  Hip external rotation 5 NT  Knee flexion 5 4  Knee extension 5 4  Ankle dorsiflexion 5 4     FUNCTIONAL TESTS:  5 times sit to stand: 21.82 sec without UE support Timed up and go (TUG): 16.82 sec with use of SPC 6 minute walk test: 922ft AMB in 85m6s on 09/28/23 )(SPC, 1 LOB)  10 meter walk test: 0.49m/s on 09/28/23 Berg Balance Scale: 45/56 at eval                                                                                                                               TREATMENT DATE 09/30/23    -overground AMB, 955ft 84m59s c SPC, 0 LOB  -10xSTS from chair hands free -seated cable hamstrings flexion LLE 1x12 @ 7.5lb  -seated LAQ LE 1x12 @ 3lb AW around metatarsal joint line -seated RedTB clam 15x3secH   -10xSTS from chair hands free -seated cable hamstrings flexion LLE 1x12 @ 7.5lb  -seated LAQ LE 1x12 @ 3lb AW around metatarsal joint line -seated RedTB clam 15x3secH   -lateral stepping 2x77ft bilat c 5KG ball  -cable resisted AMB in 4 directions, twice each 12.5lb forward/backward; 7.5lb lateral *plentiful seated break between efforts   PATIENT EDUCATION:  Education details: Purpose of PT: Review of hip replacement and any precautions; Review of HEP from HHPT.  Person educated: Patient Education method: Explanation, Demonstration, Tactile cues, and Verbal cues Education comprehension: verbalized understanding, returned demonstration, verbal cues required, tactile cues required, and needs further education  HOME EXERCISE  PROGRAM: Access Code: [facility] URL: [email or web address] Date: 09/28/2023 Prepared by: Peggye Linear  Exercises - Sit to Stand with Arms Crossed  - 1 x daily - 2 sets - 10 reps - Supine Bridge  - 1 x daily - 2 sets - 10 reps - Side Stepping with Counter Support  - 1 x daily - 2 sets - 10 reps -  Side Stepping with Counter Support  - 1 x daily - 2 sets - 20 reps - Seated Long Arc Quad  - 1 x daily - 2 sets - 15 reps - 5 hold - Heel Raises with Counter Support  - 1 x daily - 3 sets - 15 reps - Standing March with Counter Support  - 1 x daily - 2 sets - 20 reps   ASSESSMENT:  CLINICAL IMPRESSION: Pt continues to show improved overground AMB tolerance. Advanced loading for most exercises, excellent tolerance overall. Pain remains at goal. Pt will benefit from PT services to address deficits in strength, mobility, and pain in order to return to full function at home with less knee pain.  OBJECTIVE IMPAIRMENTS: Abnormal gait, decreased activity tolerance, decreased balance, decreased coordination, decreased endurance, decreased mobility, difficulty walking, decreased strength, and pain.  ACTIVITY LIMITATIONS: carrying, lifting, bending, sitting, standing, squatting, and stairs PARTICIPATION LIMITATIONS: meal prep, cleaning, laundry, shopping, community activity, and yard work PERSONAL FACTORS: 1-2 comorbidities: arthritis and bronchial issues are also affecting patient's functional outcome.  REHAB POTENTIAL: Good CLINICAL DECISION MAKING: Stable/uncomplicated EVALUATION COMPLEXITY: Low  GOALS: Goals reviewed with patient? Yes  SHORT TERM GOALS: Target date: 11/05/2023 Pt will be independent with HEP in order to decrease ankle pain and increase strength in order to improve pain-free function at home.  Baseline: EVAL- Will need updated HEP as he progresses and currently performing supine and standing from HHPT.  Goal status: INITIAL  LONG TERM GOALS: Target date:  12/17/2023  Pt will increase LEFS by at least 9 points in order to demonstrate significant improvement in lower extremity function.   Baseline: EVAL= 37/80 Goal status: INITIAL  2.  Pt will decrease worst pain in left hip as reported on NPRS by to 1/10 or no pain in order to demonstrate clinically significant reduction in ankle/foot pain. Baseline: EVAL= 1-2/10 Goal status: INITIAL  3.  Patient will improve his SLS balance by 5 sec on Left LE for improved hip stability with standing/walking.  Baseline: EVAL - 1-2 sec hold only on LLE Goal status: INITIAL  4.  Pt will improve BERG by at least 3 points in order to demonstrate clinically significant improvement in balance. Baseline: EVAL= 46/56 Goal status: INITIAL  5.  Pt will decrease 5TSTS by at least 7 seconds in order to demonstrate clinically significant improvement in LE strength. Baseline: EVAL= 21.82 sec without UE support Goal status: INITIAL  6.  Pt will decrease TUG to below 14 seconds/decrease in order to demonstrate decreased fall risk. Baseline: EVAL= Pt will decrease TUG to below 14 seconds/decrease in order to demonstrate decreased fall risk. Goal status: INITIAL  PLAN:  PT FREQUENCY: 1-2x/week  PT DURATION: 12 weeks  PLANNED INTERVENTIONS: 97164- PT Re-evaluation, 97110-Therapeutic exercises, 97530- Therapeutic activity, 97112- Neuromuscular re-education, 97535- Self Care, 02859- Manual therapy, [phone]- Gait training, [phone]- Orthotic Initial, [phone]- Orthotic/Prosthetic subsequent, [phone]- Canalith repositioning, [phone]- Electrical stimulation (manual), [id number]- Ultrasound, [phone] (1-2 muscles), [phone] (3+ muscles)- Dry Needling, Patient/Family education, Balance training, Stair training, Taping, Joint mobilization, Joint manipulation, Spinal manipulation, Spinal mobilization, Scar mobilization, Vestibular training, DME instructions, Cryotherapy, and Moist heat  PLAN FOR NEXT SESSION:  Slow progressive loading program    3:49 PM, 09/30/23 Peggye JAYSON Linear, PT, DPT Physical Therapist - [facility]  Outpatient Physical Therapy- [facility] [phone]     [place] C, PT 09/30/2023, 3:49 PM

## 2023-10-06 ENCOUNTER — Ambulatory Visit

## 2023-10-06 DIAGNOSIS — R2681 Unsteadiness on feet: Secondary | ICD-10-CM

## 2023-10-06 DIAGNOSIS — M6281 Muscle weakness (generalized): Secondary | ICD-10-CM

## 2023-10-06 DIAGNOSIS — R269 Unspecified abnormalities of gait and mobility: Secondary | ICD-10-CM

## 2023-10-06 DIAGNOSIS — R262 Difficulty in walking, not elsewhere classified: Secondary | ICD-10-CM

## 2023-10-06 NOTE — Therapy (Signed)
 OUTPATIENT PHYSICAL THERAPY TREATMENT   Patient Name: Todd Farley MRN: [id number] DOB:05-12-1945, 78 y.o., male Today's Date: 10/06/2023  END OF SESSION:  PT End of Session - 10/06/23 0931     Visit Number 4    Number of Visits 24    Date for PT Re-Evaluation 11/05/23    Authorization Type Medicare A&B; A[employer] secondary    Progress Note Due on Visit 10    PT Start Time 0930    PT Stop Time 1014    PT Time Calculation (min) 44 min    Equipment Utilized During Treatment Gait belt    Activity Tolerance Patient tolerated treatment well;No increased pain    Behavior During Therapy WFL for tasks assessed/performed           Past Medical History:  Diagnosis Date   Actinic keratosis    Anemia    Arthritis    Bronchial stenosis, right    Cancer (HCC)    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   Chronic bronchitis (HCC)    Diabetes mellitus without complication (HCC)    GERD (gastroesophageal reflux disease)    High cholesterol    History of kidney stones    Hypertension    Neuropathy    Pneumonia    Umbilical hernia    Past Surgical History:  Procedure Laterality Date   BRONCHIAL WASHINGS N/A 01/16/2023   Procedure: BRONCHIAL WASHINGS;  Surgeon: Parris Manna, MD;  Location: [facility] ORS;  Service: Thoracic;  Laterality: N/A;   CARPAL TUNNEL RELEASE Right    COLONOSCOPY WITH PROPOFOL  N/A 06/25/2015   Procedure: COLONOSCOPY WITH PROPOFOL ;  Surgeon: Lamar ONEIDA Holmes, MD;  Location: [facility] ENDOSCOPY;  Service: Endoscopy;  Laterality: N/A;   ESOPHAGOGASTRODUODENOSCOPY (EGD) WITH PROPOFOL  N/A 06/25/2015   Procedure: ESOPHAGOGASTRODUODENOSCOPY (EGD) WITH PROPOFOL ;  Surgeon: Lamar ONEIDA Holmes, MD;  Location: [facility] ENDOSCOPY;  Service: Endoscopy;  Laterality: N/A;   EYE SURGERY     eyelids x2   EYE SURGERY     cataract x 2 - intraocular implants   FLEXIBLE BRONCHOSCOPY N/A 01/16/2023   Procedure: FLEXIBLE BRONCHOSCOPY;  Surgeon: Parris Manna, MD;  Location: [facility] ORS;  Service: Thoracic;   Laterality: N/A;   HERNIA REPAIR     inguinal   JOINT REPLACEMENT Right    knee   KNEE ARTHROSCOPY Right    acl repair   TOTAL HIP ARTHROPLASTY Left 09/07/2023   Procedure: ARTHROPLASTY, HIP, TOTAL, ANTERIOR APPROACH;  Surgeon: Lorelle Hussar, MD;  Location: [facility] ORS;  Service: Orthopedics;  Laterality: Left;  spinal with sedation   Patient Active Problem List   Diagnosis Date Noted   S/P total left hip arthroplasty 09/07/2023   Umbilical hernia without obstruction and without gangrene 01/26/2018   Diabetic polyneuropathy associated with type 2 diabetes mellitus (HCC) 10/13/2017   GERD (gastroesophageal reflux disease) 04/13/2015   Diabetes mellitus type 2, uncomplicated (HCC) 03/14/2014   Hypertension 03/14/2014   PCP: Dr. Norleen Rower  REFERRING PROVIDER: Lonni Amber, PA-C  REFERRING DIAG: S/P Left Total Hip Arthorplasty  THERAPY DIAG:  Muscle weakness (generalized)  Difficulty in walking, not elsewhere classified  Abnormality of gait and mobility  Unsteadiness on feet  Rationale for Evaluation and Treatment: Rehabilitation  ONSET DATE: surgery on 09/07/23  SUBJECTIVE:   SUBJECTIVE STATEMENT: Patient reports his hip is feeling great, but now can tell his non surgical R hip is having pain.   PERTINENT HISTORY: S/p Left THA (Anterior approach) by Dr. Lorelle on 09/07/2023- due to OA with pain worsening. Pt  also has some bronchial trunk scarring which may require surgery once he recovers from his hip surgery.  PAIN:  Are you having pain? <1/10 Left hip pain   PRECAUTIONS: No precautions  WEIGHT BEARING RESTRICTIONS: WBAT  FALLS:  Has patient fallen in last 6 months? No  LIVING ENVIRONMENT: Lives with: lives with their spouse Lives in: House/apartment Stairs: Yes: Internal: 15 steps; on left going up and External: 3 steps; on right going up Has following equipment at home: Single point cane, Walker - 2 wheeled, shower chair, and bed side  commode  OCCUPATION: Retired  PLOF: Independent  PATIENT GOALS: Recover- improve my strength and balance  NEXT MD VISIT: Dr. Lorelle- End of July  OBJECTIVE:  Note: Objective measures were completed at evaluation unless otherwise noted.  PATIENT SURVEYS:  LEFS eval: 37  LOWER EXTREMITY MMT:  MMT Right eval Left eval  Hip flexion 5 4  Hip extension 5 4  Hip abduction 5 4  Hip adduction 5 4  Hip internal rotation 5 NT  Hip external rotation 5 NT  Knee flexion 5 4  Knee extension 5 4  Ankle dorsiflexion 5 4     FUNCTIONAL TESTS:  5 times sit to stand: 21.82 sec without UE support Timed up and go (TUG): 16.82 sec with use of SPC 6 minute walk test: 939ft AMB in 80m6s on 09/28/23 )(SPC, 1 LOB)  10 meter walk test: 0.28m/s on 09/28/23 Berg Balance Scale: 45/56 at eval                                                                                                                               TREATMENT DATE 10/06/23    At support bar: -squats 10x; 2 sets - 3 way hip hikes 10x each direction ; each LE -single leg stance 30 seconds each LE ;2 sets -4 step toe taps 10x each LE  -4 step; crane step up 10x each side -airex pad 4 step tandem stance 30 seconds each LE placement -posterior lunge 10x each LE ; modified  Adduction ball squeeze with ER/IR 15x  Adduction with LAQ 15x    PATIENT EDUCATION:  Education details: Purpose of PT: Review of hip replacement and any precautions; Review of HEP from HHPT.  Person educated: Patient Education method: Explanation, Demonstration, Tactile cues, and Verbal cues Education comprehension: verbalized understanding, returned demonstration, verbal cues required, tactile cues required, and needs further education  HOME EXERCISE PROGRAM: Access Code: [facility] URL: [email or web address] Date: 09/28/2023 Prepared by: Peggye Linear  Exercises - Sit to Stand with Arms Crossed  - 1 x daily - 2 sets - 10 reps -  Supine Bridge  - 1 x daily - 2 sets - 10 reps - Side Stepping with Counter Support  - 1 x daily - 2 sets - 10 reps - Side Stepping with Counter Support  - 1 x daily - 2 sets - 20 reps - Seated Long Arc Quad  -  1 x daily - 2 sets - 15 reps - 5 hold - Heel Raises with Counter Support  - 1 x daily - 3 sets - 15 reps - Standing March with Counter Support  - 1 x daily - 2 sets - 20 reps   ASSESSMENT:  CLINICAL IMPRESSION: Single leg stabilization requires UE support this session. Continued strengthening and coordination with focus on strength and mobility. Patient is highly motivated throughout session.  Pt will benefit from PT services to address deficits in strength, mobility, and pain in order to return to full function at home with less knee pain.  OBJECTIVE IMPAIRMENTS: Abnormal gait, decreased activity tolerance, decreased balance, decreased coordination, decreased endurance, decreased mobility, difficulty walking, decreased strength, and pain.  ACTIVITY LIMITATIONS: carrying, lifting, bending, sitting, standing, squatting, and stairs PARTICIPATION LIMITATIONS: meal prep, cleaning, laundry, shopping, community activity, and yard work PERSONAL FACTORS: 1-2 comorbidities: arthritis and bronchial issues are also affecting patient's functional outcome.  REHAB POTENTIAL: Good CLINICAL DECISION MAKING: Stable/uncomplicated EVALUATION COMPLEXITY: Low  GOALS: Goals reviewed with patient? Yes  SHORT TERM GOALS: Target date: 11/05/2023 Pt will be independent with HEP in order to decrease ankle pain and increase strength in order to improve pain-free function at home.  Baseline: EVAL- Will need updated HEP as he progresses and currently performing supine and standing from HHPT.  Goal status: INITIAL  LONG TERM GOALS: Target date: 12/17/2023  Pt will increase LEFS by at least 9 points in order to demonstrate significant improvement in lower extremity function.   Baseline: EVAL= 37/80 Goal status:  INITIAL  2.  Pt will decrease worst pain in left hip as reported on NPRS by to 1/10 or no pain in order to demonstrate clinically significant reduction in ankle/foot pain. Baseline: EVAL= 1-2/10 Goal status: INITIAL  3.  Patient will improve his SLS balance by 5 sec on Left LE for improved hip stability with standing/walking.  Baseline: EVAL - 1-2 sec hold only on LLE Goal status: INITIAL  4.  Pt will improve BERG by at least 3 points in order to demonstrate clinically significant improvement in balance. Baseline: EVAL= 46/56 Goal status: INITIAL  5.  Pt will decrease 5TSTS by at least 7 seconds in order to demonstrate clinically significant improvement in LE strength. Baseline: EVAL= 21.82 sec without UE support Goal status: INITIAL  6.  Pt will decrease TUG to below 14 seconds/decrease in order to demonstrate decreased fall risk. Baseline: EVAL= Pt will decrease TUG to below 14 seconds/decrease in order to demonstrate decreased fall risk. Goal status: INITIAL  PLAN:  PT FREQUENCY: 1-2x/week  PT DURATION: 12 weeks  PLANNED INTERVENTIONS: 97164- PT Re-evaluation, 97110-Therapeutic exercises, 97530- Therapeutic activity, [id number]- Neuromuscular re-education, 97535- Self Care, 02859- Manual therapy, [id number]- Gait training, [phone]- Orthotic Initial, [phone]- Orthotic/Prosthetic subsequent, [phone]- Canalith repositioning, [phone]- Electrical stimulation (manual), [id number]- Ultrasound, [phone] (1-2 muscles), 20561 (3+ muscles)- Dry Needling, Patient/Family education, Balance training, Stair training, Taping, Joint mobilization, Joint manipulation, Spinal manipulation, Spinal mobilization, Scar mobilization, Vestibular training, DME instructions, Cryotherapy, and Moist heat  PLAN FOR NEXT SESSION:  Slow progressive loading program   10:35 AM, 10/06/23   Jayni Prescher, PT 10/06/2023, 10:35 AM

## 2023-10-08 ENCOUNTER — Ambulatory Visit: Admitting: Physical Therapy

## 2023-10-08 DIAGNOSIS — M6281 Muscle weakness (generalized): Secondary | ICD-10-CM

## 2023-10-08 DIAGNOSIS — R2681 Unsteadiness on feet: Secondary | ICD-10-CM

## 2023-10-08 DIAGNOSIS — Z96642 Presence of left artificial hip joint: Secondary | ICD-10-CM

## 2023-10-08 DIAGNOSIS — R262 Difficulty in walking, not elsewhere classified: Secondary | ICD-10-CM

## 2023-10-08 DIAGNOSIS — R269 Unspecified abnormalities of gait and mobility: Secondary | ICD-10-CM

## 2023-10-08 NOTE — Therapy (Signed)
 OUTPATIENT PHYSICAL THERAPY TREATMENT   Patient Name: Todd Farley MRN: [id number] DOB:18-Jan-1946, 78 y.o., male Today's Date: 10/08/2023  END OF SESSION:  PT End of Session - 10/08/23 1314     Visit Number 5    Number of Visits 24    Date for PT Re-Evaluation 11/05/23    Authorization Type Medicare A&B; [employer] secondary    Progress Note Due on Visit 10    PT Start Time 1317    PT Stop Time 1357    PT Time Calculation (min) 40 min    Equipment Utilized During Treatment Gait belt    Activity Tolerance Patient tolerated treatment well;No increased pain    Behavior During Therapy WFL for tasks assessed/performed           Past Medical History:  Diagnosis Date   Actinic keratosis    Anemia    Arthritis    Bronchial stenosis, right    Cancer (HCC)    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   Chronic bronchitis (HCC)    Diabetes mellitus without complication (HCC)    GERD (gastroesophageal reflux disease)    High cholesterol    History of kidney stones    Hypertension    Neuropathy    Pneumonia    Umbilical hernia    Past Surgical History:  Procedure Laterality Date   BRONCHIAL WASHINGS N/A 01/16/2023   Procedure: BRONCHIAL WASHINGS;  Surgeon: Parris Manna, MD;  Location: [facility] ORS;  Service: Thoracic;  Laterality: N/A;   CARPAL TUNNEL RELEASE Right    COLONOSCOPY WITH PROPOFOL  N/A 06/25/2015   Procedure: COLONOSCOPY WITH PROPOFOL ;  Surgeon: Lamar ONEIDA Holmes, MD;  Location: [facility] ENDOSCOPY;  Service: Endoscopy;  Laterality: N/A;   ESOPHAGOGASTRODUODENOSCOPY (EGD) WITH PROPOFOL  N/A 06/25/2015   Procedure: ESOPHAGOGASTRODUODENOSCOPY (EGD) WITH PROPOFOL ;  Surgeon: Lamar ONEIDA Holmes, MD;  Location: [facility] ENDOSCOPY;  Service: Endoscopy;  Laterality: N/A;   EYE SURGERY     eyelids x2   EYE SURGERY     cataract x 2 - intraocular implants   FLEXIBLE BRONCHOSCOPY N/A 01/16/2023   Procedure: FLEXIBLE BRONCHOSCOPY;  Surgeon: Parris Manna, MD;  Location: [facility] ORS;  Service: Thoracic;   Laterality: N/A;   HERNIA REPAIR     inguinal   JOINT REPLACEMENT Right    knee   KNEE ARTHROSCOPY Right    acl repair   TOTAL HIP ARTHROPLASTY Left 09/07/2023   Procedure: ARTHROPLASTY, HIP, TOTAL, ANTERIOR APPROACH;  Surgeon: Lorelle Hussar, MD;  Location: [facility] ORS;  Service: Orthopedics;  Laterality: Left;  spinal with sedation   Patient Active Problem List   Diagnosis Date Noted   S/P total left hip arthroplasty 09/07/2023   Umbilical hernia without obstruction and without gangrene 01/26/2018   Diabetic polyneuropathy associated with type 2 diabetes mellitus (HCC) 10/13/2017   GERD (gastroesophageal reflux disease) 04/13/2015   Diabetes mellitus type 2, uncomplicated (HCC) 03/14/2014   Hypertension 03/14/2014   PCP: Dr. Norleen Rower  REFERRING PROVIDER: Lonni Amber, PA-C  REFERRING DIAG: S/P Left Total Hip Arthorplasty  THERAPY DIAG:  Muscle weakness (generalized)  Difficulty in walking, not elsewhere classified  Abnormality of gait and mobility  Unsteadiness on feet  Status post total hip replacement, left  Rationale for Evaluation and Treatment: Rehabilitation  ONSET DATE: surgery on 09/07/23  SUBJECTIVE:   SUBJECTIVE STATEMENT: Patient reports that his surgical hip is not hurting today. States that there are some twinges in the R hip 2.5/10 at worst. States that pain feels deep in the Socket.  Is still recovering from Bronchial infection.  Will be having consultation for potential surgery in lungs after the end of this month.   PERTINENT HISTORY: S/p Left THA (Anterior approach) by Dr. Lorelle on 09/07/2023- due to OA with pain worsening. Pt also has some bronchial trunk scarring which may require surgery once he recovers from his hip surgery.  PAIN:  Are you having pain? <1/10 Left hip pain   PRECAUTIONS: No precautions  WEIGHT BEARING RESTRICTIONS: WBAT  FALLS:  Has patient fallen in last 6 months? No  LIVING ENVIRONMENT: Lives with: lives  with their spouse Lives in: House/apartment Stairs: Yes: Internal: 15 steps; on left going up and External: 3 steps; on right going up Has following equipment at home: Single point cane, Walker - 2 wheeled, shower chair, and bed side commode  OCCUPATION: Retired  PLOF: Independent  PATIENT GOALS: Recover- improve my strength and balance  NEXT MD VISIT: Dr. Lorelle- End of July  OBJECTIVE:  Note: Objective measures were completed at evaluation unless otherwise noted.  PATIENT SURVEYS:  LEFS eval: 37  LOWER EXTREMITY MMT:  MMT Right eval Left eval  Hip flexion 5 4  Hip extension 5 4  Hip abduction 5 4  Hip adduction 5 4  Hip internal rotation 5 NT  Hip external rotation 5 NT  Knee flexion 5 4  Knee extension 5 4  Ankle dorsiflexion 5 4     FUNCTIONAL TESTS:  5 times sit to stand: 21.82 sec without UE support Timed up and go (TUG): 16.82 sec with use of SPC 6 minute walk test: 971ft AMB in 87m6s on 09/28/23 )(SPC, 1 LOB)  10 meter walk test: 0.47m/s on 09/28/23 Berg Balance Scale: 45/56 at eval                                                                                                                               TREATMENT DATE 10/08/23   Sit<>stand from arm chair 3 x 5 mild discomfort in the R hip on each set  UE support on mat table  - SLS 2  x30 sec bil  - Hip abduction/extension 2 x 12 bil  - squats 10x; 2 sets - lateral step ups x 12 bil 4 ste[ - Forward step up 2x 12 bil 4 step - hip abduction GTB 2x 15  - LE push down x 15 bil GTB  - Adduction ball squeeze with ER/IR 15x   Pt   PATIENT EDUCATION:  Education details: Purpose of PT: Review of hip replacement and any precautions; Review of HEP from HHPT.  Person educated: Patient Education method: Explanation, Demonstration, Tactile cues, and Verbal cues Education comprehension: verbalized understanding, returned demonstration, verbal cues required, tactile cues required, and needs further  education  HOME EXERCISE PROGRAM: Access Code: [facility] URL: [email or web address] Date: 09/28/2023 Prepared by: Peggye Linear  Exercises - Sit to Stand with Arms Crossed  - 1 x daily - 2 sets -  10 reps - Supine Bridge  - 1 x daily - 2 sets - 10 reps - Side Stepping with Counter Support  - 1 x daily - 2 sets - 10 reps - Side Stepping with Counter Support  - 1 x daily - 2 sets - 20 reps - Seated Long Arc Quad  - 1 x daily - 2 sets - 15 reps - 5 hold - Heel Raises with Counter Support  - 1 x daily - 3 sets - 15 reps - Standing March with Counter Support  - 1 x daily - 2 sets - 20 reps   ASSESSMENT:  CLINICAL IMPRESSION: Single leg stabilization requires UE support this session. Continued strengthening and coordination with focus on strength and mobility. Patient is highly motivated throughout session.  Pt will benefit from PT services to address deficits in strength, mobility, and pain in order to return to full function at home with less knee pain.  OBJECTIVE IMPAIRMENTS: Abnormal gait, decreased activity tolerance, decreased balance, decreased coordination, decreased endurance, decreased mobility, difficulty walking, decreased strength, and pain.  ACTIVITY LIMITATIONS: carrying, lifting, bending, sitting, standing, squatting, and stairs PARTICIPATION LIMITATIONS: meal prep, cleaning, laundry, shopping, community activity, and yard work PERSONAL FACTORS: 1-2 comorbidities: arthritis and bronchial issues are also affecting patient's functional outcome.  REHAB POTENTIAL: Good CLINICAL DECISION MAKING: Stable/uncomplicated EVALUATION COMPLEXITY: Low  GOALS: Goals reviewed with patient? Yes  SHORT TERM GOALS: Target date: 11/05/2023 Pt will be independent with HEP in order to decrease ankle pain and increase strength in order to improve pain-free function at home.  Baseline: EVAL- Will need updated HEP as he progresses and currently performing supine and standing from HHPT.   Goal status: INITIAL  LONG TERM GOALS: Target date: 12/17/2023  Pt will increase LEFS by at least 9 points in order to demonstrate significant improvement in lower extremity function.   Baseline: EVAL= 37/80 Goal status: INITIAL  2.  Pt will decrease worst pain in left hip as reported on NPRS by to 1/10 or no pain in order to demonstrate clinically significant reduction in ankle/foot pain. Baseline: EVAL= 1-2/10 Goal status: INITIAL  3.  Patient will improve his SLS balance by 5 sec on Left LE for improved hip stability with standing/walking.  Baseline: EVAL - 1-2 sec hold only on LLE Goal status: INITIAL  4.  Pt will improve BERG by at least 3 points in order to demonstrate clinically significant improvement in balance. Baseline: EVAL= 46/56 Goal status: INITIAL  5.  Pt will decrease 5TSTS by at least 7 seconds in order to demonstrate clinically significant improvement in LE strength. Baseline: EVAL= 21.82 sec without UE support Goal status: INITIAL  6.  Pt will decrease TUG to below 14 seconds/decrease in order to demonstrate decreased fall risk. Baseline: EVAL= Pt will decrease TUG to below 14 seconds/decrease in order to demonstrate decreased fall risk. Goal status: INITIAL  PLAN:  PT FREQUENCY: 1-2x/week  PT DURATION: 12 weeks  PLANNED INTERVENTIONS: 97164- PT Re-evaluation, 97110-Therapeutic exercises, 97530- Therapeutic activity, [id number]- Neuromuscular re-education, 97535- Self Care, 02859- Manual therapy, [id number]- Gait training, [phone]- Orthotic Initial, [phone]- Orthotic/Prosthetic subsequent, [phone]- Canalith repositioning, [phone]- Electrical stimulation (manual), [id number]- Ultrasound, [phone] (1-2 muscles), 20561 (3+ muscles)- Dry Needling, Patient/Family education, Balance training, Stair training, Taping, Joint mobilization, Joint manipulation, Spinal manipulation, Spinal mobilization, Scar mobilization, Vestibular training, DME instructions, Cryotherapy, and Moist heat  PLAN FOR  NEXT SESSION:   Slow progressive loading program   1:16 PM, 10/08/23   Massie FORBES Dollar, PT 10/08/2023,  1:16 PM

## 2023-10-13 ENCOUNTER — Ambulatory Visit

## 2023-10-13 DIAGNOSIS — R262 Difficulty in walking, not elsewhere classified: Secondary | ICD-10-CM

## 2023-10-13 DIAGNOSIS — M6281 Muscle weakness (generalized): Secondary | ICD-10-CM | POA: Diagnosis not present

## 2023-10-13 DIAGNOSIS — R269 Unspecified abnormalities of gait and mobility: Secondary | ICD-10-CM

## 2023-10-13 DIAGNOSIS — R2681 Unsteadiness on feet: Secondary | ICD-10-CM

## 2023-10-13 DIAGNOSIS — Z96642 Presence of left artificial hip joint: Secondary | ICD-10-CM

## 2023-10-13 NOTE — Therapy (Signed)
 OUTPATIENT PHYSICAL THERAPY TREATMENT   Patient Name: Todd Farley MRN: [id number] DOB:Jun 16, 1945, 78 y.o., male Today's Date: 10/13/2023  END OF SESSION:  PT End of Session - 10/13/23 1310     Visit Number 6    Number of Visits 24    Date for PT Re-Evaluation 11/05/23    Authorization Type Medicare A&B; AARP secondary    Progress Note Due on Visit 10    PT Start Time 1315    PT Stop Time 1357    PT Time Calculation (min) 42 min    Equipment Utilized During Treatment Gait belt    Activity Tolerance Patient tolerated treatment well;No increased pain    Behavior During Therapy WFL for tasks assessed/performed            Past Medical History:  Diagnosis Date   Actinic keratosis    Anemia    Arthritis    Bronchial stenosis, right    Cancer (HCC)    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   Chronic bronchitis (HCC)    Diabetes mellitus without complication (HCC)    GERD (gastroesophageal reflux disease)    High cholesterol    History of kidney stones    Hypertension    Neuropathy    Pneumonia    Umbilical hernia    Past Surgical History:  Procedure Laterality Date   BRONCHIAL WASHINGS N/A 01/16/2023   Procedure: BRONCHIAL WASHINGS;  Surgeon: Parris Manna, MD;  Location: [facility] ORS;  Service: Thoracic;  Laterality: N/A;   CARPAL TUNNEL RELEASE Right    COLONOSCOPY WITH PROPOFOL  N/A 06/25/2015   Procedure: COLONOSCOPY WITH PROPOFOL ;  Surgeon: Lamar ONEIDA Holmes, MD;  Location: [facility] ENDOSCOPY;  Service: Endoscopy;  Laterality: N/A;   ESOPHAGOGASTRODUODENOSCOPY (EGD) WITH PROPOFOL  N/A 06/25/2015   Procedure: ESOPHAGOGASTRODUODENOSCOPY (EGD) WITH PROPOFOL ;  Surgeon: Lamar ONEIDA Holmes, MD;  Location: [facility] ENDOSCOPY;  Service: Endoscopy;  Laterality: N/A;   EYE SURGERY     eyelids x2   EYE SURGERY     cataract x 2 - intraocular implants   FLEXIBLE BRONCHOSCOPY N/A 01/16/2023   Procedure: FLEXIBLE BRONCHOSCOPY;  Surgeon: Parris Manna, MD;  Location: [facility] ORS;  Service: Thoracic;   Laterality: N/A;   HERNIA REPAIR     inguinal   JOINT REPLACEMENT Right    knee   KNEE ARTHROSCOPY Right    acl repair   TOTAL HIP ARTHROPLASTY Left 09/07/2023   Procedure: ARTHROPLASTY, HIP, TOTAL, ANTERIOR APPROACH;  Surgeon: Lorelle Hussar, MD;  Location: [facility] ORS;  Service: Orthopedics;  Laterality: Left;  spinal with sedation   Patient Active Problem List   Diagnosis Date Noted   S/P total left hip arthroplasty 09/07/2023   Umbilical hernia without obstruction and without gangrene 01/26/2018   Diabetic polyneuropathy associated with type 2 diabetes mellitus (HCC) 10/13/2017   GERD (gastroesophageal reflux disease) 04/13/2015   Diabetes mellitus type 2, uncomplicated (HCC) 03/14/2014   Hypertension 03/14/2014   PCP: Dr. Norleen Rower  REFERRING PROVIDER: Lonni Amber, PA-C  REFERRING DIAG: S/P Left Total Hip Arthorplasty  THERAPY DIAG:  Muscle weakness (generalized)  Difficulty in walking, not elsewhere classified  Abnormality of gait and mobility  Unsteadiness on feet  Status post total hip replacement, left  Rationale for Evaluation and Treatment: Rehabilitation  ONSET DATE: surgery on 09/07/23  SUBJECTIVE:   SUBJECTIVE STATEMENT: Pt reports that his L hip is feeling good, but continues to have more pain in his R hip. Pt is awaiting to hear from pulmonologist or thoracic surgeon regarding bronchial trunk  surgery. Pt reports that his R hip is starting to feel like his L hip did prior to the replacement.   PERTINENT HISTORY: S/p Left THA (Anterior approach) by Dr. Lorelle on 09/07/2023- due to OA with pain worsening. Pt also has some bronchial trunk scarring which may require surgery once he recovers from his hip surgery.  PAIN:  Are you having pain? <1/10 Left hip pain   PRECAUTIONS: No precautions  WEIGHT BEARING RESTRICTIONS: WBAT  FALLS:  Has patient fallen in last 6 months? No  LIVING ENVIRONMENT: Lives with: lives with their spouse Lives in:  House/apartment Stairs: Yes: Internal: 15 steps; on left going up and External: 3 steps; on right going up Has following equipment at home: Single point cane, Walker - 2 wheeled, shower chair, and bed side commode  OCCUPATION: Retired  PLOF: Independent  PATIENT GOALS: Recover- improve my strength and balance  NEXT MD VISIT: Dr. Lorelle- End of July  OBJECTIVE:  Note: Objective measures were completed at evaluation unless otherwise noted.  PATIENT SURVEYS:  LEFS eval: 37  LOWER EXTREMITY MMT:  MMT Right eval Left eval  Hip flexion 5 4  Hip extension 5 4  Hip abduction 5 4  Hip adduction 5 4  Hip internal rotation 5 NT  Hip external rotation 5 NT  Knee flexion 5 4  Knee extension 5 4  Ankle dorsiflexion 5 4     FUNCTIONAL TESTS:  5 times sit to stand: 21.82 sec without UE support Timed up and go (TUG): 16.82 sec with use of SPC 6 minute walk test: 925ft AMB in 50m6s on 09/28/23 )(SPC, 1 LOB)  10 meter walk test: 0.60m/s on 09/28/23 Berg Balance Scale: 45/56 at eval                                                                                                                               TREATMENT DATE 10/13/23   Sit<>stand from arm chair x10 no UE support  Standing in // bars: - RTB squats 2x10- cues to maintain tension on RTB to prevent knee valgus with squat - Forward step up on 6 step 2x10 - Lateral step up/over on 6 step 2x10 - Hip hike with one foot on 6 step x10 each LE - RTB resisted side-step x10 length of // bars both directions - RTB straight leg abduction 2x15 each LE - RTB glute kick back 2x15 each LE- cues for upright posture to isolate glute activation  Seated: - RTB abduction 2x15 - Adduction squeeze with rainbow ball and 3 second isometric 2x20 - Adduction squeeze with lateral toe tap 2x10 each LE - Toe taps over orange hurdle 2x10 each LE  PATIENT EDUCATION:  Education details: Purpose of PT: Review of hip replacement and any  precautions; Review of HEP from HHPT.  Person educated: Patient Education method: Explanation, Demonstration, Tactile cues, and Verbal cues Education comprehension: verbalized understanding, returned demonstration, verbal cues required, tactile cues required,  and needs further education  HOME EXERCISE PROGRAM: Access Code: [facility] URL: [email or web address] Date: 09/28/2023 Prepared by: Peggye Linear  Exercises - Sit to Stand with Arms Crossed  - 1 x daily - 2 sets - 10 reps - Supine Bridge  - 1 x daily - 2 sets - 10 reps - Side Stepping with Counter Support  - 1 x daily - 2 sets - 10 reps - Side Stepping with Counter Support  - 1 x daily - 2 sets - 20 reps - Seated Long Arc Quad  - 1 x daily - 2 sets - 15 reps - 5 hold - Heel Raises with Counter Support  - 1 x daily - 3 sets - 15 reps - Standing March with Counter Support  - 1 x daily - 2 sets - 20 reps   ASSESSMENT:  CLINICAL IMPRESSION: Pt tolerated session well with no reported increase in hip pain throughout. Pt demonstrated instances of poor foot clearance when challenged with stepping onto higher step today, but is an area of improvement for future sessions. Pt remained motivated to participate throughout and to improve his mobility level. Pt will benefit from PT services to address deficits in strength, mobility, and pain in order to return to full function at home with less knee pain.  OBJECTIVE IMPAIRMENTS: Abnormal gait, decreased activity tolerance, decreased balance, decreased coordination, decreased endurance, decreased mobility, difficulty walking, decreased strength, and pain.  ACTIVITY LIMITATIONS: carrying, lifting, bending, sitting, standing, squatting, and stairs PARTICIPATION LIMITATIONS: meal prep, cleaning, laundry, shopping, community activity, and yard work PERSONAL FACTORS: 1-2 comorbidities: arthritis and bronchial issues are also affecting patient's functional outcome.  REHAB POTENTIAL:  Good CLINICAL DECISION MAKING: Stable/uncomplicated EVALUATION COMPLEXITY: Low  GOALS: Goals reviewed with patient? Yes  SHORT TERM GOALS: Target date: 11/05/2023 Pt will be independent with HEP in order to decrease ankle pain and increase strength in order to improve pain-free function at home.  Baseline: EVAL- Will need updated HEP as he progresses and currently performing supine and standing from HHPT.  Goal status: INITIAL  LONG TERM GOALS: Target date: 12/17/2023  Pt will increase LEFS by at least 9 points in order to demonstrate significant improvement in lower extremity function.   Baseline: EVAL= 37/80 Goal status: INITIAL  2.  Pt will decrease worst pain in left hip as reported on NPRS by to 1/10 or no pain in order to demonstrate clinically significant reduction in ankle/foot pain. Baseline: EVAL= 1-2/10 Goal status: INITIAL  3.  Patient will improve his SLS balance by 5 sec on Left LE for improved hip stability with standing/walking.  Baseline: EVAL - 1-2 sec hold only on LLE Goal status: INITIAL  4.  Pt will improve BERG by at least 3 points in order to demonstrate clinically significant improvement in balance. Baseline: EVAL= 46/56 Goal status: INITIAL  5.  Pt will decrease 5TSTS by at least 7 seconds in order to demonstrate clinically significant improvement in LE strength. Baseline: EVAL= 21.82 sec without UE support Goal status: INITIAL  6.  Pt will decrease TUG to below 14 seconds/decrease in order to demonstrate decreased fall risk. Baseline: EVAL= Pt will decrease TUG to below 14 seconds/decrease in order to demonstrate decreased fall risk. Goal status: INITIAL  PLAN:  PT FREQUENCY: 1-2x/week  PT DURATION: 12 weeks  PLANNED INTERVENTIONS: 97164- PT Re-evaluation, 97110-Therapeutic exercises, 97530- Therapeutic activity, [id number]- Neuromuscular re-education, 97535- Self Care, 02859- Manual therapy, [id number]- Gait training, [id number]- Orthotic Initial, [id number]-  Orthotic/Prosthetic subsequent, [phone]- Canalith repositioning, [phone]- Electrical  stimulation (manual), 02964- Ultrasound, 20560 (1-2 muscles), 20561 (3+ muscles)- Dry Needling, Patient/Family education, Balance training, Stair training, Taping, Joint mobilization, Joint manipulation, Spinal manipulation, Spinal mobilization, Scar mobilization, Vestibular training, DME instructions, Cryotherapy, and Moist heat  PLAN FOR NEXT SESSION:   Slow progressive loading program   Quetzally Callas, SPT  This entire session was performed under direct supervision and direction of a licensed therapist/therapist assistant . I have personally read, edited and approve of the note as written.  Marina  Leopoldo, PT, DPT Physical Therapist - [facility]  Outpatient Physical Therapy- Main Campus [phone]     4:14 PM, 10/13/23   Marina  Moser, PT 10/13/2023, 4:14 PM

## 2023-10-15 ENCOUNTER — Ambulatory Visit: Admitting: Physical Therapy

## 2023-10-15 DIAGNOSIS — R269 Unspecified abnormalities of gait and mobility: Secondary | ICD-10-CM

## 2023-10-15 DIAGNOSIS — Z96642 Presence of left artificial hip joint: Secondary | ICD-10-CM

## 2023-10-15 DIAGNOSIS — M6281 Muscle weakness (generalized): Secondary | ICD-10-CM | POA: Diagnosis not present

## 2023-10-15 DIAGNOSIS — R262 Difficulty in walking, not elsewhere classified: Secondary | ICD-10-CM

## 2023-10-15 DIAGNOSIS — R2681 Unsteadiness on feet: Secondary | ICD-10-CM

## 2023-10-15 NOTE — Therapy (Unsigned)
 OUTPATIENT PHYSICAL THERAPY TREATMENT   Patient Name: Todd Farley MRN: [id number] DOB:1945-12-15, 78 y.o., male Today's Date: 10/15/2023  END OF SESSION:  PT End of Session - 10/15/23 1618     Visit Number 7    Number of Visits 24    Date for PT Re-Evaluation 11/05/23    Authorization Type Medicare A&B; [employer] secondary    Progress Note Due on Visit 10    PT Start Time 1616    PT Stop Time 1657    PT Time Calculation (min) 41 min    Equipment Utilized During Treatment Gait belt    Activity Tolerance Patient tolerated treatment well;No increased pain    Behavior During Therapy WFL for tasks assessed/performed             Past Medical History:  Diagnosis Date   Actinic keratosis    Anemia    Arthritis    Bronchial stenosis, right    Cancer (HCC)    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   Chronic bronchitis (HCC)    Diabetes mellitus without complication (HCC)    GERD (gastroesophageal reflux disease)    High cholesterol    History of kidney stones    Hypertension    Neuropathy    Pneumonia    Umbilical hernia    Past Surgical History:  Procedure Laterality Date   BRONCHIAL WASHINGS N/A 01/16/2023   Procedure: BRONCHIAL WASHINGS;  Surgeon: Parris Manna, MD;  Location: [facility] ORS;  Service: Thoracic;  Laterality: N/A;   CARPAL TUNNEL RELEASE Right    COLONOSCOPY WITH PROPOFOL  N/A 06/25/2015   Procedure: COLONOSCOPY WITH PROPOFOL ;  Surgeon: Lamar ONEIDA Holmes, MD;  Location: [facility] ENDOSCOPY;  Service: Endoscopy;  Laterality: N/A;   ESOPHAGOGASTRODUODENOSCOPY (EGD) WITH PROPOFOL  N/A 06/25/2015   Procedure: ESOPHAGOGASTRODUODENOSCOPY (EGD) WITH PROPOFOL ;  Surgeon: Lamar ONEIDA Holmes, MD;  Location: [facility] ENDOSCOPY;  Service: Endoscopy;  Laterality: N/A;   EYE SURGERY     eyelids x2   EYE SURGERY     cataract x 2 - intraocular implants   FLEXIBLE BRONCHOSCOPY N/A 01/16/2023   Procedure: FLEXIBLE BRONCHOSCOPY;  Surgeon: Parris Manna, MD;  Location: [facility] ORS;  Service: Thoracic;   Laterality: N/A;   HERNIA REPAIR     inguinal   JOINT REPLACEMENT Right    knee   KNEE ARTHROSCOPY Right    acl repair   TOTAL HIP ARTHROPLASTY Left 09/07/2023   Procedure: ARTHROPLASTY, HIP, TOTAL, ANTERIOR APPROACH;  Surgeon: Lorelle Hussar, MD;  Location: [facility] ORS;  Service: Orthopedics;  Laterality: Left;  spinal with sedation   Patient Active Problem List   Diagnosis Date Noted   S/P total left hip arthroplasty 09/07/2023   Umbilical hernia without obstruction and without gangrene 01/26/2018   Diabetic polyneuropathy associated with type 2 diabetes mellitus (HCC) 10/13/2017   GERD (gastroesophageal reflux disease) 04/13/2015   Diabetes mellitus type 2, uncomplicated (HCC) 03/14/2014   Hypertension 03/14/2014   PCP: Dr. Norleen Rower  REFERRING PROVIDER: Lonni Amber, PA-C  REFERRING DIAG: S/P Left Total Hip Arthorplasty  THERAPY DIAG:  Muscle weakness (generalized)  Difficulty in walking, not elsewhere classified  Abnormality of gait and mobility  Unsteadiness on feet  Status post total hip replacement, left  Rationale for Evaluation and Treatment: Rehabilitation  ONSET DATE: surgery on 09/07/23  SUBJECTIVE:   SUBJECTIVE STATEMENT: Pt reports that his L hip is feeling good, but continues to have more pain in his R hip. Pt is awaiting to hear from pulmonologist or thoracic surgeon regarding bronchial  trunk surgery. Pt reports that his R hip is starting to feel like his L hip did prior to the replacement.   PERTINENT HISTORY: S/p Left THA (Anterior approach) by Dr. Lorelle on 09/07/2023- due to OA with pain worsening. Pt also has some bronchial trunk scarring which may require surgery once he recovers from his hip surgery.  PAIN:  Are you having pain? <1/10 Left hip pain   PRECAUTIONS: No precautions  WEIGHT BEARING RESTRICTIONS: WBAT  FALLS:  Has patient fallen in last 6 months? No  LIVING ENVIRONMENT: Lives with: lives with their spouse Lives in:  House/apartment Stairs: Yes: Internal: 15 steps; on left going up and External: 3 steps; on right going up Has following equipment at home: Single point cane, Walker - 2 wheeled, shower chair, and bed side commode  OCCUPATION: Retired  PLOF: Independent  PATIENT GOALS: Recover- improve my strength and balance  NEXT MD VISIT: Dr. Lorelle- End of July  OBJECTIVE:  Note: Objective measures were completed at evaluation unless otherwise noted.  PATIENT SURVEYS:  LEFS eval: 37  LOWER EXTREMITY MMT:  MMT Right eval Left eval  Hip flexion 5 4  Hip extension 5 4  Hip abduction 5 4  Hip adduction 5 4  Hip internal rotation 5 NT  Hip external rotation 5 NT  Knee flexion 5 4  Knee extension 5 4  Ankle dorsiflexion 5 4     FUNCTIONAL TESTS:  5 times sit to stand: 21.82 sec without UE support Timed up and go (TUG): 16.82 sec with use of SPC 6 minute walk test: 959ft AMB in 51m6s on 09/28/23 )(SPC, 1 LOB)  10 meter walk test: 0.38m/s on 09/28/23 Berg Balance Scale: 45/56 at eval                                                                                                                               TREATMENT DATE 10/15/23   Sit<>stand from arm chair x10 no UE support  Standing in // bars: - RTB sidestep then wide base squat with TB around ankles 2x10- - RTB straight leg abduction 2x15 each LE - RTB glute kick back 2x10 each LE- cues for upright posture to isolate glute - Forward step up on 6 step 2x15 - Lateral step up/over on 6 step 2x15 Forward march with 3# AW 2 x 3 laps in // bars, cues for large hip flexion movement    Seated: LAQ x 15 ea with 3# AW  Seated step over hurdle with 3# AW x 15 LLE only    PATIENT EDUCATION:  Education details: Purpose of PT: Review of hip replacement and any precautions; Review of HEP from HHPT.  Person educated: Patient Education method: Explanation, Demonstration, Tactile cues, and Verbal cues Education comprehension: verbalized  understanding, returned demonstration, verbal cues required, tactile cues required, and needs further education  HOME EXERCISE PROGRAM: Access Code: [facility] URL: [email or web address] Date: 09/28/2023 Prepared by: Peggye Linear  Exercises - Sit to Stand with Arms Crossed  - 1 x daily - 2 sets - 10 reps - Supine Bridge  - 1 x daily - 2 sets - 10 reps - Side Stepping with Counter Support  - 1 x daily - 2 sets - 10 reps - Side Stepping with Counter Support  - 1 x daily - 2 sets - 20 reps - Seated Long Arc Quad  - 1 x daily - 2 sets - 15 reps - 5 hold - Heel Raises with Counter Support  - 1 x daily - 3 sets - 15 reps - Standing March with Counter Support  - 1 x daily - 2 sets - 20 reps   ASSESSMENT:  CLINICAL IMPRESSION: Pt tolerated session well with no reported increase in hip pain throughout. Pt demonstrated instances of poor foot clearance when challenged with stepping onto higher step today, but is an area of improvement for future sessions. Pt remained motivated to participate throughout and to improve his mobility level. Pt will benefit from PT services to address deficits in strength, mobility, and pain in order to return to full function at home with less knee pain.  OBJECTIVE IMPAIRMENTS: Abnormal gait, decreased activity tolerance, decreased balance, decreased coordination, decreased endurance, decreased mobility, difficulty walking, decreased strength, and pain.  ACTIVITY LIMITATIONS: carrying, lifting, bending, sitting, standing, squatting, and stairs PARTICIPATION LIMITATIONS: meal prep, cleaning, laundry, shopping, community activity, and yard work PERSONAL FACTORS: 1-2 comorbidities: arthritis and bronchial issues are also affecting patient's functional outcome.  REHAB POTENTIAL: Good CLINICAL DECISION MAKING: Stable/uncomplicated EVALUATION COMPLEXITY: Low  GOALS: Goals reviewed with patient? Yes  SHORT TERM GOALS: Target date: 11/05/2023 Pt will be  independent with HEP in order to decrease ankle pain and increase strength in order to improve pain-free function at home.  Baseline: EVAL- Will need updated HEP as he progresses and currently performing supine and standing from HHPT.  Goal status: INITIAL  LONG TERM GOALS: Target date: 12/17/2023  Pt will increase LEFS by at least 9 points in order to demonstrate significant improvement in lower extremity function.   Baseline: EVAL= 37/80 Goal status: INITIAL  2.  Pt will decrease worst pain in left hip as reported on NPRS by to 1/10 or no pain in order to demonstrate clinically significant reduction in ankle/foot pain. Baseline: EVAL= 1-2/10 Goal status: INITIAL  3.  Patient will improve his SLS balance by 5 sec on Left LE for improved hip stability with standing/walking.  Baseline: EVAL - 1-2 sec hold only on LLE Goal status: INITIAL  4.  Pt will improve BERG by at least 3 points in order to demonstrate clinically significant improvement in balance. Baseline: EVAL= 46/56 Goal status: INITIAL  5.  Pt will decrease 5TSTS by at least 7 seconds in order to demonstrate clinically significant improvement in LE strength. Baseline: EVAL= 21.82 sec without UE support Goal status: INITIAL  6.  Pt will decrease TUG to below 14 seconds/decrease in order to demonstrate decreased fall risk. Baseline: EVAL= Pt will decrease TUG to below 14 seconds/decrease in order to demonstrate decreased fall risk. Goal status: INITIAL  PLAN:  PT FREQUENCY: 1-2x/week  PT DURATION: 12 weeks  PLANNED INTERVENTIONS: 97164- PT Re-evaluation, 97110-Therapeutic exercises, 97530- Therapeutic activity, [id number]- Neuromuscular re-education, 97535- Self Care, 02859- Manual therapy, [id number]- Gait training, [id number]- Orthotic Initial, [id number]- Orthotic/Prosthetic subsequent, [id number]- Canalith repositioning, [id number]- Electrical stimulation (manual), [id number]- Ultrasound, 79439 (1-2 muscles), 20561 (3+ muscles)- Dry Needling,  Patient/Family education, Balance training, Stair training, Taping, Joint  mobilization, Joint manipulation, Spinal manipulation, Spinal mobilization, Scar mobilization, Vestibular training, DME instructions, Cryotherapy, and Moist heat  PLAN FOR NEXT SESSION:   Slow progressive loading program    4:18 PM, 10/15/23   Lonni KATHEE Gainer, PT 10/15/2023, 4:18 PM

## 2023-10-20 ENCOUNTER — Other Ambulatory Visit: Payer: Self-pay

## 2023-10-20 ENCOUNTER — Ambulatory Visit

## 2023-10-20 DIAGNOSIS — M6281 Muscle weakness (generalized): Secondary | ICD-10-CM

## 2023-10-20 DIAGNOSIS — R269 Unspecified abnormalities of gait and mobility: Secondary | ICD-10-CM

## 2023-10-20 DIAGNOSIS — R918 Other nonspecific abnormal finding of lung field: Secondary | ICD-10-CM

## 2023-10-20 DIAGNOSIS — R2681 Unsteadiness on feet: Secondary | ICD-10-CM

## 2023-10-20 DIAGNOSIS — Z96642 Presence of left artificial hip joint: Secondary | ICD-10-CM

## 2023-10-20 DIAGNOSIS — R262 Difficulty in walking, not elsewhere classified: Secondary | ICD-10-CM

## 2023-10-20 NOTE — Therapy (Signed)
 OUTPATIENT PHYSICAL THERAPY TREATMENT: 4 WEEK REASSESSMENT   Patient Name: Todd Farley MRN: [id number] DOB:1946/02/05, 78 y.o., male Today's Date: 10/20/2023  END OF SESSION:  PT End of Session - 10/20/23 1625     Visit Number 8    Number of Visits 24    Date for PT Re-Evaluation 11/05/23    Authorization Type Medicare A&B; [employer] secondary    Progress Note Due on Visit 10    PT Start Time 1615    PT Stop Time 1655    PT Time Calculation (min) 40 min    Equipment Utilized During Treatment Gait belt    Activity Tolerance Patient tolerated treatment well;No increased pain    Behavior During Therapy WFL for tasks assessed/performed             Past Medical History:  Diagnosis Date   Actinic keratosis    Anemia    Arthritis    Bronchial stenosis, right    Cancer (HCC)    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   Chronic bronchitis (HCC)    Diabetes mellitus without complication (HCC)    GERD (gastroesophageal reflux disease)    High cholesterol    History of kidney stones    Hypertension    Neuropathy    Pneumonia    Umbilical hernia    Past Surgical History:  Procedure Laterality Date   BRONCHIAL WASHINGS N/A 01/16/2023   Procedure: BRONCHIAL WASHINGS;  Surgeon: Parris Manna, MD;  Location: [facility] ORS;  Service: Thoracic;  Laterality: N/A;   CARPAL TUNNEL RELEASE Right    COLONOSCOPY WITH PROPOFOL  N/A 06/25/2015   Procedure: COLONOSCOPY WITH PROPOFOL ;  Surgeon: Lamar ONEIDA Holmes, MD;  Location: [facility] ENDOSCOPY;  Service: Endoscopy;  Laterality: N/A;   ESOPHAGOGASTRODUODENOSCOPY (EGD) WITH PROPOFOL  N/A 06/25/2015   Procedure: ESOPHAGOGASTRODUODENOSCOPY (EGD) WITH PROPOFOL ;  Surgeon: Lamar ONEIDA Holmes, MD;  Location: [facility] ENDOSCOPY;  Service: Endoscopy;  Laterality: N/A;   EYE SURGERY     eyelids x2   EYE SURGERY     cataract x 2 - intraocular implants   FLEXIBLE BRONCHOSCOPY N/A 01/16/2023   Procedure: FLEXIBLE BRONCHOSCOPY;  Surgeon: Parris Manna, MD;  Location: [facility] ORS;   Service: Thoracic;  Laterality: N/A;   HERNIA REPAIR     inguinal   JOINT REPLACEMENT Right    knee   KNEE ARTHROSCOPY Right    acl repair   TOTAL HIP ARTHROPLASTY Left 09/07/2023   Procedure: ARTHROPLASTY, HIP, TOTAL, ANTERIOR APPROACH;  Surgeon: Lorelle Hussar, MD;  Location: [facility] ORS;  Service: Orthopedics;  Laterality: Left;  spinal with sedation   Patient Active Problem List   Diagnosis Date Noted   S/P total left hip arthroplasty 09/07/2023   Umbilical hernia without obstruction and without gangrene 01/26/2018   Diabetic polyneuropathy associated with type 2 diabetes mellitus (HCC) 10/13/2017   GERD (gastroesophageal reflux disease) 04/13/2015   Diabetes mellitus type 2, uncomplicated (HCC) 03/14/2014   Hypertension 03/14/2014   PCP: Dr. Norleen Rower  REFERRING PROVIDER: Lonni Amber, PA-C  REFERRING DIAG: S/P Left Total Hip Arthorplasty  THERAPY DIAG:  Muscle weakness (generalized)  Difficulty in walking, not elsewhere classified  Abnormality of gait and mobility  Unsteadiness on feet  Status post total hip replacement, left  Rationale for Evaluation and Treatment: Rehabilitation  ONSET DATE: surgery on 09/07/23  SUBJECTIVE:   SUBJECTIVE STATEMENT: Pt reports that his L hip is feeling good, but continues to have more pain in his R hip.   PERTINENT HISTORY: S/p Left THA (Anterior approach)  by Dr. Lorelle on 09/07/2023- due to OA with pain worsening. Pt also has some bronchial trunk scarring which may require surgery once he recovers from his hip surgery.  PAIN:  Are you having pain? <1/10 Left hip pain   PRECAUTIONS: No precautions  WEIGHT BEARING RESTRICTIONS: WBAT  FALLS:  Has patient fallen in last 6 months? No  LIVING ENVIRONMENT: Lives with: lives with their spouse Lives in: House/apartment Stairs: Yes: Internal: 15 steps; on left going up and External: 3 steps; on right going up Has following equipment at home: Single point cane, Walker - 2  wheeled, shower chair, and bed side commode  OCCUPATION: Retired  PLOF: Independent  PATIENT GOALS: Recover- improve my strength and balance  NEXT MD VISIT: Dr. Lorelle- End of July  OBJECTIVE:  Note: Objective measures were completed at evaluation unless otherwise noted.  PATIENT SURVEYS:  LEFS eval: 37  LOWER EXTREMITY MMT:  MMT Right eval Left eval  Hip flexion 5 4  Hip extension 5 4  Hip abduction 5 4  Hip adduction 5 4  Hip internal rotation 5 NT  Hip external rotation 5 NT  Knee flexion 5 4  Knee extension 5 4  Ankle dorsiflexion 5 4    FUNCTIONAL TESTS:  5 times sit to stand: 21.82 sec without UE support Timed up and go (TUG): 16.82 sec with use of SPC 6 minute walk test: 985ft AMB in 54m6s on 09/28/23 )(SPC, 1 LOB)  10 meter walk test: 0.62m/s on 09/28/23 Berg Balance Scale: 45/56 at eval                                                                                                                              TREATMENT DATE 10/20/23  -42ft overground AMB c SPC, modI level, pain free, no LOB  -LEFS survey -5xSTS  -TUG -SLS assessent (unchanged)  -HEP review -Discussion of LT treatments, pt's personal goals  PATIENT EDUCATION:  Education details: Purpose of PT: Review retest findings  Person educated: Patient Education method: Explanation, Demonstration, Tactile cues, and Verbal cues Education comprehension: verbalized understanding, returned demonstration, verbal cues required, tactile cues required, and needs further education  HOME EXERCISE PROGRAM: Access Code: [facility] URL: [email or web address] Date: 09/28/2023 Prepared by: Peggye Linear  Exercises - Sit to Stand with Arms Crossed  - 1 x daily - 2 sets - 10 reps - Supine Bridge  - 1 x daily - 2 sets - 10 reps - Side Stepping with Counter Support  - 1 x daily - 2 sets - 10 reps - Side Stepping with Counter Support  - 1 x daily - 2 sets - 20 reps - Seated Long Arc Quad  - 1 x  daily - 2 sets - 15 reps - 5 hold - Heel Raises with Counter Support  - 1 x daily - 3 sets - 15 reps - Standing March with Counter Support  - 1 x daily - 2 sets - 20  reps Recommended on 10/20/23 to begin practice on SLS at home   ASSESSMENT:  CLINICAL IMPRESSION: Pt presents for PT now 4 weeks post eval, time is taken to reassess tremendous progress toward LT goals of care. Assessment revealing of dramatic improvement in LEFS, 5xSTS, TUG, unchanged performance of SLS. Pt is AMB at a level far better than he had expected he would, reportedly this surgeyr has taken him back 5 years in terms of functional age. Pt feels adeuately prepared to for DC. We make a plan for take 3 weeks off to allow for transition back to self-directed activity/mobility, and upon return to will evaluate for any remaining areas of need and likely DC at that time.    OBJECTIVE IMPAIRMENTS: Abnormal gait, decreased activity tolerance, decreased balance, decreased coordination, decreased endurance, decreased mobility, difficulty walking, decreased strength, and pain.  ACTIVITY LIMITATIONS: carrying, lifting, bending, sitting, standing, squatting, and stairs PARTICIPATION LIMITATIONS: meal prep, cleaning, laundry, shopping, community activity, and yard work PERSONAL FACTORS: 1-2 comorbidities: arthritis and bronchial issues are also affecting patient's functional outcome.  REHAB POTENTIAL: Good CLINICAL DECISION MAKING: Stable/uncomplicated EVALUATION COMPLEXITY: Low  GOALS: Goals reviewed with patient? Yes  SHORT TERM GOALS: Target date: 11/05/2023 Pt will be independent with HEP in order to decrease ankle pain and increase strength in order to improve pain-free function at home.  Baseline: EVAL- Will need updated HEP as he progresses and currently performing supine and standing from HHPT.  Goal status: INITIAL  LONG TERM GOALS: Target date: 12/17/2023  Pt will increase LEFS by at least 9 points in order to demonstrate  significant improvement in lower extremity function.   Baseline: EVAL= 37/80; 83.8% LEFS 10/20/23 Goal status: MET   2.  Pt will decrease worst pain in left hip as reported on NPRS by to 1/10 or no pain in order to demonstrate clinically significant reduction in ankle/foot pain. Baseline: EVAL= 1-2/10; 10/20/23: <1/10  Goal status: MET   3.  Patient will improve his SLS balance by 5 sec on Left LE for improved hip stability with standing/walking.  Baseline: EVAL - 1-2 sec hold only on LLE; 7/22 unchanged (limited by severe ankle/foot postural changes) Goal status: NOT MET   4.  Pt will improve BERG by at least 3 points in order to demonstrate clinically significant improvement in balance. Baseline: EVAL= 46/56; 10/20/23 (did not reassess due to time)  Goal status: INITIAL  5.  Pt will decrease 5TSTS by at least 7 seconds in order to demonstrate clinically significant improvement in LE strength. Baseline: EVAL= 21.82 sec without UE support; 10/20/23: 11.33sec hands free  Goal status: MET  6.  Pt will decrease TUG to below 14 seconds/decrease in order to demonstrate decreased fall risk. Baseline: EVAL= Pt will decrease TUG to below 14 seconds/decrease in order to demonstrate decreased fall risk; 13.12 sec  Goal status: MET  PLAN:  PT FREQUENCY: 1-2x/week  PT DURATION: 12 weeks  PLANNED INTERVENTIONS: 97164- PT Re-evaluation, 97110-Therapeutic exercises, 97530- Therapeutic activity, 97112- Neuromuscular re-education, 97535- Self Care, 02859- Manual therapy, [id number]- Gait training, [id number]- Orthotic Initial, [id number]- Orthotic/Prosthetic subsequent, [phone]- Canalith repositioning, [id number]- Electrical stimulation (manual), [id number]- Ultrasound, 79439 (1-2 muscles), 20561 (3+ muscles)- Dry Needling, Patient/Family education, Balance training, Stair training, Taping, Joint mobilization, Joint manipulation, Spinal manipulation, Spinal mobilization, Scar mobilization, Vestibular training, DME instructions,  Cryotherapy, and Moist heat  PLAN FOR NEXT SESSION:  3 week follow up and if all goes well, consider DC at that time.   4:26 PM, 10/20/23  Malyssa Maris C, PT 10/20/2023, 4:26 PM   5:01 PM, 10/20/23 Peggye JAYSON Linear, PT, DPT Physical Therapist - [facility]  [phone] [facility])

## 2023-10-26 ENCOUNTER — Ambulatory Visit (HOSPITAL_COMMUNITY)
Admission: RE | Admit: 2023-10-26 | Discharge: 2023-10-26 | Disposition: A | Discharge status: Home / Self Care | Source: Ambulatory Visit | Attending: Thoracic Surgery (Cardiothoracic Vascular Surgery) | Admitting: Thoracic Surgery (Cardiothoracic Vascular Surgery)

## 2023-10-26 DIAGNOSIS — R918 Other nonspecific abnormal finding of lung field: Secondary | ICD-10-CM | POA: Insufficient documentation

## 2023-10-26 DIAGNOSIS — K760 Fatty (change of) liver, not elsewhere classified: Secondary | ICD-10-CM | POA: Insufficient documentation

## 2023-10-26 DIAGNOSIS — I700 Atherosclerosis of aorta: Secondary | ICD-10-CM | POA: Insufficient documentation

## 2023-10-26 LAB — POCT I-STAT CREATININE: Creatinine, Ser: 0.9 mg/dL (ref 0.61–1.24)

## 2023-10-26 MED ORDER — IOHEXOL 350 MG/ML SOLN
50.0000 mL | Freq: Once | INTRAVENOUS | Status: AC | PRN
  Administered 2023-10-26: 50 mL via INTRAVENOUS

## 2023-10-27 ENCOUNTER — Ambulatory Visit

## 2023-11-04 ENCOUNTER — Encounter: Admitting: Physical Therapy

## 2023-11-09 ENCOUNTER — Encounter: Admitting: Physical Therapy

## 2023-11-11 ENCOUNTER — Ambulatory Visit: Attending: Orthopedic Surgery | Admitting: Physical Therapy

## 2023-11-11 DIAGNOSIS — R2681 Unsteadiness on feet: Secondary | ICD-10-CM | POA: Insufficient documentation

## 2023-11-11 DIAGNOSIS — M6281 Muscle weakness (generalized): Secondary | ICD-10-CM | POA: Insufficient documentation

## 2023-11-11 DIAGNOSIS — Z96642 Presence of left artificial hip joint: Secondary | ICD-10-CM | POA: Diagnosis present

## 2023-11-11 DIAGNOSIS — R269 Unspecified abnormalities of gait and mobility: Secondary | ICD-10-CM | POA: Diagnosis present

## 2023-11-11 DIAGNOSIS — R262 Difficulty in walking, not elsewhere classified: Secondary | ICD-10-CM | POA: Diagnosis present

## 2023-11-11 NOTE — Therapy (Signed)
 OUTPATIENT PHYSICAL THERAPY TREATMENT Re-certification Discharge Summary.    Patient Name: Todd Farley MRN: [id number] DOB:04/13/1945, 78 y.o., male Today's Date: 11/11/2023  END OF SESSION:  PT End of Session - 11/11/23 1534     Visit Number 9    Number of Visits 24    Date for PT Re-Evaluation 11/11/23    Authorization Type Medicare A&B; AARP secondary    Progress Note Due on Visit 10    PT Start Time 1533    PT Stop Time 1611    PT Time Calculation (min) 38 min    Equipment Utilized During Treatment Gait belt    Activity Tolerance Patient tolerated treatment well;No increased pain    Behavior During Therapy WFL for tasks assessed/performed             Past Medical History:  Diagnosis Date   Actinic keratosis    Anemia    Arthritis    Bronchial stenosis, right    Cancer (HCC)    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   Chronic bronchitis (HCC)    Diabetes mellitus without complication (HCC)    GERD (gastroesophageal reflux disease)    High cholesterol    History of kidney stones    Hypertension    Neuropathy    Pneumonia    Umbilical hernia    Past Surgical History:  Procedure Laterality Date   BRONCHIAL WASHINGS N/A 01/16/2023   Procedure: BRONCHIAL WASHINGS;  Surgeon: Parris Manna, MD;  Location: [facility] ORS;  Service: Thoracic;  Laterality: N/A;   CARPAL TUNNEL RELEASE Right    COLONOSCOPY WITH PROPOFOL  N/A 06/25/2015   Procedure: COLONOSCOPY WITH PROPOFOL ;  Surgeon: Lamar ONEIDA Holmes, MD;  Location: [facility] ENDOSCOPY;  Service: Endoscopy;  Laterality: N/A;   ESOPHAGOGASTRODUODENOSCOPY (EGD) WITH PROPOFOL  N/A 06/25/2015   Procedure: ESOPHAGOGASTRODUODENOSCOPY (EGD) WITH PROPOFOL ;  Surgeon: Lamar ONEIDA Holmes, MD;  Location: [facility] ENDOSCOPY;  Service: Endoscopy;  Laterality: N/A;   EYE SURGERY     eyelids x2   EYE SURGERY     cataract x 2 - intraocular implants   FLEXIBLE BRONCHOSCOPY N/A 01/16/2023   Procedure: FLEXIBLE BRONCHOSCOPY;  Surgeon: Parris Manna, MD;   Location: [facility] ORS;  Service: Thoracic;  Laterality: N/A;   HERNIA REPAIR     inguinal   JOINT REPLACEMENT Right    knee   KNEE ARTHROSCOPY Right    acl repair   TOTAL HIP ARTHROPLASTY Left 09/07/2023   Procedure: ARTHROPLASTY, HIP, TOTAL, ANTERIOR APPROACH;  Surgeon: Lorelle Hussar, MD;  Location: [facility] ORS;  Service: Orthopedics;  Laterality: Left;  spinal with sedation   Patient Active Problem List   Diagnosis Date Noted   S/P total left hip arthroplasty 09/07/2023   Umbilical hernia without obstruction and without gangrene 01/26/2018   Diabetic polyneuropathy associated with type 2 diabetes mellitus (HCC) 10/13/2017   GERD (gastroesophageal reflux disease) 04/13/2015   Diabetes mellitus type 2, uncomplicated (HCC) 03/14/2014   Hypertension 03/14/2014   PCP: Dr. Norleen Rower  REFERRING PROVIDER: Lonni Amber, PA-C  REFERRING DIAG: S/P Left Total Hip Arthorplasty  THERAPY DIAG:  Muscle weakness (generalized) - Plan: PT plan of care cert/re-cert  Difficulty in walking, not elsewhere classified - Plan: PT plan of care cert/re-cert  Abnormality of gait and mobility - Plan: PT plan of care cert/re-cert  Status post total hip replacement, left - Plan: PT plan of care cert/re-cert  Unsteadiness on feet - Plan: PT plan of care cert/re-cert  Rationale for Evaluation and Treatment: Rehabilitation  ONSET DATE: surgery  on 09/07/23  SUBJECTIVE:   SUBJECTIVE STATEMENT: Pt reports that his L hip is feeling good, but continues to have more pain in his R hip.  States that he will be seeing pulmonology and thoracic surgeon on 8/26 to address.  States that his LLE has been feeling better and better, but the R hip has been bothering him more frequently, with similar pain pattern as  Feels like he is walking more frequently, but speed has not improved per pt reports.   PERTINENT HISTORY: S/p Left THA (Anterior approach) by Dr. Lorelle on 09/07/2023- due to OA with pain worsening. Pt  also has some bronchial trunk scarring which may require surgery once he recovers from his hip surgery.  PAIN:  Are you having pain? <0/10 Left hip pain . 3/10 in the R Hip.   PRECAUTIONS: No precautions  WEIGHT BEARING RESTRICTIONS: WBAT  FALLS:  Has patient fallen in last 6 months? No  LIVING ENVIRONMENT: Lives with: lives with their spouse Lives in: House/apartment Stairs: Yes: Internal: 15 steps; on left going up and External: 3 steps; on right going up Has following equipment at home: Single point cane, Walker - 2 wheeled, shower chair, and bed side commode  OCCUPATION: Retired  PLOF: Independent  PATIENT GOALS: Recover- improve my strength and balance  NEXT MD VISIT: Dr. Lorelle- End of July  OBJECTIVE:  Note: Objective measures were completed at evaluation unless otherwise noted.  PATIENT SURVEYS:  LEFS eval: 37  LOWER EXTREMITY MMT:  MMT Right eval Left eval  Hip flexion 5 4  Hip extension 5 4  Hip abduction 5 4  Hip adduction 5 4  Hip internal rotation 5 NT  Hip external rotation 5 NT  Knee flexion 5 4  Knee extension 5 4  Ankle dorsiflexion 5 4    FUNCTIONAL TESTS:  5 times sit to stand: 21.82 sec without UE support Timed up and go (TUG): 16.82 sec with use of SPC 6 minute walk test: 946ft AMB in 32m6s on 09/28/23 )(SPC, 1 LOB)  10 meter walk test: 0.59m/s on 09/28/23 Berg Balance Scale: 45/56 at eval                                                                                                                              TREATMENT DATE 11/11/23  -LEFS survey Pt performed 5 time sit<>stand (5xSTS): 10.23 sec (>15 sec indicates increased fall risk)   PT instructed pt in TUG: 10.93 sec without UE Support sec (average of 3 trials; >13.5 sec indicates increased fall risk)  -SLS assessent (unchanged) 2 sec max per foot  Patient demonstrates increased fall risk as noted by score of   51/56 on Berg Balance Scale.  (<36= high risk for falls, close to 100%;  37-45 significant >80%; 46-51 moderate >50%; 52-55 lower >25%)   PATIENT EDUCATION:  Education details: Purpose of PT: Review retest findings   Person educated: Patient Education method: Explanation, Demonstration, Tactile  cues, and Verbal cues Education comprehension: verbalized understanding, returned demonstration, verbal cues required, tactile cues required, and needs further education  HOME EXERCISE PROGRAM: Access Code: [facility] URL: [email or web address] Date: 09/28/2023 Prepared by: Peggye Linear  Exercises - Sit to Stand with Arms Crossed  - 1 x daily - 2 sets - 10 reps - Supine Bridge  - 1 x daily - 2 sets - 10 reps - Side Stepping with Counter Support  - 1 x daily - 2 sets - 10 reps - Side Stepping with Counter Support  - 1 x daily - 2 sets - 20 reps - Seated Long Arc Quad  - 1 x daily - 2 sets - 15 reps - 5 hold - Heel Raises with Counter Support  - 1 x daily - 3 sets - 15 reps - Standing March with Counter Support  - 1 x daily - 2 sets - 20 reps Recommended on 10/20/23 to begin practice on SLS at home   ASSESSMENT:  CLINICAL IMPRESSION: Pt presents for PT now 8 weeks post PT eval. Pt reports that he has been doing HEP and modifying exercises with new/worsening pain in the R hip. Has continued to demonstrate improved strength, gait speed and safety with mobility as indicated on Berg, 5x STS, TUG and LEFS. Due to continued progress and meeting 5/6 LTG and continued independence and reduced pain on the L hip. No additional PT required at this point. Pt may required PT services for L hip replacement in near future, but has not had in person visit with MD to address R hip pain.     OBJECTIVE IMPAIRMENTS: Abnormal gait, decreased activity tolerance, decreased balance, decreased coordination, decreased endurance, decreased mobility, difficulty walking, decreased strength, and pain.  ACTIVITY LIMITATIONS: carrying, lifting, bending, sitting, standing, squatting, and  stairs PARTICIPATION LIMITATIONS: meal prep, cleaning, laundry, shopping, community activity, and yard work PERSONAL FACTORS: 1-2 comorbidities: arthritis and bronchial issues are also affecting patient's functional outcome.  REHAB POTENTIAL: Good CLINICAL DECISION MAKING: Stable/uncomplicated EVALUATION COMPLEXITY: Low  GOALS: Goals reviewed with patient? Yes  SHORT TERM GOALS: Target date: 11/05/2023 Pt will be independent with HEP in order to decrease ankle pain and increase strength in order to improve pain-free function at home.  Baseline: EVAL- Will need updated HEP as he progresses and currently performing supine and standing from HHPT.  Pt reports that he has been exercising regularly. But has noticed pain in the R hip similar to pain in the LLE that required hip replacement.  Goal status: MET   LONG TERM GOALS: Target date: 12/17/2023  Pt will increase LEFS by at least 9 points in order to demonstrate significant improvement in lower extremity function.   Baseline: EVAL= 37/80; 83.8% LEFS 10/20/23 8/13: 85% Goal status: MET   2.  Pt will decrease worst pain in left hip as reported on NPRS by to 1/10 or no pain in order to demonstrate clinically significant reduction in ankle/foot pain. Baseline: EVAL= 1-2/10; 10/20/23: <1/10  8/13: 0/10 in the L hip.  Goal status: MET   3.  Patient will improve his SLS balance by 5 sec on Left LE for improved hip stability with standing/walking.  Baseline: EVAL - 1-2 sec hold only on LLE; 7/22 unchanged (limited by severe ankle/foot postural changes) 8/13: 1- 2 sec bil.  Goal status: NOT MET   4.  Pt will improve BERG by at least 3 points in order to demonstrate clinically significant improvement in balance. Baseline: EVAL= 46/56; 10/20/23 (did not reassess  due to time)  8/13: 51 Goal status: MET  5.  Pt will decrease 5TSTS by at least 7 seconds in order to demonstrate clinically significant improvement in LE strength. Baseline: EVAL= 21.82  sec without UE support; 10/20/23: 11.33sec hands free  8/13: 10.23 Goal status: MET  6.  Pt will decrease TUG to below 14 seconds/decrease in order to demonstrate decreased fall risk. Baseline: EVAL= Pt will decrease TUG to below 14 seconds/decrease in order to demonstrate decreased fall risk; 13.12 sec  8/13: 10.93 sec without UE Support Goal status: MET  PLAN:  PT FREQUENCY: 1-2x/week  PT DURATION: 12 weeks  PLANNED INTERVENTIONS: 97164- PT Re-evaluation, 97110-Therapeutic exercises, 97530- Therapeutic activity, 97112- Neuromuscular re-education, 97535- Self Care, 02859- Manual therapy, [phone]- Gait training, [phone]- Orthotic Initial, [phone]- Orthotic/Prosthetic subsequent, [phone]- Canalith repositioning, [phone]- Electrical stimulation (manual), [id number]- Ultrasound, 79439 (1-2 muscles), 20561 (3+ muscles)- Dry Needling, Patient/Family education, Balance training, Stair training, Taping, Joint mobilization, Joint manipulation, Spinal manipulation, Spinal mobilization, Scar mobilization, Vestibular training, DME instructions, Cryotherapy, and Moist heat  PLAN FOR NEXT SESSION:  NA.   4:19 PM, 11/11/23   Massie FORBES Dollar, PT 11/11/2023, 4:19 PM   4:19 PM, 11/11/23 Peggye JAYSON Linear, PT, DPT Physical Therapist - [facility]  [phone] [facility])

## 2023-11-16 ENCOUNTER — Encounter: Admitting: Physical Therapy

## 2023-11-18 ENCOUNTER — Encounter: Admitting: Physical Therapy

## 2023-11-23 ENCOUNTER — Ambulatory Visit: Admitting: Physical Therapy

## 2023-11-24 ENCOUNTER — Encounter: Payer: Self-pay | Admitting: *Deleted

## 2023-11-24 ENCOUNTER — Other Ambulatory Visit: Payer: Self-pay | Admitting: Thoracic Surgery (Cardiothoracic Vascular Surgery)

## 2023-11-24 ENCOUNTER — Encounter: Payer: Self-pay | Admitting: Thoracic Surgery (Cardiothoracic Vascular Surgery)

## 2023-11-24 ENCOUNTER — Other Ambulatory Visit: Payer: Self-pay | Admitting: *Deleted

## 2023-11-24 ENCOUNTER — Ambulatory Visit
Attending: Thoracic Surgery (Cardiothoracic Vascular Surgery) | Admitting: Thoracic Surgery (Cardiothoracic Vascular Surgery)

## 2023-11-24 VITALS — BP 132/80 | HR 75 | Resp 20 | Ht 73.5 in | Wt 217.1 lb

## 2023-11-24 DIAGNOSIS — R918 Other nonspecific abnormal finding of lung field: Secondary | ICD-10-CM

## 2023-11-24 DIAGNOSIS — R911 Solitary pulmonary nodule: Secondary | ICD-10-CM | POA: Diagnosis not present

## 2023-11-24 DIAGNOSIS — Z5181 Encounter for therapeutic drug level monitoring: Secondary | ICD-10-CM

## 2023-11-24 DIAGNOSIS — J9809 Other diseases of bronchus, not elsewhere classified: Secondary | ICD-10-CM

## 2023-11-24 NOTE — H&P (View-Only) (Signed)
 PCP is Rudolpho Norleen BIRCH, MD Referring Provider is Parris Manna, MD  Chief Complaint  Patient presents with   Lung Lesion    F/u after CT Chest 10/26/23    HPI: Dr. 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  His past history is significant for asthma, recurrent pulmonary infections, right lower lobe bronchial stenosis, type 2 diabetes complicated by neuropathy, hypertension, hyperlipidemia, and gastroesophageal reflux.  Over the past 15 or 16 years she has had frequent pulmonary infections.  In August 2024 he had a particularly difficult infection and was treated with about 3 different antibiotics over the course of a month.  He was referred to Dr. Aleskerov.  CT of the chest showed focal wall thickening and luminal narrowing of the basilar segmental trunk of the right lower lobe.  There was also evidence of scarring and atelectasis.  Dr. Aleskerov for bronchoscopy.  There was extrinsic narrowing of the right lower lobe bronchus suspicious of takeoff of the superior segmental bronchus.  Biopsy showed no evidence of malignancy.  There was fibrosis, hemosiderin, and multinucleated giant cells.  I saw him in March.  He was doing well from a respiratory standpoint and did not want to proceed with surgery.  He was having a lot of hip pain.  He subsequently had a left hip replacement.  This summer he had a lot of difficulty with coughing and mucus production.  Recently had a CT and now returns to discuss possible surgical resection.  Zubrod Score: At the time of surgery this patient's most appropriate activity status/level should be described as: []     0    Normal activity, no symptoms [x]     1    Restricted in physical strenuous activity but ambulatory, able to do out light work []     2    Ambulatory and capable of self care, unable to do work activities, up and about >50 % of waking hours                              []     3    Only  limited self care, in bed greater than 50% of waking hours []     4    Completely disabled, no self care, confined to bed or chair []     5    Moribund   Past Medical History:  Diagnosis Date   Actinic keratosis    Anemia    Arthritis    Bronchial stenosis, right    Cancer (HCC)    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   Chronic bronchitis (HCC)    Diabetes mellitus without complication (HCC)    GERD (gastroesophageal reflux disease)    High cholesterol    History of kidney stones    Hypertension    Neuropathy    Pneumonia    Umbilical hernia     Past Surgical History:  Procedure Laterality Date   BRONCHIAL WASHINGS N/A 01/16/2023   Procedure: BRONCHIAL WASHINGS;  Surgeon: Parris Manna, MD;  Location: [facility] ORS;  Service: Thoracic;  Laterality: N/A;   CARPAL TUNNEL RELEASE Right    COLONOSCOPY WITH PROPOFOL  N/A 06/25/2015   Procedure: COLONOSCOPY WITH PROPOFOL ;  Surgeon: Lamar ONEIDA Holmes, MD;  Location: [facility] ENDOSCOPY;  Service: Endoscopy;  Laterality: N/A;   ESOPHAGOGASTRODUODENOSCOPY (EGD) WITH PROPOFOL  N/A 06/25/2015   Procedure: ESOPHAGOGASTRODUODENOSCOPY (EGD) WITH PROPOFOL ;  Surgeon: Lamar ONEIDA Holmes, MD;  Location: [facility]  ENDOSCOPY;  Service: Endoscopy;  Laterality: N/A;   EYE SURGERY     eyelids x2   EYE SURGERY     cataract x 2 - intraocular implants   FLEXIBLE BRONCHOSCOPY N/A 01/16/2023   Procedure: FLEXIBLE BRONCHOSCOPY;  Surgeon: Parris Manna, MD;  Location: [facility] ORS;  Service: Thoracic;  Laterality: N/A;   HERNIA REPAIR     inguinal   JOINT REPLACEMENT Right    knee   KNEE ARTHROSCOPY Right    acl repair   TOTAL HIP ARTHROPLASTY Left 09/07/2023   Procedure: ARTHROPLASTY, HIP, TOTAL, ANTERIOR APPROACH;  Surgeon: Lorelle Hussar, MD;  Location: [facility] ORS;  Service: Orthopedics;  Laterality: Left;  spinal with sedation    No family history on file.  Social History Social History   Tobacco Use   Smoking status: Former    Types: Cigarettes   Smokeless tobacco: Never   Vaping Use   Vaping status: Never Used  Substance Use Topics   Alcohol use: Yes    Comment: rare   Drug use: No    Current Outpatient Medications  Medication Sig Dispense Refill   acetaminophen  (TYLENOL ) 500 MG tablet Take 500-1,000 mg by mouth See admin instructions. Take 1000 mg in the morning and 500 mg in the evening     albuterol (VENTOLIN HFA) 108 (90 Base) MCG/ACT inhaler Inhale 2 puffs into the lungs.     amoxicillin (AMOXIL) 500 MG capsule Take 2,000 mg by mouth See admin instructions. 1 hour prior to dental work     aspirin  EC 81 MG tablet Take 81 mg by mouth daily. Swallow whole.     Cyanocobalamin (VITAMIN B-12) 6000 MCG SUBL Place 6,000 mcg under the tongue daily.     enoxaparin  (LOVENOX ) 40 MG/0.4ML injection Inject 0.4 mLs (40 mg total) into the skin daily for 14 days. 5.6 mL 0   Ferrous Sulfate (IRON PO) Take 1 tablet by mouth daily.     fluocinonide gel (LIDEX) 0.05 % Apply 1 application  topically daily as needed (dry lips/mouth).     fluorouracil  (EFUDEX ) 5 % cream Apply to scalp twice a day x 10 days 30 g 2   folic acid (FOLVITE) 400 MCG tablet Take 800 mcg by mouth daily.     glipiZIDE (GLUCOTROL) 5 MG tablet Take 5 mg by mouth daily before breakfast.     guaiFENesin (MUCINEX) 600 MG 12 hr tablet Take by mouth 2 (two) times daily.     HYDROcodone -acetaminophen  (NORCO/VICODIN) 5-325 MG tablet Take 1-2 tablets by mouth every 4 (four) hours as needed for severe pain (pain score 7-10). 30 tablet 0   ibuprofen (ADVIL) 200 MG tablet Take 200-400 mg by mouth See admin instructions. Take 400 mg in the morning and 200 mg in the evening     loratadine (CLARITIN) 10 MG tablet Take 10 mg by mouth daily.     losartan  (COZAAR ) 50 MG tablet Take 50 mg by mouth daily.     metFORMIN (GLUCOPHAGE) 1000 MG tablet Take 2,000 mg by mouth daily.     Multiple Vitamins-Minerals (ADULT ONE DAILY GUMMIES PO) Take 2 capsules by mouth daily.     omeprazole (PRILOSEC) 40 MG capsule Take 40 mg by  mouth daily.     PRESCRIPTION MEDICATION Apply 1 Application topically daily. Azelaic acid, Ivermectin, metronidazole compounded cream     simvastatin  (ZOCOR ) 20 MG tablet Take 20 mg by mouth daily.     traMADol  (ULTRAM ) 50 MG tablet Take 1 tablet (50 mg total)  by mouth every 6 (six) hours as needed for moderate pain (pain score 4-6). 30 tablet 0   triamcinolone ointment (KENALOG) 0.1 % Apply 1 Application topically 3 (three) times daily as needed.     No current facility-administered medications for this visit.    Allergies  Allergen Reactions   Other Shortness Of Breath    Siemese cats   Symbicort [Budesonide-Formoterol Fumarate] Shortness Of Breath    Review of Systems  Constitutional:  Negative for activity change and unexpected weight change.  HENT:  Positive for hearing loss.   Respiratory:  Positive for cough (Productive) and wheezing.   Musculoskeletal:  Positive for arthralgias and gait problem (Better after hip replacement).  Neurological:  Positive for numbness (Both feet).  All other systems reviewed and are negative.   BP 132/80 (BP Location: Left Arm, Patient Position: Sitting, Cuff Size: Normal)   Pulse 75   Resp 20   Ht 6' 1.5 (1.867 m)   Wt 217 lb 1.6 oz (98.5 kg)   SpO2 98% Comment: RA  BMI 28.25 kg/m  Physical Exam Vitals reviewed.  Constitutional:      General: He is not in acute distress.    Appearance: Normal appearance.  HENT:     Head: Normocephalic and atraumatic.  Eyes:     General:        Right eye: No discharge.     Extraocular Movements: Extraocular movements intact.  Cardiovascular:     Rate and Rhythm: Normal rate and regular rhythm.     Heart sounds: Normal heart sounds. No murmur heard. Pulmonary:     Breath sounds: Stridor (Right base) present. Wheezing (Faint right base) present.  Abdominal:     General: There is no distension.     Palpations: Abdomen is soft.  Skin:    General: Skin is warm and dry.  Neurological:      General: No focal deficit present.     Mental Status: He is alert and oriented to person, place, and time.     Cranial Nerves: No cranial nerve deficit.     Motor: No weakness.    Diagnostic Tests: CT CHEST WITH CONTRAST   TECHNIQUE: Multidetector CT imaging of the chest was performed during intravenous contrast administration.   RADIATION DOSE REDUCTION: This exam was performed according to the departmental dose-optimization program which includes automated exposure control, adjustment of the mA and/or kV according to patient size and/or use of iterative reconstruction technique.   CONTRAST:  50mL OMNIPAQUE  IOHEXOL  350 MG/ML SOLN   COMPARISON:  CT 07/02/2023 and older.  PET-CT 04/08/2023.   FINDINGS: Cardiovascular: Heart is nonenlarged. No pericardial effusion. Coronary artery calcifications are seen. The thoracic aorta is normal course and caliber with slight atherosclerotic plaque.   Mediastinum/Nodes: Preserved thyroid gland. Esophagus has a normal course and caliber in the thorax. No discrete abnormal lymph node enlargement identified in the axillary region, hilum or mediastinum.   Lungs/Pleura: Once again there is some bandlike opacity in the right lower lobe. New from previous in the right lower lobe are areas opacity along the course of the bronchi please see coronal series 601, image 86, axial series 302, image 91. This could be mucous plugging. However this is in the area of stricture seen on prior examination from April 2025 and could be contributory. There is a differential. Otherwise no consolidation, pneumothorax or effusion.   Upper Abdomen: Fatty liver infiltration identified. Adrenal glands are preserved. Slight elevation of the right hemidiaphragm.   Musculoskeletal: Mild  degenerative changes.   IMPRESSION: Increasing bronchial opacity in the area of previous potential structure in the right lower lobe anteriorly with increasing bandlike opacity in  the right lower lobe with volume loss favoring atelectasis. With the opacity in the previous stricture aggressive process is not excluded. There is some bronchial thickening this location as well. Please correlate for any known history. Further workup versus short follow-up.   No pathologic enlarged lymph nodes in the thorax. Fatty liver infiltration.   Aortic Atherosclerosis (ICD10-I70.0).     Electronically Signed   By: Ranell Bring M.D.   On: 10/26/2023 13:28 I personally reviewed the CT images.  There has been progression of the bronchial opacity with likely mucous plugging.  No discrete mass visible.  More atelectasis and scarring.  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  His past history is significant for asthma, recurrent pulmonary infections, right lower lobe bronchial stenosis, type 2 diabetes complicated by neuropathy, hypertension, hyperlipidemia, and gastroesophageal reflux.  Right lower lobe bronchial abnormality-noted to have bronchial narrowing by Dr. Parris back in 2024.  Has a history of recurrent pulmonary infections.  Recently has had an exacerbation of symptoms with increased mucus production and more frequent and severe coughing.  Not having fevers or chills so I do not think antibiotics would be particularly useful and could select out more resistant organisms prior to surgical resection.  We discussed surgical resection for management of his problem.  His lower lobe is extremely small and I do not think will have much impact on his respiratory capacity.  I think without resection he is likely to continue to have recurrent infections.    The plan would be to do a robotic assisted right lower lobectomy.  If any of the lower lobe can be safely spared we would do so.  I informed Dr. and Mrs. Mottola of the general nature of the procedure including the need for general anesthesia, the incisions to be used, the use of the surgical robot,  the use of a drainage tube postoperatively, the expected hospital stay, and the overall recovery.  I informed them of the indications, risks, benefits, and alternatives.  They understand the risks include, but are not limited to death, MI, DVT, PE, bleeding, possible need for transfusion, possible need for conversion to open procedure, infection, prolonged air leak, cardiac arrhythmias, as well as the possibility of other unforeseeable complications.  He accepts the risks and wishes to proceed.  Plan: Pulmonary function testing with and without bronchodilators Bronchoscopy and robotic assisted right lower lobectomy on Friday 12/11/2023  [name], MD [facility] [phone]

## 2023-11-24 NOTE — Progress Notes (Signed)
 PCP is Rudolpho Norleen BIRCH, MD Referring Provider is Parris Manna, MD  Chief Complaint  Patient presents with   Lung Lesion    F/u after CT Chest 10/26/23    HPI: Dr. 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  His past history is significant for asthma, recurrent pulmonary infections, right lower lobe bronchial stenosis, type 2 diabetes complicated by neuropathy, hypertension, hyperlipidemia, and gastroesophageal reflux.  Over the past 15 or 16 years she has had frequent pulmonary infections.  In August 2024 he had a particularly difficult infection and was treated with about 3 different antibiotics over the course of a month.  He was referred to Dr. Aleskerov.  CT of the chest showed focal wall thickening and luminal narrowing of the basilar segmental trunk of the right lower lobe.  There was also evidence of scarring and atelectasis.  Dr. Aleskerov for bronchoscopy.  There was extrinsic narrowing of the right lower lobe bronchus suspicious of takeoff of the superior segmental bronchus.  Biopsy showed no evidence of malignancy.  There was fibrosis, hemosiderin, and multinucleated giant cells.  I saw him in March.  He was doing well from a respiratory standpoint and did not want to proceed with surgery.  He was having a lot of hip pain.  He subsequently had a left hip replacement.  This summer he had a lot of difficulty with coughing and mucus production.  Recently had a CT and now returns to discuss possible surgical resection.  Zubrod Score: At the time of surgery this patient's most appropriate activity status/level should be described as: []     0    Normal activity, no symptoms [x]     1    Restricted in physical strenuous activity but ambulatory, able to do out light work []     2    Ambulatory and capable of self care, unable to do work activities, up and about >50 % of waking hours                              []     3    Only  limited self care, in bed greater than 50% of waking hours []     4    Completely disabled, no self care, confined to bed or chair []     5    Moribund   Past Medical History:  Diagnosis Date   Actinic keratosis    Anemia    Arthritis    Bronchial stenosis, right    Cancer (HCC)    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   Chronic bronchitis (HCC)    Diabetes mellitus without complication (HCC)    GERD (gastroesophageal reflux disease)    High cholesterol    History of kidney stones    Hypertension    Neuropathy    Pneumonia    Umbilical hernia     Past Surgical History:  Procedure Laterality Date   BRONCHIAL WASHINGS N/A 01/16/2023   Procedure: BRONCHIAL WASHINGS;  Surgeon: Parris Manna, MD;  Location: [facility] ORS;  Service: Thoracic;  Laterality: N/A;   CARPAL TUNNEL RELEASE Right    COLONOSCOPY WITH PROPOFOL  N/A 06/25/2015   Procedure: COLONOSCOPY WITH PROPOFOL ;  Surgeon: Lamar ONEIDA Holmes, MD;  Location: [facility] ENDOSCOPY;  Service: Endoscopy;  Laterality: N/A;   ESOPHAGOGASTRODUODENOSCOPY (EGD) WITH PROPOFOL  N/A 06/25/2015   Procedure: ESOPHAGOGASTRODUODENOSCOPY (EGD) WITH PROPOFOL ;  Surgeon: Lamar ONEIDA Holmes, MD;  Location: [facility]  ENDOSCOPY;  Service: Endoscopy;  Laterality: N/A;   EYE SURGERY     eyelids x2   EYE SURGERY     cataract x 2 - intraocular implants   FLEXIBLE BRONCHOSCOPY N/A 01/16/2023   Procedure: FLEXIBLE BRONCHOSCOPY;  Surgeon: Parris Manna, MD;  Location: [facility] ORS;  Service: Thoracic;  Laterality: N/A;   HERNIA REPAIR     inguinal   JOINT REPLACEMENT Right    knee   KNEE ARTHROSCOPY Right    acl repair   TOTAL HIP ARTHROPLASTY Left 09/07/2023   Procedure: ARTHROPLASTY, HIP, TOTAL, ANTERIOR APPROACH;  Surgeon: Lorelle Hussar, MD;  Location: [facility] ORS;  Service: Orthopedics;  Laterality: Left;  spinal with sedation    No family history on file.  Social History Social History   Tobacco Use   Smoking status: Former    Types: Cigarettes   Smokeless tobacco: Never   Vaping Use   Vaping status: Never Used  Substance Use Topics   Alcohol use: Yes    Comment: rare   Drug use: No    Current Outpatient Medications  Medication Sig Dispense Refill   acetaminophen  (TYLENOL ) 500 MG tablet Take 500-1,000 mg by mouth See admin instructions. Take 1000 mg in the morning and 500 mg in the evening     albuterol (VENTOLIN HFA) 108 (90 Base) MCG/ACT inhaler Inhale 2 puffs into the lungs.     amoxicillin (AMOXIL) 500 MG capsule Take 2,000 mg by mouth See admin instructions. 1 hour prior to dental work     aspirin  EC 81 MG tablet Take 81 mg by mouth daily. Swallow whole.     Cyanocobalamin (VITAMIN B-12) 6000 MCG SUBL Place 6,000 mcg under the tongue daily.     enoxaparin  (LOVENOX ) 40 MG/0.4ML injection Inject 0.4 mLs (40 mg total) into the skin daily for 14 days. 5.6 mL 0   Ferrous Sulfate (IRON PO) Take 1 tablet by mouth daily.     fluocinonide gel (LIDEX) 0.05 % Apply 1 application  topically daily as needed (dry lips/mouth).     fluorouracil  (EFUDEX ) 5 % cream Apply to scalp twice a day x 10 days 30 g 2   folic acid (FOLVITE) 400 MCG tablet Take 800 mcg by mouth daily.     glipiZIDE (GLUCOTROL) 5 MG tablet Take 5 mg by mouth daily before breakfast.     guaiFENesin (MUCINEX) 600 MG 12 hr tablet Take by mouth 2 (two) times daily.     HYDROcodone -acetaminophen  (NORCO/VICODIN) 5-325 MG tablet Take 1-2 tablets by mouth every 4 (four) hours as needed for severe pain (pain score 7-10). 30 tablet 0   ibuprofen (ADVIL) 200 MG tablet Take 200-400 mg by mouth See admin instructions. Take 400 mg in the morning and 200 mg in the evening     loratadine (CLARITIN) 10 MG tablet Take 10 mg by mouth daily.     losartan  (COZAAR ) 50 MG tablet Take 50 mg by mouth daily.     metFORMIN (GLUCOPHAGE) 1000 MG tablet Take 2,000 mg by mouth daily.     Multiple Vitamins-Minerals (ADULT ONE DAILY GUMMIES PO) Take 2 capsules by mouth daily.     omeprazole (PRILOSEC) 40 MG capsule Take 40 mg by  mouth daily.     PRESCRIPTION MEDICATION Apply 1 Application topically daily. Azelaic acid, Ivermectin, metronidazole compounded cream     simvastatin  (ZOCOR ) 20 MG tablet Take 20 mg by mouth daily.     traMADol  (ULTRAM ) 50 MG tablet Take 1 tablet (50 mg total)  by mouth every 6 (six) hours as needed for moderate pain (pain score 4-6). 30 tablet 0   triamcinolone ointment (KENALOG) 0.1 % Apply 1 Application topically 3 (three) times daily as needed.     No current facility-administered medications for this visit.    Allergies  Allergen Reactions   Other Shortness Of Breath    Siemese cats   Symbicort [Budesonide-Formoterol Fumarate] Shortness Of Breath    Review of Systems  Constitutional:  Negative for activity change and unexpected weight change.  HENT:  Positive for hearing loss.   Respiratory:  Positive for cough (Productive) and wheezing.   Musculoskeletal:  Positive for arthralgias and gait problem (Better after hip replacement).  Neurological:  Positive for numbness (Both feet).  All other systems reviewed and are negative.   BP 132/80 (BP Location: Left Arm, Patient Position: Sitting, Cuff Size: Normal)   Pulse 75   Resp 20   Ht 6' 1.5 (1.867 m)   Wt 217 lb 1.6 oz (98.5 kg)   SpO2 98% Comment: RA  BMI 28.25 kg/m  Physical Exam Vitals reviewed.  Constitutional:      General: He is not in acute distress.    Appearance: Normal appearance.  HENT:     Head: Normocephalic and atraumatic.  Eyes:     General:        Right eye: No discharge.     Extraocular Movements: Extraocular movements intact.  Cardiovascular:     Rate and Rhythm: Normal rate and regular rhythm.     Heart sounds: Normal heart sounds. No murmur heard. Pulmonary:     Breath sounds: Stridor (Right base) present. Wheezing (Faint right base) present.  Abdominal:     General: There is no distension.     Palpations: Abdomen is soft.  Skin:    General: Skin is warm and dry.  Neurological:      General: No focal deficit present.     Mental Status: He is alert and oriented to person, place, and time.     Cranial Nerves: No cranial nerve deficit.     Motor: No weakness.    Diagnostic Tests: CT CHEST WITH CONTRAST   TECHNIQUE: Multidetector CT imaging of the chest was performed during intravenous contrast administration.   RADIATION DOSE REDUCTION: This exam was performed according to the departmental dose-optimization program which includes automated exposure control, adjustment of the mA and/or kV according to patient size and/or use of iterative reconstruction technique.   CONTRAST:  50mL OMNIPAQUE  IOHEXOL  350 MG/ML SOLN   COMPARISON:  CT 07/02/2023 and older.  PET-CT 04/08/2023.   FINDINGS: Cardiovascular: Heart is nonenlarged. No pericardial effusion. Coronary artery calcifications are seen. The thoracic aorta is normal course and caliber with slight atherosclerotic plaque.   Mediastinum/Nodes: Preserved thyroid gland. Esophagus has a normal course and caliber in the thorax. No discrete abnormal lymph node enlargement identified in the axillary region, hilum or mediastinum.   Lungs/Pleura: Once again there is some bandlike opacity in the right lower lobe. New from previous in the right lower lobe are areas opacity along the course of the bronchi please see coronal series 601, image 86, axial series 302, image 91. This could be mucous plugging. However this is in the area of stricture seen on prior examination from April 2025 and could be contributory. There is a differential. Otherwise no consolidation, pneumothorax or effusion.   Upper Abdomen: Fatty liver infiltration identified. Adrenal glands are preserved. Slight elevation of the right hemidiaphragm.   Musculoskeletal: Mild  degenerative changes.   IMPRESSION: Increasing bronchial opacity in the area of previous potential structure in the right lower lobe anteriorly with increasing bandlike opacity in  the right lower lobe with volume loss favoring atelectasis. With the opacity in the previous stricture aggressive process is not excluded. There is some bronchial thickening this location as well. Please correlate for any known history. Further workup versus short follow-up.   No pathologic enlarged lymph nodes in the thorax. Fatty liver infiltration.   Aortic Atherosclerosis (ICD10-I70.0).     Electronically Signed   By: Ranell Bring M.D.   On: 10/26/2023 13:28 I personally reviewed the CT images.  There has been progression of the bronchial opacity with likely mucous plugging.  No discrete mass visible.  More atelectasis and scarring.  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  His past history is significant for asthma, recurrent pulmonary infections, right lower lobe bronchial stenosis, type 2 diabetes complicated by neuropathy, hypertension, hyperlipidemia, and gastroesophageal reflux.  Right lower lobe bronchial abnormality-noted to have bronchial narrowing by Dr. Parris back in 2024.  Has a history of recurrent pulmonary infections.  Recently has had an exacerbation of symptoms with increased mucus production and more frequent and severe coughing.  Not having fevers or chills so I do not think antibiotics would be particularly useful and could select out more resistant organisms prior to surgical resection.  We discussed surgical resection for management of his problem.  His lower lobe is extremely small and I do not think will have much impact on his respiratory capacity.  I think without resection he is likely to continue to have recurrent infections.    The plan would be to do a robotic assisted right lower lobectomy.  If any of the lower lobe can be safely spared we would do so.  I informed Dr. and Mrs. Mottola of the general nature of the procedure including the need for general anesthesia, the incisions to be used, the use of the surgical robot,  the use of a drainage tube postoperatively, the expected hospital stay, and the overall recovery.  I informed them of the indications, risks, benefits, and alternatives.  They understand the risks include, but are not limited to death, MI, DVT, PE, bleeding, possible need for transfusion, possible need for conversion to open procedure, infection, prolonged air leak, cardiac arrhythmias, as well as the possibility of other unforeseeable complications.  He accepts the risks and wishes to proceed.  Plan: Pulmonary function testing with and without bronchodilators Bronchoscopy and robotic assisted right lower lobectomy on Friday 12/11/2023  [name], MD [facility] [phone]

## 2023-11-25 ENCOUNTER — Ambulatory Visit: Admitting: Physical Therapy

## 2023-12-01 ENCOUNTER — Ambulatory Visit: Admitting: Thoracic Surgery (Cardiothoracic Vascular Surgery)

## 2023-12-02 ENCOUNTER — Ambulatory Visit: Admitting: Physical Therapy

## 2023-12-07 ENCOUNTER — Ambulatory Visit: Admitting: Physical Therapy

## 2023-12-08 NOTE — Pre-Procedure Instructions (Signed)
 Surgical Instructions   Your procedure is scheduled on December 11, 2023. Report to [facility] Main Entrance A at 5:30 A.M., then check in with the Admitting office. Any questions or running late day of surgery: call [phone]  Questions prior to your surgery date: call [phone], Monday-Friday, 8am-4pm. If you experience any cold or flu symptoms such as cough, fever, chills, shortness of breath, etc. between now and your scheduled surgery, please notify us  at the above number.     Remember:  Do not eat or drink after midnight the night before your surgery    Take these medicines the morning of surgery with A SIP OF WATER: acetaminophen  (TYLENOL )  albuterol  (VENTOLIN  HFA) inhaler  loratadine  (CLARITIN )  omeprazole  (PRILOSEC)  simvastatin  (ZOCOR )    Continue taking your Aspirin  through the day before surgery. DO NOT take any the morning of surgery.   One week prior to surgery, STOP taking any Aleve, Naproxen, Ibuprofen, Motrin, Advil, Goody's, BC's, all herbal medications, fish oil, and non-prescription vitamins.   WHAT DO I DO ABOUT MY DIABETES MEDICATION?   Do not take glipiZIDE  (GLUCOTROL ) the evening before surgery or the morning of surgery.  Do not take metFORMIN  (GLUCOPHAGE ) 2 days prior to surgery. Your last dose will be September 9th.   HOW TO MANAGE YOUR DIABETES BEFORE AND AFTER SURGERY  Why is it important to control my blood sugar before and after surgery? Improving blood sugar levels before and after surgery helps healing and can limit problems. A way of improving blood sugar control is eating a healthy diet by:  Eating less sugar and carbohydrates  Increasing activity/exercise  Talking with your doctor about reaching your blood sugar goals High blood sugars (greater than 180 mg/dL) can raise your risk of infections and slow your recovery, so you will need to focus on controlling your diabetes during the weeks before surgery. Make sure that the  doctor who takes care of your diabetes knows about your planned surgery including the date and location.  How do I manage my blood sugar before surgery? Check your blood sugar at least 4 times a day, starting 2 days before surgery, to make sure that the level is not too high or low.  Check your blood sugar the morning of your surgery when you wake up and every 2 hours until you get to the Short Stay unit.  If your blood sugar is less than 70 mg/dL, you will need to treat for low blood sugar: Do not take insulin . Treat a low blood sugar (less than 70 mg/dL) with  cup of clear juice (cranberry or apple), 4 glucose tablets, OR glucose gel. Recheck blood sugar in 15 minutes after treatment (to make sure it is greater than 70 mg/dL). If your blood sugar is not greater than 70 mg/dL on recheck, call [phone] for further instructions. Report your blood sugar to the short stay nurse when you get to Short Stay.  If you are admitted to the hospital after surgery: Your blood sugar will be checked by the staff and you will probably be given insulin  after surgery (instead of oral diabetes medicines) to make sure you have good blood sugar levels. The goal for blood sugar control after surgery is 80-180 mg/dL.                      Do NOT Smoke (Tobacco/Vaping) for 24 hours prior to your procedure.  If you use a CPAP at night, you may bring your  mask/headgear for your overnight stay.   You will be asked to remove any contacts, glasses, piercing's, hearing aid's, dentures/partials prior to surgery. Please bring cases for these items if needed.    Patients discharged the day of surgery will not be allowed to drive home, and someone needs to stay with them for 24 hours.  SURGICAL WAITING ROOM VISITATION Patients may have no more than 2 support people in the waiting area - these visitors may rotate.   Pre-op nurse will coordinate an appropriate time for 1 ADULT support person, who may not rotate, to  accompany patient in pre-op.  Children under the age of 29 must have an adult with them who is not the patient and must remain in the main waiting area with an adult.  If the patient needs to stay at the hospital during part of their recovery, the visitor guidelines for inpatient rooms apply.  Please refer to the [facility] website for the visitor guidelines for any additional information.   If you received a COVID test during your pre-op visit  it is requested that you wear a mask when out in public, stay away from anyone that may not be feeling well and notify your surgeon if you develop symptoms. If you have been in contact with anyone that has tested positive in the last 10 days please notify you surgeon.      Pre-operative CHG Bathing Instructions   You can play a key role in reducing the risk of infection after surgery. Your skin needs to be as free of germs as possible. You can reduce the number of germs on your skin by washing with CHG (chlorhexidine  gluconate) soap before surgery. CHG is an antiseptic soap that kills germs and continues to kill germs even after washing.   DO NOT use if you have an allergy to chlorhexidine /CHG or antibacterial soaps. If your skin becomes reddened or irritated, stop using the CHG and notify one of our RNs at [phone].              TAKE A SHOWER THE NIGHT BEFORE SURGERY AND THE DAY OF SURGERY    Please keep in mind the following:  DO NOT shave, including legs and underarms, 48 hours prior to surgery.   You may shave your face before/day of surgery.  Place clean sheets on your bed the night before surgery Use a clean washcloth (not used since being washed) for each shower. DO NOT sleep with pet's night before surgery.  CHG Shower Instructions:  Wash your face and private area with normal soap. If you choose to wash your hair, wash first with your normal shampoo.  After you use shampoo/soap, rinse your hair and body thoroughly to remove  shampoo/soap residue.  Turn the water OFF and apply half the bottle of CHG soap to a CLEAN washcloth.  Apply CHG soap ONLY FROM YOUR NECK DOWN TO YOUR TOES (washing for 3-5 minutes)  DO NOT use CHG soap on face, private areas, open wounds, or sores.  Pay special attention to the area where your surgery is being performed.  If you are having back surgery, having someone wash your back for you may be helpful. Wait 2 minutes after CHG soap is applied, then you may rinse off the CHG soap.  Pat dry with a clean towel  Put on clean pajamas    Additional instructions for the day of surgery: DO NOT APPLY any lotions, deodorants, cologne, or perfumes.   Do not wear  jewelry or makeup Do not wear nail polish, gel polish, artificial nails, or any other type of covering on natural nails (fingers and toes) Do not bring valuables to the hospital. [facility] is not responsible for valuables/personal belongings. Put on clean/comfortable clothes.  Please brush your teeth.  Ask your nurse before applying any prescription medications to the skin.

## 2023-12-09 ENCOUNTER — Ambulatory Visit: Admitting: Physical Therapy

## 2023-12-09 ENCOUNTER — Inpatient Hospital Stay (HOSPITAL_COMMUNITY)
Admission: RE | Admit: 2023-12-09 | Discharge: 2023-12-09 | Disposition: A | Discharge status: Home / Self Care | Source: Ambulatory Visit | Attending: Thoracic Surgery (Cardiothoracic Vascular Surgery)

## 2023-12-09 ENCOUNTER — Encounter (HOSPITAL_COMMUNITY): Payer: Self-pay

## 2023-12-09 ENCOUNTER — Other Ambulatory Visit: Payer: Self-pay

## 2023-12-09 ENCOUNTER — Ambulatory Visit (HOSPITAL_COMMUNITY)
Admission: RE | Admit: 2023-12-09 | Discharge: 2023-12-09 | Disposition: A | Discharge status: Home / Self Care | Source: Ambulatory Visit | Attending: Thoracic Surgery (Cardiothoracic Vascular Surgery) | Admitting: Thoracic Surgery (Cardiothoracic Vascular Surgery)

## 2023-12-09 VITALS — BP 126/61 | HR 74 | Temp 97.8°F

## 2023-12-09 DIAGNOSIS — Z5181 Encounter for therapeutic drug level monitoring: Secondary | ICD-10-CM

## 2023-12-09 DIAGNOSIS — J9809 Other diseases of bronchus, not elsewhere classified: Secondary | ICD-10-CM

## 2023-12-09 DIAGNOSIS — E119 Type 2 diabetes mellitus without complications: Secondary | ICD-10-CM | POA: Insufficient documentation

## 2023-12-09 DIAGNOSIS — R918 Other nonspecific abnormal finding of lung field: Secondary | ICD-10-CM | POA: Insufficient documentation

## 2023-12-09 DIAGNOSIS — Z01818 Encounter for other preprocedural examination: Secondary | ICD-10-CM

## 2023-12-09 LAB — COMPREHENSIVE METABOLIC PANEL WITH GFR
ALT: 22 U/L (ref 0–44)
AST: 27 U/L (ref 15–41)
Albumin: 3.6 g/dL (ref 3.5–5.0)
Alkaline Phosphatase: 56 U/L (ref 38–126)
Anion gap: 5 (ref 5–15)
BUN: 21 mg/dL (ref 8–23)
CO2: 30 mmol/L (ref 22–32)
Calcium: 9.8 mg/dL (ref 8.9–10.3)
Chloride: 103 mmol/L (ref 98–111)
Creatinine, Ser: 0.92 mg/dL (ref 0.61–1.24)
GFR, Estimated: >60 mL/min (ref >60)
Glucose, Bld: 127 mg/dL — ABNORMAL HIGH (ref 70–99)
Potassium: 4.7 mmol/L (ref 3.5–5.1)
Sodium: 138 mmol/L (ref 135–145)
Total Bilirubin: 0.5 mg/dL (ref 0.0–1.2)
Total Protein: 6.4 g/dL — ABNORMAL LOW (ref 6.5–8.1)

## 2023-12-09 LAB — PULMONARY FUNCTION TEST
DL/VA % pred: 90 %
DL/VA: 3.48 ml/min/mmHg/L
DLCO unc % pred: 81 %
DLCO unc: 22.27 ml/min/mmHg
FEF 25-75 Post: 2.33 L/s
FEF 25-75 Pre: 2.01 L/s
FEF2575-%Change-Post: 16 %
FEF2575-%Pred-Post: 96 %
FEF2575-%Pred-Pre: 83 %
FEV1-%Change-Post: 4 %
FEV1-%Pred-Post: 81 %
FEV1-%Pred-Pre: 77 %
FEV1-Post: 2.76 L
FEV1-Pre: 2.64 L
FEV1FVC-%Change-Post: 0 %
FEV1FVC-%Pred-Pre: 102 %
FEV6-%Change-Post: 5 %
FEV6-%Pred-Post: 84 %
FEV6-%Pred-Pre: 80 %
FEV6-Post: 3.75 L
FEV6-Pre: 3.56 L
FEV6FVC-%Change-Post: 0 %
FEV6FVC-%Pred-Post: 105 %
FEV6FVC-%Pred-Pre: 105 %
FVC-%Change-Post: 5 %
FVC-%Pred-Post: 80 %
FVC-%Pred-Pre: 75 %
FVC-Post: 3.78 L
FVC-Pre: 3.58 L
Post FEV1/FVC ratio: 73 %
Post FEV6/FVC ratio: 99 %
Pre FEV1/FVC ratio: 74 %
Pre FEV6/FVC Ratio: 99 %
RV % pred: 131 %
RV: 3.7 L
TLC % pred: 97 %
TLC: 7.54 L

## 2023-12-09 LAB — URINALYSIS, ROUTINE W REFLEX MICROSCOPIC
Bilirubin Urine: NEGATIVE
Glucose, UA: 150 mg/dL — AB
Hgb urine dipstick: NEGATIVE
Ketones, ur: NEGATIVE mg/dL
Leukocytes,Ua: NEGATIVE
Nitrite: NEGATIVE
Protein, ur: NEGATIVE mg/dL
Specific Gravity, Urine: 1.02 (ref 1.005–1.030)
pH: 5 (ref 5.0–8.0)

## 2023-12-09 LAB — CBC
HCT: 42.3 % (ref 39.0–52.0)
Hemoglobin: 13.2 g/dL (ref 13.0–17.0)
MCH: 29.6 pg (ref 26.0–34.0)
MCHC: 31.2 g/dL (ref 30.0–36.0)
MCV: 94.8 fL (ref 80.0–100.0)
Platelets: 282 K/uL (ref 150–400)
RBC: 4.46 MIL/uL (ref 4.22–5.81)
RDW: 14.6 % (ref 11.5–15.5)
WBC: 6.4 K/uL (ref 4.0–10.5)
nRBC: 0 % (ref 0.0–0.2)

## 2023-12-09 LAB — SURGICAL PCR SCREEN
MRSA, PCR: NEGATIVE
Staphylococcus aureus: NEGATIVE

## 2023-12-09 LAB — PROTIME-INR
INR: 1 (ref 0.8–1.2)
Prothrombin Time: 13.9 s (ref 11.4–15.2)

## 2023-12-09 LAB — APTT: aPTT: 27 s (ref 24–36)

## 2023-12-09 LAB — GLUCOSE, CAPILLARY: Glucose-Capillary: 163 mg/dL — ABNORMAL HIGH (ref 70–99)

## 2023-12-09 MED ORDER — ALBUTEROL SULFATE (2.5 MG/3ML) 0.083% IN NEBU
2.5000 mg | INHALATION_SOLUTION | Freq: Once | RESPIRATORY_TRACT | Status: AC
Start: 2023-12-09 — End: 2023-12-09
  Administered 2023-12-09: 2.5 mg via RESPIRATORY_TRACT

## 2023-12-09 NOTE — Progress Notes (Addendum)
 PCP - Norleen Rudolpho COME Cardiologist - denies Pulmonologist - Fuad Aleskerov,MD  PPM/ICD - denies Device Orders -  Rep Notified -   Chest x-ray - 12/09/23 EKG - 12/09/23 Stress Test - denies ECHO - denies Cardiac Cath - denies  Sleep Study - denies; STOP-Bang sent to PCP CPAP - no  Fasting Blood Sugar - 100-120 Checks Blood Sugar pt rarely checks his blood sugar;usually has it checked at the doctor's. He states he does have a blood sugar meter and agrees to check his blood sugar the day of surgery.   Last dose of GLP1 agonist- na  GLP1 instructions:   Blood Thinner Instructions:na Aspirin  Instructions:Continue taking your Aspirin  through the day before surgery. DO NOT take any the morning of surgery.   ERAS Protcol -no PRE-SURGERY Ensure or G2-   COVID TEST- na   Anesthesia review: yes-hx recurrent bronchitis ,RLL bronchial stenosis, HTN,HLD,DM2  Patient denies shortness of breath, fever, cough and chest pain at PAT appointment   All instructions explained to the patient, with a verbal understanding of the material. Patient agrees to go over the instructions while at home for a better understanding. The opportunity to ask questions was provided.

## 2023-12-10 LAB — HEMOGLOBIN A1C
Hgb A1c MFr Bld: 6.4 % — ABNORMAL HIGH (ref 4.8–5.6)
Mean Plasma Glucose: 136.98 mg/dL

## 2023-12-10 NOTE — Anesthesia Preprocedure Evaluation (Signed)
 Anesthesia Evaluation  Patient identified by MRN, date of birth, ID band Patient awake    Reviewed: Allergy & Precautions, NPO status , Patient's Chart, lab work & pertinent test results  History of Anesthesia Complications Negative for: history of anesthetic complications  Airway Mallampati: II  TM Distance: >3 FB Neck ROM: Full    Dental  (+) Dental Advisory Given   Pulmonary shortness of breath and with exertion, pneumonia, former smoker   Pulmonary exam normal breath sounds clear to auscultation       Cardiovascular hypertension, (-) angina (-) Past MI and (-) DOE Normal cardiovascular exam Rhythm:Regular Rate:Normal     Neuro/Psych  Neuromuscular disease  negative psych ROS   GI/Hepatic Neg liver ROS,GERD  Controlled,,  Endo/Other  diabetes, Type 2    Renal/GU      Musculoskeletal  (+) Arthritis ,    Abdominal   Peds  Hematology  (+) Blood dyscrasia, anemia   Anesthesia Other Findings   Reproductive/Obstetrics                              Anesthesia Physical Anesthesia Plan  ASA: 3  Anesthesia Plan: General   Post-op Pain Management: Tylenol  PO (pre-op)*   Induction:   PONV Risk Score and Plan: 3 and Ondansetron , Treatment may vary due to age or medical condition and Dexamethasone   Airway Management Planned: Double Lumen EBT and Oral ETT  Additional Equipment: Arterial line  Intra-op Plan:   Post-operative Plan: Extubation in OR  Informed Consent: I have reviewed the patients History and Physical, chart, labs and discussed the procedure including the risks, benefits and alternatives for the proposed anesthesia with the patient or authorized representative who has indicated his/her understanding and acceptance.     Dental advisory given  Plan Discussed with: CRNA  Anesthesia Plan Comments: (2 x PIV)        Anesthesia Quick Evaluation

## 2023-12-11 ENCOUNTER — Inpatient Hospital Stay (HOSPITAL_COMMUNITY)

## 2023-12-11 ENCOUNTER — Other Ambulatory Visit: Payer: Self-pay

## 2023-12-11 ENCOUNTER — Encounter (HOSPITAL_COMMUNITY)
Admission: RE | Disposition: A | Discharge status: Home / Self Care | Payer: Self-pay | Source: Home / Self Care | Attending: Thoracic Surgery (Cardiothoracic Vascular Surgery)

## 2023-12-11 ENCOUNTER — Encounter (HOSPITAL_COMMUNITY): Payer: Self-pay | Admitting: Thoracic Surgery (Cardiothoracic Vascular Surgery)

## 2023-12-11 ENCOUNTER — Inpatient Hospital Stay (HOSPITAL_COMMUNITY): Payer: Self-pay | Admitting: Anesthesiology

## 2023-12-11 ENCOUNTER — Inpatient Hospital Stay (HOSPITAL_COMMUNITY): Payer: Self-pay | Admitting: Vascular Surgery

## 2023-12-11 ENCOUNTER — Inpatient Hospital Stay (HOSPITAL_COMMUNITY)
Admission: RE | Admit: 2023-12-11 | Discharge: 2023-12-13 | DRG: 163 | Disposition: A | Discharge status: Home / Self Care | Attending: Thoracic Surgery (Cardiothoracic Vascular Surgery) | Admitting: Thoracic Surgery (Cardiothoracic Vascular Surgery)

## 2023-12-11 DIAGNOSIS — Z87891 Personal history of nicotine dependence: Secondary | ICD-10-CM

## 2023-12-11 DIAGNOSIS — Z79899 Other long term (current) drug therapy: Secondary | ICD-10-CM | POA: Diagnosis not present

## 2023-12-11 DIAGNOSIS — J9811 Atelectasis: Secondary | ICD-10-CM | POA: Diagnosis present

## 2023-12-11 DIAGNOSIS — Z96642 Presence of left artificial hip joint: Secondary | ICD-10-CM | POA: Diagnosis present

## 2023-12-11 DIAGNOSIS — Z7984 Long term (current) use of oral hypoglycemic drugs: Secondary | ICD-10-CM | POA: Diagnosis not present

## 2023-12-11 DIAGNOSIS — Z7982 Long term (current) use of aspirin: Secondary | ICD-10-CM | POA: Diagnosis not present

## 2023-12-11 DIAGNOSIS — K219 Gastro-esophageal reflux disease without esophagitis: Secondary | ICD-10-CM | POA: Diagnosis present

## 2023-12-11 DIAGNOSIS — Z85828 Personal history of other malignant neoplasm of skin: Secondary | ICD-10-CM | POA: Diagnosis not present

## 2023-12-11 DIAGNOSIS — E7800 Pure hypercholesterolemia, unspecified: Secondary | ICD-10-CM | POA: Diagnosis present

## 2023-12-11 DIAGNOSIS — E119 Type 2 diabetes mellitus without complications: Principal | ICD-10-CM

## 2023-12-11 DIAGNOSIS — J9809 Other diseases of bronchus, not elsewhere classified: Principal | ICD-10-CM | POA: Diagnosis present

## 2023-12-11 DIAGNOSIS — J850 Gangrene and necrosis of lung: Secondary | ICD-10-CM | POA: Diagnosis present

## 2023-12-11 DIAGNOSIS — I10 Essential (primary) hypertension: Secondary | ICD-10-CM

## 2023-12-11 DIAGNOSIS — Z8701 Personal history of pneumonia (recurrent): Secondary | ICD-10-CM | POA: Diagnosis not present

## 2023-12-11 DIAGNOSIS — Z9889 Other specified postprocedural states: Secondary | ICD-10-CM

## 2023-12-11 DIAGNOSIS — E1140 Type 2 diabetes mellitus with diabetic neuropathy, unspecified: Secondary | ICD-10-CM | POA: Diagnosis present

## 2023-12-11 DIAGNOSIS — Z902 Acquired absence of lung [part of]: Secondary | ICD-10-CM

## 2023-12-11 HISTORY — PX: LOBECTOMY, LUNG, ROBOT-ASSISTED, USING VATS: SHX7607

## 2023-12-11 HISTORY — PX: VIDEO BRONCHOSCOPY: SHX5072

## 2023-12-11 HISTORY — PX: INTERCOSTAL NERVE BLOCK: SHX5021

## 2023-12-11 LAB — GLUCOSE, CAPILLARY
Glucose-Capillary: 119 mg/dL — ABNORMAL HIGH (ref 70–99)
Glucose-Capillary: 189 mg/dL — ABNORMAL HIGH (ref 70–99)
Glucose-Capillary: 184 mg/dL — ABNORMAL HIGH (ref 70–99)
Glucose-Capillary: 166 mg/dL — ABNORMAL HIGH (ref 70–99)
Glucose-Capillary: 217 mg/dL — ABNORMAL HIGH (ref 70–99)

## 2023-12-11 LAB — TYPE AND SCREEN
ABO/RH(D): O POS
Antibody Screen: NEGATIVE
Unit division: 0
Unit division: 0

## 2023-12-11 LAB — BPAM RBC
Blood Product Expiration Date: 202510082359
Blood Product Expiration Date: 202510082359
ISSUE DATE / TIME: 202509120951
ISSUE DATE / TIME: 202509120951
Unit Type and Rh: 5100
Unit Type and Rh: 5100

## 2023-12-11 LAB — PREPARE RBC (CROSSMATCH)

## 2023-12-11 SURGERY — BRONCHOSCOPY, VIDEO-ASSISTED
Anesthesia: General | Site: Chest | Laterality: Right

## 2023-12-11 MED ORDER — PROPOFOL 10 MG/ML IV BOLUS
INTRAVENOUS | Status: DC | PRN
  Administered 2023-12-11: 110 mg via INTRAVENOUS
  Administered 2023-12-11: 40 mg via INTRAVENOUS

## 2023-12-11 MED ORDER — ONDANSETRON HCL 4 MG/2ML IJ SOLN
INTRAMUSCULAR | Status: DC | PRN
  Administered 2023-12-11: 4 mg via INTRAVENOUS

## 2023-12-11 MED ORDER — ORAL CARE MOUTH RINSE: 15.0000 mL | OROMUCOSAL | Status: DC | PRN

## 2023-12-11 MED ORDER — CHLORHEXIDINE GLUCONATE 0.12 % MT SOLN
OROMUCOSAL | Status: AC
  Administered 2023-12-11: 15 mL via OROMUCOSAL
  Filled 2023-12-11: qty 15

## 2023-12-11 MED ORDER — TRAMADOL HCL 50 MG PO TABS
50.0000 mg | ORAL_TABLET | Freq: Four times a day (QID) | ORAL | Status: DC | PRN
  Administered 2023-12-12 – 2023-12-13 (×4): 100 mg via ORAL
  Filled 2023-12-11 (×4): qty 2

## 2023-12-11 MED ORDER — SODIUM CHLORIDE (PF) 0.9 % IJ SOLN
INTRAMUSCULAR | Status: AC
  Filled 2023-12-11: qty 50

## 2023-12-11 MED ORDER — LIDOCAINE 2% (20 MG/ML) 5 ML SYRINGE
INTRAMUSCULAR | Status: AC
  Filled 2023-12-11: qty 5

## 2023-12-11 MED ORDER — PROPOFOL 10 MG/ML IV BOLUS
INTRAVENOUS | Status: AC
  Filled 2023-12-11: qty 20

## 2023-12-11 MED ORDER — ACETAMINOPHEN 500 MG PO TABS
ORAL_TABLET | ORAL | Status: AC
  Administered 2023-12-11: 500 mg via ORAL
  Filled 2023-12-11: qty 2

## 2023-12-11 MED ORDER — HYDROMORPHONE HCL 1 MG/ML IJ SOLN: 0.2500 mg | INTRAMUSCULAR | Status: DC | PRN

## 2023-12-11 MED ORDER — LOSARTAN POTASSIUM 50 MG PO TABS
50.0000 mg | ORAL_TABLET | Freq: Every day | ORAL | Status: DC
Start: 2023-12-12 — End: 2023-12-13
  Administered 2023-12-12 – 2023-12-13 (×2): 50 mg via ORAL
  Filled 2023-12-11 (×2): qty 1

## 2023-12-11 MED ORDER — CHLORHEXIDINE GLUCONATE 0.12 % MT SOLN: 15.0000 mL | Freq: Once | OROMUCOSAL | Status: AC

## 2023-12-11 MED ORDER — ROCURONIUM BROMIDE 10 MG/ML (PF) SYRINGE
PREFILLED_SYRINGE | INTRAVENOUS | Status: AC
  Filled 2023-12-11: qty 10

## 2023-12-11 MED ORDER — ORAL CARE MOUTH RINSE: 15.0000 mL | Freq: Once | OROMUCOSAL | Status: AC

## 2023-12-11 MED ORDER — SIMVASTATIN 20 MG PO TABS
20.0000 mg | ORAL_TABLET | Freq: Every day | ORAL | Status: DC
Start: 2023-12-12 — End: 2023-12-13
  Administered 2023-12-12 – 2023-12-13 (×2): 20 mg via ORAL
  Filled 2023-12-11 (×2): qty 1

## 2023-12-11 MED ORDER — ACETAMINOPHEN 500 MG PO TABS: 1000.0000 mg | ORAL_TABLET | Freq: Once | ORAL | Status: AC

## 2023-12-11 MED ORDER — ASPIRIN 81 MG PO TBEC
81.0000 mg | DELAYED_RELEASE_TABLET | Freq: Every day | ORAL | Status: DC
  Administered 2023-12-12 – 2023-12-13 (×2): 81 mg via ORAL
  Filled 2023-12-11 (×2): qty 1

## 2023-12-11 MED ORDER — LACTATED RINGERS IV SOLN
INTRAVENOUS | Status: DC
Start: 2023-12-11 — End: 2023-12-11

## 2023-12-11 MED ORDER — PANTOPRAZOLE SODIUM 40 MG PO TBEC: 40.0000 mg | DELAYED_RELEASE_TABLET | Freq: Every day | ORAL | Status: DC

## 2023-12-11 MED ORDER — GABAPENTIN 300 MG PO CAPS: 300.0000 mg | ORAL_CAPSULE | Freq: Two times a day (BID) | ORAL | Status: DC

## 2023-12-11 MED ORDER — SODIUM CHLORIDE 0.9% IV SOLUTION
INTRAVENOUS | Status: AC | PRN
  Administered 2023-12-11: 1000 mL via INTRAMUSCULAR

## 2023-12-11 MED ORDER — FOLIC ACID 400 MCG PO TABS: 800.0000 ug | ORAL_TABLET | Freq: Every day | ORAL | Status: DC

## 2023-12-11 MED ORDER — FENTANYL CITRATE (PF) 250 MCG/5ML IJ SOLN
INTRAMUSCULAR | Status: AC
  Filled 2023-12-11: qty 5

## 2023-12-11 MED ORDER — LORATADINE 10 MG PO TABS
10.0000 mg | ORAL_TABLET | Freq: Every day | ORAL | Status: DC
Start: 2023-12-12 — End: 2023-12-13
  Administered 2023-12-12 – 2023-12-13 (×2): 10 mg via ORAL
  Filled 2023-12-11 (×2): qty 1

## 2023-12-11 MED ORDER — ACETAMINOPHEN 160 MG/5ML PO SOLN: 1000.0000 mg | Freq: Four times a day (QID) | ORAL | Status: DC

## 2023-12-11 MED ORDER — EPINEPHRINE PF 1 MG/ML IJ SOLN
INTRAMUSCULAR | Status: DC | PRN
  Administered 2023-12-11: 1 mL

## 2023-12-11 MED ORDER — PHENYLEPHRINE 80 MCG/ML (10ML) SYRINGE FOR IV PUSH (FOR BLOOD PRESSURE SUPPORT)
PREFILLED_SYRINGE | INTRAVENOUS | Status: AC
  Filled 2023-12-11: qty 10

## 2023-12-11 MED ORDER — BUPIVACAINE LIPOSOME 1.3 % IJ SUSP
INTRAMUSCULAR | Status: AC
Start: 2023-12-11 — End: 2023-12-11
  Filled 2023-12-11: qty 20

## 2023-12-11 MED ORDER — DEXAMETHASONE SODIUM PHOSPHATE 10 MG/ML IJ SOLN
INTRAMUSCULAR | Status: DC | PRN
  Administered 2023-12-11: 10 mg via INTRAVENOUS

## 2023-12-11 MED ORDER — INSULIN ASPART 100 UNIT/ML IJ SOLN
0.0000 [IU] | INTRAMUSCULAR | Status: DC
  Administered 2023-12-11: 8 [IU] via SUBCUTANEOUS
  Administered 2023-12-11: 4 [IU] via SUBCUTANEOUS
  Administered 2023-12-12: 8 [IU] via SUBCUTANEOUS
  Administered 2023-12-12 (×2): 2 [IU] via SUBCUTANEOUS

## 2023-12-11 MED ORDER — ONDANSETRON HCL 4 MG/2ML IJ SOLN
INTRAMUSCULAR | Status: AC
  Filled 2023-12-11: qty 2

## 2023-12-11 MED ORDER — DEXAMETHASONE SODIUM PHOSPHATE 10 MG/ML IJ SOLN
INTRAMUSCULAR | Status: AC
Start: 2023-12-11 — End: 2023-12-11
  Filled 2023-12-11: qty 1

## 2023-12-11 MED ORDER — ALBUTEROL SULFATE (2.5 MG/3ML) 0.083% IN NEBU: 2.5000 mg | INHALATION_SOLUTION | Freq: Four times a day (QID) | RESPIRATORY_TRACT | Status: DC | PRN

## 2023-12-11 MED ORDER — CEFAZOLIN SODIUM-DEXTROSE 2-4 GM/100ML-% IV SOLN
INTRAVENOUS | Status: AC
  Filled 2023-12-11: qty 100

## 2023-12-11 MED ORDER — BISACODYL 5 MG PO TBEC
10.0000 mg | DELAYED_RELEASE_TABLET | Freq: Every day | ORAL | Status: DC
  Administered 2023-12-11 – 2023-12-13 (×3): 10 mg via ORAL
  Filled 2023-12-11 (×3): qty 2

## 2023-12-11 MED ORDER — GABAPENTIN 300 MG PO CAPS
300.0000 mg | ORAL_CAPSULE | Freq: Every day | ORAL | Status: DC
  Administered 2023-12-11 – 2023-12-12 (×2): 300 mg via ORAL
  Filled 2023-12-11 (×2): qty 1

## 2023-12-11 MED ORDER — CEFAZOLIN SODIUM-DEXTROSE 2-4 GM/100ML-% IV SOLN
2.0000 g | INTRAVENOUS | Status: AC
  Administered 2023-12-11: 2 g via INTRAVENOUS

## 2023-12-11 MED ORDER — ALBUTEROL SULFATE HFA 108 (90 BASE) MCG/ACT IN AERS: 2.0000 | INHALATION_SPRAY | Freq: Every morning | RESPIRATORY_TRACT | Status: DC

## 2023-12-11 MED ORDER — SUGAMMADEX SODIUM 200 MG/2ML IV SOLN
INTRAVENOUS | Status: DC | PRN
  Administered 2023-12-11: 200 mg via INTRAVENOUS

## 2023-12-11 MED ORDER — HEMOSTATIC AGENTS (NO CHARGE) OPTIME
TOPICAL | Status: DC | PRN
  Administered 2023-12-11: 1 via TOPICAL

## 2023-12-11 MED ORDER — PHENYLEPHRINE HCL-NACL 20-0.9 MG/250ML-% IV SOLN
INTRAVENOUS | Status: DC | PRN
  Administered 2023-12-11: 25 ug/min via INTRAVENOUS

## 2023-12-11 MED ORDER — INSULIN ASPART 100 UNIT/ML IJ SOLN: 0.0000 [IU] | INTRAMUSCULAR | Status: DC | PRN

## 2023-12-11 MED ORDER — FENTANYL CITRATE (PF) 250 MCG/5ML IJ SOLN
INTRAMUSCULAR | Status: DC | PRN
  Administered 2023-12-11 (×2): 50 ug via INTRAVENOUS
  Administered 2023-12-11: 100 ug via INTRAVENOUS

## 2023-12-11 MED ORDER — PANTOPRAZOLE SODIUM 40 MG PO TBEC
40.0000 mg | DELAYED_RELEASE_TABLET | Freq: Every day | ORAL | Status: DC
  Filled 2023-12-11: qty 1

## 2023-12-11 MED ORDER — FOLIC ACID 1 MG PO TABS
1.0000 mg | ORAL_TABLET | Freq: Every day | ORAL | Status: DC
  Administered 2023-12-11 – 2023-12-13 (×3): 1 mg via ORAL
  Filled 2023-12-11 (×3): qty 1

## 2023-12-11 MED ORDER — MORPHINE SULFATE (PF) 2 MG/ML IV SOLN: 2.0000 mg | INTRAVENOUS | Status: DC | PRN

## 2023-12-11 MED ORDER — KETOROLAC TROMETHAMINE 15 MG/ML IJ SOLN
15.0000 mg | Freq: Four times a day (QID) | INTRAMUSCULAR | Status: DC
  Administered 2023-12-11 – 2023-12-12 (×4): 15 mg via INTRAVENOUS
  Filled 2023-12-11 (×3): qty 1

## 2023-12-11 MED ORDER — BUPIVACAINE HCL (PF) 0.5 % IJ SOLN
INTRAMUSCULAR | Status: AC
  Filled 2023-12-11: qty 30

## 2023-12-11 MED ORDER — ENOXAPARIN SODIUM 40 MG/0.4ML IJ SOSY
40.0000 mg | PREFILLED_SYRINGE | Freq: Every day | INTRAMUSCULAR | Status: DC
  Administered 2023-12-11 – 2023-12-13 (×3): 40 mg via SUBCUTANEOUS
  Filled 2023-12-11 (×3): qty 0.4

## 2023-12-11 MED ORDER — SODIUM CHLORIDE 0.9% IV SOLUTION: Freq: Once | INTRAVENOUS | Status: DC

## 2023-12-11 MED ORDER — CEFAZOLIN SODIUM-DEXTROSE 2-4 GM/100ML-% IV SOLN
2.0000 g | Freq: Three times a day (TID) | INTRAVENOUS | Status: AC
  Administered 2023-12-11 (×2): 2 g via INTRAVENOUS
  Filled 2023-12-11 (×2): qty 100

## 2023-12-11 MED ORDER — ROCURONIUM BROMIDE 10 MG/ML (PF) SYRINGE
PREFILLED_SYRINGE | INTRAVENOUS | Status: DC | PRN
  Administered 2023-12-11: 80 mg via INTRAVENOUS
  Administered 2023-12-11 (×2): 20 mg via INTRAVENOUS

## 2023-12-11 MED ORDER — EPINEPHRINE PF 1 MG/ML IJ SOLN
INTRAMUSCULAR | Status: AC
  Filled 2023-12-11: qty 1

## 2023-12-11 MED ORDER — SODIUM CHLORIDE 0.45 % IV SOLN
INTRAVENOUS | Status: AC
Start: 2023-12-11 — End: 2023-12-12

## 2023-12-11 MED ORDER — SODIUM CHLORIDE FLUSH 0.9 % IV SOLN
INTRAVENOUS | Status: DC | PRN
  Administered 2023-12-11: 100 mL

## 2023-12-11 MED ORDER — DROPERIDOL 2.5 MG/ML IJ SOLN: 0.6250 mg | Freq: Once | INTRAMUSCULAR | Status: DC | PRN

## 2023-12-11 MED ORDER — LIDOCAINE 2% (20 MG/ML) 5 ML SYRINGE
INTRAMUSCULAR | Status: DC | PRN
  Administered 2023-12-11: 60 mg via INTRAVENOUS

## 2023-12-11 MED ORDER — SENNOSIDES-DOCUSATE SODIUM 8.6-50 MG PO TABS
1.0000 | ORAL_TABLET | Freq: Every day | ORAL | Status: DC
  Administered 2023-12-11 – 2023-12-12 (×2): 1 via ORAL
  Filled 2023-12-11 (×2): qty 1

## 2023-12-11 MED ORDER — LUNG SURGERY BOOK
Freq: Once | Status: AC
  Filled 2023-12-11: qty 1

## 2023-12-11 MED ORDER — EPHEDRINE 5 MG/ML INJ
INTRAVENOUS | Status: AC
Start: 2023-12-11 — End: 2023-12-11
  Filled 2023-12-11: qty 5

## 2023-12-11 MED ORDER — ONDANSETRON HCL 4 MG/2ML IJ SOLN: 4.0000 mg | Freq: Four times a day (QID) | INTRAMUSCULAR | Status: DC | PRN

## 2023-12-11 MED ORDER — KETOROLAC TROMETHAMINE 15 MG/ML IJ SOLN
INTRAMUSCULAR | Status: AC
Start: 2023-12-11 — End: 2023-12-11
  Filled 2023-12-11: qty 1

## 2023-12-11 MED ORDER — ACETAMINOPHEN 500 MG PO TABS
1000.0000 mg | ORAL_TABLET | Freq: Four times a day (QID) | ORAL | Status: DC
  Administered 2023-12-11 – 2023-12-13 (×7): 1000 mg via ORAL
  Filled 2023-12-11 (×7): qty 2

## 2023-12-11 SURGICAL SUPPLY — 89 items
ADAPTER VALVE BIOPSY EBUS (MISCELLANEOUS) IMPLANT
APPLICATOR COTTON TIP 6 STRL (MISCELLANEOUS) ×2 IMPLANT
BLADE CLIPPER SURG (BLADE) ×2 IMPLANT
BRUSH CYTOL CELLEBRITY 1.5X140 (MISCELLANEOUS) IMPLANT
CANISTER SUCTION 3000ML PPV (SUCTIONS) ×4 IMPLANT
CANNULA REDUCER 12-8 DVNC XI (CANNULA) ×4 IMPLANT
CNTNR URN SCR LID CUP LEK RST (MISCELLANEOUS) ×10 IMPLANT
CONN ST 1/4X3/8 BEN (MISCELLANEOUS) IMPLANT
COVER BACK TABLE 60X90IN (DRAPES) ×2 IMPLANT
DEFOGGER SCOPE WARM SEASHARP (MISCELLANEOUS) ×2 IMPLANT
DERMABOND ADVANCED .7 DNX12 (GAUZE/BANDAGES/DRESSINGS) ×2 IMPLANT
DRAIN CHANNEL 28F RND 3/8 FF (WOUND CARE) IMPLANT
DRAPE ARM DVNC X/XI (DISPOSABLE) ×8 IMPLANT
DRAPE COLUMN DVNC XI (DISPOSABLE) ×2 IMPLANT
DRAPE CV SPLIT W-CLR ANES SCRN (DRAPES) ×2 IMPLANT
DRAPE HALF SHEET 40X57 (DRAPES) ×2 IMPLANT
DRAPE INCISE IOBAN 66X45 STRL (DRAPES) IMPLANT
DRAPE SURG ORHT 6 SPLT 77X108 (DRAPES) ×2 IMPLANT
ELECT BLADE 6.5 EXT (BLADE) ×2 IMPLANT
ELECTRODE REM PT RTRN 9FT ADLT (ELECTROSURGICAL) ×2 IMPLANT
FILTER STRAW FLUID ASPIR (MISCELLANEOUS) IMPLANT
FORCEPS BIOP RJ4 1.8 (CUTTING FORCEPS) IMPLANT
FORCEPS BPLR FENES DVNC XI (FORCEP) IMPLANT
FORCEPS BPLR LNG DVNC XI (INSTRUMENTS) IMPLANT
FORCEPS RADIAL JAW LRG 4 PULM (INSTRUMENTS) IMPLANT
GAUZE 4X4 16PLY ~~LOC~~+RFID DBL (SPONGE) ×2 IMPLANT
GAUZE KITTNER 4X5 RF (MISCELLANEOUS) ×4 IMPLANT
GAUZE SPONGE 4X4 12PLY STRL (GAUZE/BANDAGES/DRESSINGS) ×2 IMPLANT
GLOVE SS BIOGEL STRL SZ 7.5 (GLOVE) ×4 IMPLANT
GLOVE SURG POLYISO LF SZ8 (GLOVE) ×2 IMPLANT
GLOVE SURG SIGNA 7.5 PF LTX (GLOVE) ×2 IMPLANT
GOWN STRL REUS W/ TWL LRG LVL3 (GOWN DISPOSABLE) ×4 IMPLANT
GOWN STRL REUS W/ TWL XL LVL3 (GOWN DISPOSABLE) ×4 IMPLANT
GOWN STRL REUS W/TWL 2XL LVL3 (GOWN DISPOSABLE) ×2 IMPLANT
GRASPER TIP-UP FEN DVNC XI (INSTRUMENTS) IMPLANT
HEMOSTAT SURGICEL 2X14 (HEMOSTASIS) ×6 IMPLANT
IRRIGATION STRYKERFLOW (MISCELLANEOUS) ×2 IMPLANT
KIT BASIN OR (CUSTOM PROCEDURE TRAY) ×2 IMPLANT
KIT CLEAN ENDO COMPLIANCE (KITS) ×2 IMPLANT
KIT SUCTION CATH 14FR (SUCTIONS) IMPLANT
KIT TURNOVER KIT B (KITS) ×2 IMPLANT
MARKER SKIN DUAL TIP RULER LAB (MISCELLANEOUS) ×2 IMPLANT
NDL HYPO 25GX1X1/2 BEV (NEEDLE) ×2 IMPLANT
NDL SPNL 22GX3.5 QUINCKE BK (NEEDLE) ×2 IMPLANT
NEEDLE HYPO 25GX1X1/2 BEV (NEEDLE) ×2 IMPLANT
NEEDLE SPNL 22GX3.5 QUINCKE BK (NEEDLE) ×2 IMPLANT
NS IRRIG 1000ML POUR BTL (IV SOLUTION) ×4 IMPLANT
OIL SILICONE PENTAX (PARTS (SERVICE/REPAIRS)) ×2 IMPLANT
PACK CHEST (CUSTOM PROCEDURE TRAY) ×2 IMPLANT
PAD ARMBOARD POSITIONER FOAM (MISCELLANEOUS) ×4 IMPLANT
PORT ACCESS TROCAR AIRSEAL 12 (TROCAR) ×2 IMPLANT
RELOAD STAPLE 45 2.5 WHT DVNC (STAPLE) IMPLANT
RELOAD STAPLE 45 3.5 BLU DVNC (STAPLE) IMPLANT
RELOAD STAPLE 45 4.3 GRN DVNC (STAPLE) IMPLANT
SCISSORS LAP 5X35 DISP (ENDOMECHANICALS) IMPLANT
SEAL UNIV 5-12 XI (MISCELLANEOUS) ×8 IMPLANT
SET TRI-LUMEN FLTR TB AIRSEAL (TUBING) ×2 IMPLANT
SOLUTION ELECTROSURG ANTI STCK (MISCELLANEOUS) ×2 IMPLANT
SPONGE INTESTINAL PEANUT (DISPOSABLE) IMPLANT
SPONGE T-LAP 18X18 ~~LOC~~+RFID (SPONGE) ×8 IMPLANT
SPONGE T-LAP 4X18 ~~LOC~~+RFID (SPONGE) ×2 IMPLANT
SPONGE TONSIL 1 RF SGL (DISPOSABLE) IMPLANT
STAPLER 45 SUREFORM CVD DVNC (STAPLE) IMPLANT
SUT PROLENE 4-0 RB1 .5 CRCL 36 (SUTURE) IMPLANT
SUT SILK 1 MH (SUTURE) ×2 IMPLANT
SUT SILK 2 0 SH (SUTURE) ×2 IMPLANT
SUT SILK 2 0SH CR/8 30 (SUTURE) IMPLANT
SUT SILK 3 0SH CR/8 30 (SUTURE) IMPLANT
SUT VIC AB 1 CTX36XBRD ANBCTR (SUTURE) ×2 IMPLANT
SUT VIC AB 2-0 CT1 TAPERPNT 27 (SUTURE) IMPLANT
SUT VIC AB 2-0 CTX 36 (SUTURE) ×2 IMPLANT
SUT VIC AB 3-0 X1 27 (SUTURE) ×4 IMPLANT
SUT VICRYL 0 TIES 12 18 (SUTURE) ×2 IMPLANT
SUT VICRYL 0 UR6 27IN ABS (SUTURE) ×4 IMPLANT
SYR 20CC LL (SYRINGE) ×4 IMPLANT
SYR 20ML ECCENTRIC (SYRINGE) ×4 IMPLANT
SYR 5ML LL (SYRINGE) ×2 IMPLANT
SYR 5ML LUER SLIP (SYRINGE) ×2 IMPLANT
SYSTEM RETRIEVAL ANCHOR 15 (MISCELLANEOUS) IMPLANT
SYSTEM SAHARA CHEST DRAIN ATS (WOUND CARE) ×2 IMPLANT
TAPE CLOTH 4X10 WHT NS (GAUZE/BANDAGES/DRESSINGS) ×2 IMPLANT
TOWEL GREEN STERILE (TOWEL DISPOSABLE) ×2 IMPLANT
TOWEL GREEN STERILE FF (TOWEL DISPOSABLE) ×2 IMPLANT
TRAP SPECIMEN MUCUS 40CC (MISCELLANEOUS) ×2 IMPLANT
TRAY FOLEY MTR SLVR 16FR STAT (SET/KITS/TRAYS/PACK) ×2 IMPLANT
TUBE CONNECTING 20X1/4 (TUBING) ×2 IMPLANT
VALVE BIOPSY SINGLE USE (MISCELLANEOUS) ×2 IMPLANT
VALVE SUCTION BRONCHIO DISP (MISCELLANEOUS) ×2 IMPLANT
WATER STERILE IRR 1000ML POUR (IV SOLUTION) ×4 IMPLANT

## 2023-12-11 NOTE — Transfer of Care (Signed)
 Immediate Anesthesia Transfer of Care Note  Patient: Todd Farley  Procedure(s) Performed: BRONCHOSCOPY, VIDEO-ASSISTED (Chest) RIGHT LOBECTOMY, LUNG, ROBOT-ASSISTED, USING VATS (Right: Chest) BLOCK, NERVE, RIGHT INTERCOSTAL (Right: Chest)  Patient Location: PACU  Anesthesia Type:General  Level of Consciousness: awake and alert   Airway & Oxygen Therapy: Patient Spontanous Breathing and Patient connected to face mask oxygen  Post-op Assessment: Report given to RN and Post -op Vital signs reviewed and stable  Post vital signs: Reviewed and stable  Last Vitals:  Vitals Value Taken Time  BP 125/98 12/11/23 11:00  Temp    Pulse 69 12/11/23 11:03  Resp 26 12/11/23 11:03  SpO2 97 % 12/11/23 11:03  Vitals shown include unfiled device data.  Last Pain:  Vitals:   12/11/23 0644  TempSrc:   PainSc: 0-No pain         Complications: There were no known notable events for this encounter.

## 2023-12-11 NOTE — Brief Op Note (Addendum)
 12/11/2023  10:40 AM  PATIENT:  Todd Farley  78 y.o. male  PRE-OPERATIVE DIAGNOSIS:  BRONCHIAL STRICTURE RIGHT LOWER LOBE  POST-OPERATIVE DIAGNOSIS:  BRONCHIAL STRICTURE RIGHT LOWER LOBE  PROCEDURE:  Procedure(s) with comments:  VIDEO ASSISTED BRONCHSCOPY  ROBOTIC ASSISTED RIGHT VIDEO THORACOSCOPY -Right Lower Lobectomy  BLOCK, NERVE, RIGHT INTERCOSTAL (Right)  SURGEON:  Surgeons and Role:    * Kerrin Elspeth BROCKS, MD - Primary  PHYSICIAN ASSISTANT: Rocky Shad PA-C  ASSISTANTS: none   ANESTHESIA:   general  EBL:  50 mL   BLOOD ADMINISTERED:none  DRAINS: 28 Blake Drain   LOCAL MEDICATIONS USED:  BUPIVICAINE   SPECIMEN:  Source of Specimen:  Right Lower Lobe, Lymph Nodes  DISPOSITION OF SPECIMEN:  PATHOLOGY  COUNTS:  YES  TOURNIQUET:  * No tourniquets in log *  DICTATION: .Dragon Dictation  PLAN OF CARE: Admit to inpatient   PATIENT DISPOSITION:  PACU - hemodynamically stable.   Delay start of Pharmacological VTE agent (>24hrs) due to surgical blood loss or risk of bleeding: no

## 2023-12-11 NOTE — Anesthesia Procedure Notes (Signed)
 Procedure Name: Intubation Date/Time: 12/11/2023 8:12 AM  Performed by: Vanice Search, RNPre-anesthesia Checklist: Patient identified, Emergency Drugs available, Suction available and Patient being monitored Patient Re-evaluated:Patient Re-evaluated prior to induction Oxygen Delivery Method: Circle System Utilized Preoxygenation: Pre-oxygenation with 100% oxygen Induction Type: IV induction Ventilation: Mask ventilation without difficulty Laryngoscope Size: Mac and 4 Grade View: Grade I Tube type: Oral Endobronchial tube: Double lumen EBT and 41 Fr Number of attempts: 1 Airway Equipment and Method: Oral airway Placement Confirmation: ETT inserted through vocal cords under direct vision, positive ETCO2 and breath sounds checked- equal and bilateral (videoscope) Tube secured with: Tape Dental Injury: Teeth and Oropharynx as per pre-operative assessment

## 2023-12-11 NOTE — Hospital Course (Addendum)
 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  His past history is significant for asthma, recurrent pulmonary infections, right lower lobe bronchial stenosis, type 2 diabetes complicated by neuropathy, hypertension, hyperlipidemia, and gastroesophageal reflux.   Over the past 15 or 16 years she has had frequent pulmonary infections.  In August 2024 he had a particularly difficult infection and was treated with about 3 different antibiotics over the course of a month.  He was referred to Dr. Aleskerov.  CT of the chest showed focal wall thickening and luminal narrowing of the basilar segmental trunk of the right lower lobe.  There was also evidence of scarring and atelectasis.   Dr. Aleskerov for bronchoscopy.  There was extrinsic narrowing of the right lower lobe bronchus suspicious of takeoff of the superior segmental bronchus.  Biopsy showed no evidence of malignancy.  There was fibrosis, hemosiderin, and multinucleated giant cells.   I saw him in March.  He was doing well from a respiratory standpoint and did not want to proceed with surgery.  He was having a lot of hip pain.  He subsequently had a left hip replacement.   This summer he had a lot of difficulty with coughing and mucus production.  Recently had a CT and now returns to discuss possible surgical resection.  The patient was evaluated by Dr. Kerrin who recommended Bronchoscopy with right lower lobectomy.  The risks and benefits of the procedure were explained to the patient and he was agreeable to proceed.  Hospital Course:  Todd Farley presented to [facility] on 12/11/2023.  He was taken to the operating room and underwent Video Bronchoscopy with cultures and Robotic Assisted Right Video Thoracoscopy with Right Lower lobectomy, intercostal nerve block, lymph node sampling.  He tolerated the procedure without difficulty, was extubated, and taken to the PACU in stable condition.

## 2023-12-11 NOTE — TOC CM/SW Note (Signed)
 Transition of Care [facility]) - Inpatient Brief Assessment   Patient Details  Name: Todd Farley MRN: [id number] Date of Birth: [birth year]  Transition of [facility] CM/SW Contact:    Lauraine FORBES Saa, LCSWA Phone Number: 12/11/2023, 3:51 PM   Clinical Narrative:  3:51 PM Per chart review, patient resides at home with spouse. Patient has a PCP and insurance. Patient does not have SNF or DME history. Patient has HH history with [facility]. Patient's preferred pharmacy's are [facility] [place] and [facility] [address] [place]. No TOC needs identified at this time. TOC will continue to follow and be available to assist.   Transition of Care Asessment: Insurance and Status: Insurance coverage has been reviewed Patient has primary care physician: Yes Home environment has been reviewed: Private Residence Prior level of function:: N/A Prior/Current Home Services: No current home services Social Drivers of Health Review: SDOH reviewed no interventions necessary Readmission risk has been reviewed: Yes (Currently Green 6%) Transition of care needs: no transition of care needs at this time

## 2023-12-11 NOTE — Anesthesia Procedure Notes (Signed)
 Procedure Name: Intubation Date/Time: 12/11/2023 7:51 AM  Performed by: Vanice Search, RNPre-anesthesia Checklist: Patient identified, Emergency Drugs available, Suction available and Patient being monitored Patient Re-evaluated:Patient Re-evaluated prior to induction Oxygen Delivery Method: Circle System Utilized Preoxygenation: Pre-oxygenation with 100% oxygen Induction Type: IV induction Ventilation: Mask ventilation without difficulty Laryngoscope Size: Mac and 4 Grade View: Grade I Tube type: Oral Tube size: 8.0 mm Number of attempts: 1 Airway Equipment and Method: Stylet and Oral airway Placement Confirmation: ETT inserted through vocal cords under direct vision, positive ETCO2 and breath sounds checked- equal and bilateral Secured at: 23 cm Tube secured with: Tape Dental Injury: Teeth and Oropharynx as per pre-operative assessment

## 2023-12-11 NOTE — Op Note (Signed)
 NAME: Todd Farley, Todd Farley MEDICAL RECORD NO: [id number] ACCOUNT NO: [id number] DATE OF BIRTH: October 06, 1945 FACILITY: [facility] LOCATION: [facility] PHYSICIAN: Todd BROCKS. Kerrin, MD  Operative Report   DATE OF PROCEDURE: 12/11/2023  PREOPERATIVE DIAGNOSIS: Bronchial stricture right lower lobe with recurrent pneumonias.  POSTOPERATIVE DIAGNOSIS: Bronchial stricture right lower lobe with recurrent pneumonias.  PROCEDURE:  Flexible fiberoptic bronchoscopy,  Xi robotic-assisted right lower lobectomy,  Intercostal nerve blocks levels 3 through 10.  SURGEON: Todd BROCKS. Kerrin, MD  ASSISTANT: Todd Shad, PA.  Experienced assistance was necessary for this case due to surgical complexity. Todd Farley assisted with port placement, camera management, robot docking and undocking, instrument exchange, specimen retrieval, suctioning, and wound closure.  ANESTHESIA: General.  FINDINGS: Bronchoscopy- extensive secretions, extrinsic narrowing of the right lower lobe bronchus, necrotic material in airway, but no tumor seen. Intraoperative- right lower lobe failed to deflate. Otherwise, normal anatomy.  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 He had undergone bronchoscopy which showed wall thickening and luminal narrowing of the right lower lobe but no endobronchial tumors were seen. He was referred for surgical resection earlier this year but was intent on having a hip replacement. He now returns having had worsening difficulty with coughing and mucus production and wishes to proceed with surgery.  OPERATIVE NOTE: Todd Farley was brought to the operating room on 12/11/2023. He had induction of general anesthesia and was intubated. Sequential compression devices were placed on the calves for DVT prophylaxis. Intravenous antibiotics were administered. A Foley catheter was placed.    A timeout was performed. Flexible fiberoptic bronchoscopy was performed via the endotracheal tube. The trachea and left-sided airways were normal. The right main stem, right upper lobe, and bronchus intermedius appeared normal. There was necrotic tissue versus inspissated mucus at the origin of the right lower lobe bronchus. The origin of the right lower lobe bronchus was very small relative to the middle lobe. There were extensive secretions. Biopsies were taken and sent for frozen section which returned showing only necrotic cells. There was no evidence of malignancy. The secretions were cleared with saline to the extent possible. The bronchoscope was removed.  He was re-intubated with a double-lumen endotracheal tube. He was placed in a left lateral decubitus position. Single-lung ventilation of the left lung was initiated. A Bair Hugger was placed for active warming. The right chest was prepped and draped in the usual sterile fashion.  A second time-out was performed. A solution containing 20 mL of liposomal bupivacaine , 30 mL of 0.5% bupivacaine  and 50 mL of saline was prepared. This solution was used for local at the incision sites as well as for the intercostal nerve blocks. An incision was made in the eighth interspace in the midaxillary line. The chest was entered bluntly using a hemostat. A robotic port was inserted. The thoracoscope was advanced into the chest confirming intrapleural placement. Carbon dioxide was insufflated per protocol. A 12-mm robotic port was placed in the eighth interspace anterior to the camera port. Intercostal nerve blocks then were performed from the 3rd to the 10th interspace by injecting 10 mL of bupivacaine  solution into a subpleural plane at each level. A 12-mm AirSeal port was placed in the 10th interspace posterolaterally. Two additional 8th interspace robotic ports were placed. The robot was deployed. The camera arm was docked. Targeting was performed. The  remaining arms were docked. The robotic instruments were inserted with thoracoscopic  visualization.  Dissection was begun by dividing the inferior ligament. Of note, the upper and middle lobes were well deflated and there was good isolation of the right lung, but the right lower lobe never did deflate to the degree of the other lobes. A level 9 node was removed. All lymph nodes were sent for permanent pathology. The pleural reflection was divided at the hilum posteriorly up to the level of the right main stem bronchus. Level 7 nodes were removed. The level 11 node at the bifurcation of the right  upper lobe bronchus from the bronchus intermedius was removed. The fissure then was inspected. The fissure was complete near the confluence. There was a node in this area. The pleura overlying it was incised. The node was removed. The fissure then was completed working posteriorly using the robotic stapler. Anteriorly, there was more inflammation around the airways. This required further dissection before being able to divide the anterior portion of the major fissure. The basilar and superior segmental branches of the right lower lobe pulmonary artery were identified. The superior segmental branch was divided using the robotic stapler and then with additional dissection, the basilar segmental branch was divided. The inferior pulmonary vein  then was divided. After completing dissection around the airway, a stapler was placed across the right lower lobe bronchus at its origin and closed. Test inflation showed aeration of the upper and middle lobes. The stapler was fired transecting the bronchus. The vessel loop and sponges used during the dissection were removed. The chest was copiously irrigated with saline. A test inflation to 30 cm of water pressure revealed no significant air leak. A 15-mm endoscopic retrieval bag was placed through the AirSeal port incision and the lobe was manipulated into the bag. The robotic  instruments were removed and the robot was undocked. The anterior eighth interspace incision was lengthened to approximately 3.5 to 4 cm. The bag was brought out through this incision and sent for permanent pathology. No mass was palpable in the specimen. Final inspection was made with the scope for hemostasis. A 28-French Blake drain was placed through the original port incision directed to the apex and secured with #1 silk suture. Dual-lung ventilation was resumed. The remaining incision was closed in standard fashion. All sponge, needle, and instrument counts were correct at the end of the procedure. The patient remained hemodynamically stable. He was extubated in the operating room and taken to the post-anesthetic care unit in good condition.    SUJ D: 12/11/2023 4:10:29 pm T: 12/11/2023 9:56:00 pm  JOB: [id number]/ [id number]

## 2023-12-11 NOTE — Progress Notes (Signed)
 Pt reports fall in the shower last night while using the CHG soap. Stated the soap made the shower slippery. Large bruise noted to left upper extremity. Pt still has full range of motion of the arm. Pt reports soreness but not pain. Arm a little warm to the touch.   Joesph LOISE Stake, RN

## 2023-12-11 NOTE — Interval H&P Note (Signed)
 History and Physical Interval Note:  12/11/2023 7:37 AM  Todd Farley  has presented today for surgery, with the diagnosis of BRONCHIAL STRICTURE RIGHT LOWER LOBE.  The various methods of treatment have been discussed with the patient and family. After consideration of risks, benefits and other options for treatment, the patient has consented to  Procedure(s) with comments: BRONCHOSCOPY, VIDEO-ASSISTED (N/A) LOBECTOMY, LUNG, ROBOT-ASSISTED, USING VATS (Right) - ROBOTIC RIGHT LOWER LOBECTOMY as a surgical intervention.  The patient's history has been reviewed, patient examined, no change in status, stable for surgery.  I have reviewed the patient's chart and labs.  Questions were answered to the patient's satisfaction.     [name]

## 2023-12-11 NOTE — Discharge Summary (Addendum)
 [address] [place] [address]             [phone]        Physician Discharge Summary  Patient ID: Todd Farley MRN: [id number] DOB/AGE: [birth year] 78 y.o.  Admit date: 12/11/2023 Discharge date: 12/13/2023  Admission Diagnoses:Bronchial stenosis right lower lobe  Patient Active Problem List   Diagnosis Date Noted   S/P total left hip arthroplasty 09/07/2023   Umbilical hernia without obstruction and without gangrene 01/26/2018   Diabetic polyneuropathy associated with type 2 diabetes mellitus (HCC) 10/13/2017   GERD (gastroesophageal reflux disease) 04/13/2015   Diabetes mellitus type 2, uncomplicated (HCC) 03/14/2014   Hypertension 03/14/2014   Discharge Diagnoses: Bronchial stenosis right lower lobe Patient Active Problem List   Diagnosis Date Noted   S/P Robotic Assisted Right Video Thoracoscopy with Right Lower Lobectomy, Lymph node dissection, intercostal nerve block 12/11/2023   S/P lobectomy of lung 12/11/2023   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   Over the past 15 or 16 years she has had frequent pulmonary infections.  In August 2024 he had a particularly difficult infection 78 and was treated with about 3 different antibiotics over the course of a month.  He was referred to Dr. Aleskerov.  CT of  the chest showed focal wall thickening and luminal narrowing of the basilar segmental trunk of the right lower lobe.  There was also evidence of scarring and atelectasis.   Dr. Aleskerov for bronchoscopy.  There was extrinsic narrowing of the right lower lobe bronchus suspicious of takeoff of the superior segmental bronchus.  Biopsy showed no evidence of malignancy.  There was fibrosis, hemosiderin, and multinucleated giant cells.   I saw him in March. 78  He was doing well from a respiratory standpoint 78 and did not want to proceed with surgery.  He was having a lot of hip pain.  He subsequently had a left hip replacement.   This summer he had a lot of difficulty 78 with coughing and mucus production.  Recently had a CT and now returns to discuss possible surgical resection.  The patient was evaluated by Dr. Kerrin who recommended Bronchoscopy with right lower lobectomy.  The risks and benefits of the procedure were explained to the patient and he was agreeable to proceed.  Hospital Course:  Todd Farley presented to [facility] on 12/11/2023.  He was taken to the operating room and underwent Video Bronchoscopy with cultures and Robotic Assisted Right Video Thoracoscopy with Right Lower lobectomy, intercostal nerve block, lymph node sampling.  He tolerated the procedure without difficulty, was extubated, and taken to the PACU in stable condition.  On post-op day 1, there was no air leak from the chest tube and both lungs were well expanded on CXR. The chest tube was removed and follow up CXR was stable. Dr. Louder was weaned from the supplemental oxygen and  regained independence with mobility  without any trouble. Diet was advanced and well tolerated. There was a slight pump in the serum creatinine on post-op day 1 so the Toradol  was discontinued. The creatinine had normalized buy the following day. Pain control was adequate on oral tramadol . He was ready for discharge to home on post-op day 2. 78    Consults: None  Significant Diagnostic Studies: radiology:   CT scan:    FINDINGS: Cardiovascular: Heart is nonenlarged. No pericardial effusion. Coronary artery calcifications are seen. The thoracic aorta is normal course and caliber with slight atherosclerotic plaque.   Mediastinum/Nodes: Preserved thyroid gland. Esophagus has a normal course and caliber in the thorax. No discrete abnormal lymph node enlargement identified in the axillary region, hilum or mediastinum.   Lungs/Pleura: Once again there is some bandlike opacity in the right lower lobe. New from previous in the right lower lobe are areas opacity along the course of the bronchi please see coronal series 601, image 86, axial series 302, image 91. This could be mucous plugging. However this is in the area of stricture seen on prior examination from April 2025 and could be contributory. There is a differential. Otherwise no consolidation, pneumothorax or effusion.   Upper Abdomen: Fatty liver infiltration identified. Adrenal glands are preserved. Slight elevation of the right hemidiaphragm.   Musculoskeletal: Mild degenerative changes.   IMPRESSION: Increasing bronchial opacity in the area of previous potential structure in the right lower lobe anteriorly with increasing bandlike opacity in the right lower lobe with volume loss favoring atelectasis. With the opacity in the previous stricture aggressive process is not excluded. There is some bronchial thickening this location as well. Please correlate for any known history. Further workup versus short follow-up.   No pathologic enlarged lymph nodes in the thorax. Fatty liver infiltration.   Aortic Atherosclerosis (ICD10-I70.0).     Electronically Signed   By: Todd Farley M.D.   On: 10/26/2023 13:28    Treatments: Surgery  DATE OF PROCEDURE: 12/11/2023   PREOPERATIVE DIAGNOSIS: Bronchial stricture right lower lobe with recurrent pneumonias.    POSTOPERATIVE DIAGNOSIS: Bronchial stricture right lower lobe with recurrent pneumonias.   PROCEDURE: Flexible fiberoptic bronchoscopy, Xi robotic-assisted right lower lobectomy, intercostal nerve blocks levels 3 through 10.   SURGEON: Elspeth BROCKS. Kerrin, MD   ASSISTANT: Rocky Shad, PA.   Experienced assistance was necessary for this case due to surgical complexity. Erin Barrett assisted with port placement, camera management, robot docking and undocking, instrument exchange, specimen retrieval, suctioning, and wound closure.   ANESTHESIA: General.   FINDINGS: Bronchoscopy, there were extensive secretions, extrinsic narrowing of the right lower lobe bronchus, necrotic material in airway, but no tumor seen. Intraoperative right lower lobe failed to deflate. Otherwise, normal anatomy.  PATHOLOGY:  Pending  Discharge Exam: Blood pressure 127/67, pulse 70, temperature 97.8 F (36.6 C), temperature source Oral, resp. rate 18, height 6' 1 (1.854 m), weight 95.9 kg, SpO2 95%.  General appearance: alert, cooperative, and no distress Neurologic: intact Heart: regular rate and rhythm Lungs: diminished breath sounds right base Wound: clean and intact  Disposition: Discharged to home in stable condition.   Allergies as of 12/13/2023       Reactions   Other Shortness Of Breath   Siemese cats   Symbicort [budesonide-formoterol Fumarate] Shortness Of Breath   Oxycodone  Nausea And Vomiting        Medication List     STOP taking these medications    enoxaparin  40 MG/0.4ML injection Commonly known as: LOVENOX   TAKE these medications    acetaminophen  500 MG tablet Commonly known as: TYLENOL  Take 500 mg by mouth in the morning.   ADULT ONE DAILY GUMMIES PO Take 2 capsules by mouth in the morning.   albuterol  108 (90 Base) MCG/ACT inhaler Commonly known as: VENTOLIN  HFA Inhale 2 puffs into the lungs in the morning.   amoxicillin 500 MG capsule Commonly known  as: AMOXIL Take 2,000 mg by mouth See admin instructions. Take 4 capsules (2000 mg) by mouth 1 hour prior to dental work   aspirin  EC 81 MG tablet Take 81 mg by mouth daily at 12 noon. Swallow whole.   [employer] Severe Congestion Relief 10-650-400 MG/20ML Liqd Generic drug: Phenylephrine -APAP-guaiFENesin Take 20 mLs by mouth in the morning.   fluocinonide gel 0.05 % Commonly known as: LIDEX Apply 1 application  topically daily as needed (dry lips/mouth).   fluorouracil  5 % cream Commonly known as: EFUDEX  Apply to scalp twice a day x 10 days   folic acid  400 MCG tablet Commonly known as: FOLVITE  Take 800 mcg by mouth daily at 12 noon.   gabapentin  300 MG capsule Commonly known as: Neurontin  Take 1 capsule (300 mg total) by mouth 2 (two) times daily.   glipiZIDE  5 MG tablet Commonly known as: GLUCOTROL  Take 5 mg by mouth every evening.   ibuprofen 200 MG tablet Commonly known as: ADVIL Take 200 mg by mouth in the morning.   IRON PO Take 1 tablet by mouth daily at 12 noon.   loratadine  10 MG tablet Commonly known as: CLARITIN  Take 10 mg by mouth daily.   losartan  50 MG tablet Commonly known as: COZAAR  Take 50 mg by mouth daily.   metFORMIN  1000 MG tablet Commonly known as: GLUCOPHAGE  Take 2,000 mg by mouth daily.   omeprazole  40 MG capsule Commonly known as: PRILOSEC Take 40 mg by mouth in the morning.   PRESCRIPTION MEDICATION Apply 1 Application topically daily. Azelaic acid, Ivermectin, metronidazole compounded cream   simvastatin  20 MG tablet Commonly known as: ZOCOR  Take 20 mg by mouth daily.   traMADol  50 MG tablet Commonly known as: ULTRAM  Take 1 tablet (50 mg total) by mouth every 6 (six) hours as needed (mild pain).   triamcinolone ointment 0.1 % Commonly known as: KENALOG Apply 1 Application topically 3 (three) times daily as needed (psoriasis flares).   Vitamin B-12 6000 MCG Subl Take 6,000 mcg by mouth every evening.        Follow-up  Information     Todd Farley Elspeth BROCKS, MD Follow up on 12/22/2023.   Specialty: Cardiothoracic Surgery Why: Appointment is at 9:30, please get CXR at 8:30 on 2nd floor of our office building prior to your appointment Contact information: [address] [place] [address] [phone]                 Signed: Laurel JUDITHANN Becket, PA-C  12/13/2023, 10:14 AM

## 2023-12-11 NOTE — Discharge Instructions (Signed)
 Discharge Instructions:  1. You may shower, please wash incisions daily with soap and water and keep dry.  If you wish to cover wounds with dressing you may do so but please keep clean and change daily.  No tub baths or swimming until incisions have completely healed.  If your incisions become red or develop any drainage please call our office at [phone]  2. No Driving until cleared by Dr. Chrystal office and you are no longer using narcotic pain medications  3. Fever of 101.5 for at least 24 hours with no source, please contact our office at [phone]  4. Activity- up as tolerated, please walk at least 3 times per day.  Avoid strenuous activity  5. If any questions or concerns arise, please do not hesitate to contact our office at [phone]

## 2023-12-12 ENCOUNTER — Inpatient Hospital Stay (HOSPITAL_COMMUNITY)

## 2023-12-12 LAB — BASIC METABOLIC PANEL WITH GFR
Anion gap: 8 (ref 5–15)
BUN: 27 mg/dL — ABNORMAL HIGH (ref 8–23)
CO2: 24 mmol/L (ref 22–32)
Calcium: 8.2 mg/dL — ABNORMAL LOW (ref 8.9–10.3)
Chloride: 102 mmol/L (ref 98–111)
Creatinine, Ser: 1.32 mg/dL — ABNORMAL HIGH (ref 0.61–1.24)
GFR, Estimated: 55 mL/min — ABNORMAL LOW (ref >60)
Glucose, Bld: 168 mg/dL — ABNORMAL HIGH (ref 70–99)
Potassium: 4.2 mmol/L (ref 3.5–5.1)
Sodium: 134 mmol/L — ABNORMAL LOW (ref 135–145)

## 2023-12-12 LAB — GLUCOSE, CAPILLARY
Glucose-Capillary: 130 mg/dL — ABNORMAL HIGH (ref 70–99)
Glucose-Capillary: 131 mg/dL — ABNORMAL HIGH (ref 70–99)
Glucose-Capillary: 159 mg/dL — ABNORMAL HIGH (ref 70–99)
Glucose-Capillary: 161 mg/dL — ABNORMAL HIGH (ref 70–99)
Glucose-Capillary: 174 mg/dL — ABNORMAL HIGH (ref 70–99)
Glucose-Capillary: 216 mg/dL — ABNORMAL HIGH (ref 70–99)

## 2023-12-12 LAB — CBC
HCT: 34 % — ABNORMAL LOW (ref 39.0–52.0)
Hemoglobin: 11 g/dL — ABNORMAL LOW (ref 13.0–17.0)
MCH: 29.9 pg (ref 26.0–34.0)
MCHC: 32.4 g/dL (ref 30.0–36.0)
MCV: 92.4 fL (ref 80.0–100.0)
Platelets: 240 K/uL (ref 150–400)
RBC: 3.68 MIL/uL — ABNORMAL LOW (ref 4.22–5.81)
RDW: 14.6 % (ref 11.5–15.5)
WBC: 10.3 K/uL (ref 4.0–10.5)
nRBC: 0 % (ref 0.0–0.2)

## 2023-12-12 MED ORDER — INSULIN ASPART 100 UNIT/ML IJ SOLN
0.0000 [IU] | Freq: Three times a day (TID) | INTRAMUSCULAR | Status: DC
  Administered 2023-12-12: 2 [IU] via SUBCUTANEOUS
  Administered 2023-12-12 – 2023-12-13 (×2): 3 [IU] via SUBCUTANEOUS

## 2023-12-12 MED ORDER — OMEPRAZOLE 20 MG PO CPDR
20.0000 mg | DELAYED_RELEASE_CAPSULE | Freq: Every day | ORAL | Status: DC
  Administered 2023-12-12 – 2023-12-13 (×2): 20 mg via ORAL
  Filled 2023-12-12 (×2): qty 1

## 2023-12-12 MED ORDER — INSULIN ASPART 100 UNIT/ML IJ SOLN: 0.0000 [IU] | Freq: Every day | INTRAMUSCULAR | Status: DC

## 2023-12-12 MED ORDER — PANTOPRAZOLE SODIUM 40 MG PO TBEC: 40.0000 mg | DELAYED_RELEASE_TABLET | Freq: Every day | ORAL | Status: DC

## 2023-12-12 MED ORDER — GLIPIZIDE 5 MG PO TABS
5.0000 mg | ORAL_TABLET | Freq: Every evening | ORAL | Status: DC
  Administered 2023-12-12: 5 mg via ORAL
  Filled 2023-12-12 (×2): qty 1

## 2023-12-12 MED ORDER — NON FORMULARY: 20.0000 mg | Freq: Every day | Status: DC

## 2023-12-12 NOTE — Plan of Care (Signed)
  Problem: Education: Goal: Knowledge of General Education information will improve Description: Including pain rating scale, medication(s)/side effects and non-pharmacologic comfort measures Outcome: Progressing   Problem: Health Behavior/Discharge Planning: Goal: Ability to manage health-related needs will improve Outcome: Progressing   Problem: Clinical Measurements: Goal: Ability to maintain clinical measurements within normal limits will improve Outcome: Progressing Goal: Will remain free from infection Outcome: Progressing Goal: Diagnostic test results will improve Outcome: Progressing Goal: Respiratory complications will improve Outcome: Progressing Goal: Cardiovascular complication will be avoided Outcome: Progressing   Problem: Activity: Goal: Risk for activity intolerance will decrease Outcome: Progressing   Problem: Nutrition: Goal: Adequate nutrition will be maintained Outcome: Progressing   Problem: Coping: Goal: Level of anxiety will decrease Outcome: Progressing   Problem: Elimination: Goal: Will not experience complications related to bowel motility Outcome: Progressing Goal: Will not experience complications related to urinary retention Outcome: Progressing   Problem: Pain Managment: Goal: General experience of comfort will improve and/or be controlled Outcome: Progressing   Problem: Safety: Goal: Ability to remain free from injury will improve Outcome: Progressing   Problem: Skin Integrity: Goal: Risk for impaired skin integrity will decrease Outcome: Progressing   Problem: Education: Goal: Knowledge of disease or condition will improve Outcome: Progressing Goal: Knowledge of the prescribed therapeutic regimen will improve Outcome: Progressing   Problem: Activity: Goal: Risk for activity intolerance will decrease Outcome: Progressing   Problem: Cardiac: Goal: Will achieve and/or maintain hemodynamic stability Outcome: Progressing    Problem: Clinical Measurements: Goal: Postoperative complications will be avoided or minimized Outcome: Progressing   Problem: Respiratory: Goal: Respiratory status will improve Outcome: Progressing   Problem: Pain Management: Goal: Pain level will decrease Outcome: Progressing   Problem: Education: Goal: Ability to describe self-care measures that may prevent or decrease complications (Diabetes Survival Skills Education) will improve Outcome: Progressing Goal: Individualized Educational Video(s) Outcome: Progressing   Problem: Coping: Goal: Ability to adjust to condition or change in health will improve Outcome: Progressing

## 2023-12-12 NOTE — Progress Notes (Signed)
 1 Day Post-Op Procedure(s) (LRB): BRONCHOSCOPY, VIDEO-ASSISTED (N/A) RIGHT LOBECTOMY, LUNG, ROBOT-ASSISTED, USING VATS (Right) BLOCK, NERVE, RIGHT INTERCOSTAL (Right) Subjective: Some pain, but controlled  Objective: Vital signs in last 24 hours: Temp:  [97.9 F (36.6 C)-98.9 F (37.2 C)] 97.9 F (36.6 C) (09/13 0753) Pulse Rate:  [59-92] 68 (09/13 0753) Cardiac Rhythm: Normal sinus rhythm (09/13 0700) Resp:  [10-20] 20 (09/13 0753) BP: (98-139)/(52-98) 99/60 (09/13 0753) SpO2:  [93 %-100 %] 94 % (09/13 0753) Arterial Line BP: (133-148)/(65-74) 148/68 (09/12 1200) Weight:  [95.9 kg-98.4 kg] 95.9 kg (09/13 0446)  Hemodynamic parameters for last 24 hours:    Intake/Output from previous day: 09/12 0701 - 09/13 0700 In: 1438.6 [I.V.:1238.5; IV Piggyback:200.1] Out: 663 [Urine:375; Blood:50; Chest Tube:238] Intake/Output this shift: Total I/O In: 120 [P.O.:120] Out: 80 [Chest Tube:80]  General appearance: alert, cooperative, and no distress Neurologic: intact Heart: regular rate and rhythm Lungs: diminished breath sounds right base Abdomen: normal findings: soft, non-tender No air leak  Lab Results: Recent Labs    12/09/23 1230 12/12/23 0206  WBC 6.4 10.3  HGB 13.2 11.0*  HCT 42.3 34.0*  PLT 282 240   BMET:  Recent Labs    12/09/23 1230 12/12/23 0206  NA 138 134*  K 4.7 4.2  CL 103 102  CO2 30 24  GLUCOSE 127* 168*  BUN 21 27*  CREATININE 0.92 1.32*  CALCIUM 9.8 8.2*    PT/INR:  Recent Labs    12/09/23 1230  LABPROT 13.9  INR 1.0   ABG No results found for: PHART, HCO3, TCO2, ACIDBASEDEF, O2SAT CBG (last 3)  Recent Labs    12/12/23 0009 12/12/23 0428 12/12/23 0814  GLUCAP 216* 130* 159*    Assessment/Plan: S/P Procedure(s) (LRB): BRONCHOSCOPY, VIDEO-ASSISTED (N/A) RIGHT LOBECTOMY, LUNG, ROBOT-ASSISTED, USING VATS (Right) BLOCK, NERVE, RIGHT INTERCOSTAL (Right) POD # 1 right lower lobectomy Looks good No air leak, < 400 ml  drainage- dc chest tube Creatinine up at 1.3- stop ketorolac   Continue IVF  Monitor CBG elevated  Resume Glucotrol   Continue SSI IS SCD + enoxaparin  Ambulate    LOS: 1 day    Todd Farley 12/12/2023

## 2023-12-13 ENCOUNTER — Inpatient Hospital Stay (HOSPITAL_COMMUNITY)

## 2023-12-13 DIAGNOSIS — Z902 Acquired absence of lung [part of]: Secondary | ICD-10-CM

## 2023-12-13 LAB — CBC
HCT: 35 % — ABNORMAL LOW (ref 39.0–52.0)
Hemoglobin: 11.1 g/dL — ABNORMAL LOW (ref 13.0–17.0)
MCH: 29.4 pg (ref 26.0–34.0)
MCHC: 31.7 g/dL (ref 30.0–36.0)
MCV: 92.8 fL (ref 80.0–100.0)
Platelets: 235 K/uL (ref 150–400)
RBC: 3.77 MIL/uL — ABNORMAL LOW (ref 4.22–5.81)
RDW: 14.7 % (ref 11.5–15.5)
WBC: 8.7 K/uL (ref 4.0–10.5)
nRBC: 0 % (ref 0.0–0.2)

## 2023-12-13 LAB — COMPREHENSIVE METABOLIC PANEL WITH GFR
ALT: 27 U/L (ref 0–44)
AST: 38 U/L (ref 15–41)
Albumin: 2.7 g/dL — ABNORMAL LOW (ref 3.5–5.0)
Alkaline Phosphatase: 55 U/L (ref 38–126)
Anion gap: 8 (ref 5–15)
BUN: 21 mg/dL (ref 8–23)
CO2: 24 mmol/L (ref 22–32)
Calcium: 8.2 mg/dL — ABNORMAL LOW (ref 8.9–10.3)
Chloride: 104 mmol/L (ref 98–111)
Creatinine, Ser: 0.96 mg/dL (ref 0.61–1.24)
GFR, Estimated: >60 mL/min (ref >60)
Glucose, Bld: 163 mg/dL — ABNORMAL HIGH (ref 70–99)
Potassium: 4.2 mmol/L (ref 3.5–5.1)
Sodium: 136 mmol/L (ref 135–145)
Total Bilirubin: 0.6 mg/dL (ref 0.0–1.2)
Total Protein: 5.4 g/dL — ABNORMAL LOW (ref 6.5–8.1)

## 2023-12-13 LAB — GLUCOSE, CAPILLARY
Glucose-Capillary: 164 mg/dL — ABNORMAL HIGH (ref 70–99)
Glucose-Capillary: 243 mg/dL — ABNORMAL HIGH (ref 70–99)

## 2023-12-13 LAB — ACID FAST SMEAR (AFB, MYCOBACTERIA)
Acid Fast Smear: NEGATIVE
Acid Fast Smear: NEGATIVE

## 2023-12-13 MED ORDER — GABAPENTIN 300 MG PO CAPS: 300.0000 mg | ORAL_CAPSULE | Freq: Two times a day (BID) | ORAL | 1 refills | Status: AC

## 2023-12-13 MED ORDER — METFORMIN HCL 500 MG PO TABS
2000.0000 mg | ORAL_TABLET | Freq: Every day | ORAL | Status: DC
  Administered 2023-12-13: 2000 mg via ORAL
  Filled 2023-12-13: qty 4

## 2023-12-13 MED ORDER — TRAMADOL HCL 50 MG PO TABS: 50.0000 mg | ORAL_TABLET | Freq: Four times a day (QID) | ORAL | 0 refills | Status: AC | PRN

## 2023-12-13 NOTE — Progress Notes (Signed)
 2 Days Post-Op Procedure(s) (LRB): BRONCHOSCOPY, VIDEO-ASSISTED (N/A) RIGHT LOBECTOMY, LUNG, ROBOT-ASSISTED, USING VATS (Right) BLOCK, NERVE, RIGHT INTERCOSTAL (Right) Subjective: Some incisional discomfort controlled with tramadol   Objective: Vital signs in last 24 hours: Temp:  [97.8 F (36.6 C)-99.5 F (37.5 C)] 97.8 F (36.6 C) (09/14 0706) Pulse Rate:  [67-74] 70 (09/14 0706) Cardiac Rhythm: Normal sinus rhythm (09/14 0700) Resp:  [15-20] 18 (09/14 0706) BP: (120-128)/(62-75) 127/67 (09/14 0706) SpO2:  [93 %-98 %] 95 % (09/14 0706)  Hemodynamic parameters for last 24 hours:    Intake/Output from previous day: 09/13 0701 - 09/14 0700 In: 921.6 [P.O.:240; I.V.:681.6] Out: 80 [Chest Tube:80] Intake/Output this shift: No intake/output data recorded.  General appearance: alert, cooperative, and no distress Neurologic: intact Heart: regular rate and rhythm Lungs: diminished breath sounds right base Wound: clean and intact  Lab Results: Recent Labs    12/12/23 0206 12/13/23 0202  WBC 10.3 8.7  HGB 11.0* 11.1*  HCT 34.0* 35.0*  PLT 240 235   BMET:  Recent Labs    12/12/23 0206 12/13/23 0202  NA 134* 136  K 4.2 4.2  CL 102 104  CO2 24 24  GLUCOSE 168* 163*  BUN 27* 21  CREATININE 1.32* 0.96  CALCIUM 8.2* 8.2*    PT/INR: No results for input(s): LABPROT, INR in the last 72 hours. ABG No results found for: PHART, HCO3, TCO2, ACIDBASEDEF, O2SAT CBG (last 3)  Recent Labs    12/12/23 1628 12/12/23 2124 12/13/23 0551  GLUCAP 131* 174* 164*    Assessment/Plan: S/P Procedure(s) (LRB): BRONCHOSCOPY, VIDEO-ASSISTED (N/A) RIGHT LOBECTOMY, LUNG, ROBOT-ASSISTED, USING VATS (Right) BLOCK, NERVE, RIGHT INTERCOSTAL (Right) POD # 2Doing well CXR OK Path pending Creatinine normal Resume metformin  Home later today   LOS: 2 days    Todd Farley 12/13/2023

## 2023-12-13 NOTE — Progress Notes (Signed)
 Went over discharge paperwork and answered all questions. PIV and telemetry removed. All belongings at bedside.

## 2023-12-14 ENCOUNTER — Encounter (HOSPITAL_COMMUNITY): Payer: Self-pay | Admitting: Thoracic Surgery (Cardiothoracic Vascular Surgery)

## 2023-12-14 ENCOUNTER — Ambulatory Visit: Admitting: Physical Therapy

## 2023-12-14 NOTE — Anesthesia Postprocedure Evaluation (Signed)
 Anesthesia Post Note  Patient: Todd Farley  Procedure(s) Performed: BRONCHOSCOPY, VIDEO-ASSISTED (Chest) RIGHT LOBECTOMY, LUNG, ROBOT-ASSISTED, USING VATS (Right: Chest) BLOCK, NERVE, RIGHT INTERCOSTAL (Right: Chest)     Patient location during evaluation: PACU Anesthesia Type: General Level of consciousness: sedated and patient cooperative Pain management: pain level controlled Vital Signs Assessment: post-procedure vital signs reviewed and stable Respiratory status: spontaneous breathing Cardiovascular status: stable Anesthetic complications: no   There were no known notable events for this encounter.  Last Vitals:  Vitals:   12/13/23 0706 12/13/23 1107  BP: 127/67 124/80  Pulse: 70 62  Resp: 18 16  Temp: 36.6 C 37.1 C  SpO2: 95% 96%    Last Pain:  Vitals:   12/13/23 1202  TempSrc:   PainSc: 2                  Norleen Pope

## 2023-12-15 LAB — POCT I-STAT 7, (LYTES, BLD GAS, ICA,H+H)
Acid-Base Excess: 1 mmol/L (ref 0.0–2.0)
Bicarbonate: 27.1 mmol/L (ref 20.0–28.0)
Calcium, Ion: 1.23 mmol/L (ref 1.15–1.40)
HCT: 38 % — ABNORMAL LOW (ref 39.0–52.0)
Hemoglobin: 12.9 g/dL — ABNORMAL LOW (ref 13.0–17.0)
O2 Saturation: 100 %
Potassium: 4 mmol/L (ref 3.5–5.1)
Sodium: 138 mmol/L (ref 135–145)
TCO2: 29 mmol/L (ref 22–32)
pCO2 arterial: 48.6 mmHg — ABNORMAL HIGH (ref 32–48)
pH, Arterial: 7.354 (ref 7.35–7.45)
pO2, Arterial: 413 mmHg — ABNORMAL HIGH (ref 83–108)

## 2023-12-16 ENCOUNTER — Ambulatory Visit: Admitting: Physical Therapy

## 2023-12-16 LAB — AEROBIC/ANAEROBIC CULTURE W GRAM STAIN (SURGICAL/DEEP WOUND)
Culture: NO GROWTH
Culture: NO GROWTH
Gram Stain: NONE SEEN

## 2023-12-17 LAB — SURGICAL PATHOLOGY

## 2023-12-21 ENCOUNTER — Other Ambulatory Visit: Payer: Self-pay | Admitting: Thoracic Surgery (Cardiothoracic Vascular Surgery)

## 2023-12-21 DIAGNOSIS — R911 Solitary pulmonary nodule: Secondary | ICD-10-CM

## 2023-12-22 ENCOUNTER — Ambulatory Visit (HOSPITAL_COMMUNITY)
Admission: RE | Admit: 2023-12-22 | Discharge: 2023-12-22 | Disposition: A | Discharge status: Home / Self Care | Source: Ambulatory Visit | Attending: Internal Medicine | Admitting: Internal Medicine

## 2023-12-22 ENCOUNTER — Ambulatory Visit
Payer: Self-pay | Attending: Thoracic Surgery (Cardiothoracic Vascular Surgery) | Admitting: Thoracic Surgery (Cardiothoracic Vascular Surgery)

## 2023-12-22 VITALS — BP 120/67 | HR 82 | Resp 20 | Ht 73.0 in | Wt 222.0 lb

## 2023-12-22 DIAGNOSIS — R911 Solitary pulmonary nodule: Secondary | ICD-10-CM | POA: Diagnosis present

## 2023-12-22 DIAGNOSIS — Z9889 Other specified postprocedural states: Secondary | ICD-10-CM

## 2023-12-22 MED ORDER — FUROSEMIDE 40 MG PO TABS: 40.0000 mg | ORAL_TABLET | Freq: Every day | ORAL | 0 refills | Status: DC

## 2023-12-22 MED ORDER — POTASSIUM CHLORIDE CRYS ER 10 MEQ PO TBCR: 20.0000 meq | EXTENDED_RELEASE_TABLET | Freq: Every day | ORAL | 0 refills | Status: DC

## 2023-12-22 NOTE — Progress Notes (Signed)
 [address], [place] [address]             [phone]   HPI: Dr. 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  He has had recurrent infections over the past 15 years.  He had a CT of the chest which showed focal wall thickening and luminal narrowing of the basilar segmental trunk of the right lower lobe along with scarring and atelectasis.  Dr. Parris did a bronchoscopy which showed narrowing of the right lower lobe bronchus with no endobronchial mass lesion.  Biopsy showed fibrosis, hemosiderin, and multinucleated giant cells.  I did a robotic assisted right lower lobectomy on 12/11/2023.  He went home on postoperative day #2.  He has some incisional pain.  Says a different kind of pain that he has had with previous surgeries.  Has had some cough and mucus production but both are improved compared to preoperatively.  No fevers or chills.  Has chronic swelling in his legs which has worsened a little bit since his surgery.  No calf pain or redness or tenderness.  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 14 tablet 0   potassium chloride  (KLOR-CON  M) 10 MEQ tablet Take 2 tablets (20 mEq total) by mouth daily. 14 tablet 0   acetaminophen  (TYLENOL ) 500 MG tablet Take 500 mg by mouth in the morning.     albuterol   (VENTOLIN  HFA) 108 (90 Base) MCG/ACT inhaler Inhale 2 puffs into the lungs in the morning.     amoxicillin (AMOXIL) 500 MG capsule Take 2,000 mg by mouth See admin instructions. Take 4 capsules (2000 mg) by mouth 1 hour prior to dental work     aspirin  EC 81 MG tablet Take 81 mg by mouth daily at 12 noon. Swallow whole.     Cyanocobalamin (VITAMIN B-12) 6000 MCG SUBL Take 6,000 mcg by mouth every evening.     Ferrous Sulfate (IRON PO) Take 1 tablet by mouth daily at 12 noon.     fluocinonide gel (LIDEX) 0.05 % Apply 1 application  topically daily as needed (dry lips/mouth).     fluorouracil  (EFUDEX ) 5 % cream Apply to scalp twice a day x 10 days (Patient not taking: Reported on 12/22/2023) 30 g 2   folic acid  (FOLVITE ) 400 MCG tablet Take 800 mcg by mouth daily at 12 noon.     gabapentin  (NEURONTIN ) 300 MG capsule Take 1 capsule (300 mg total) by mouth 2 (two) times daily. 60 capsule 1   glipiZIDE  (GLUCOTROL ) 5 MG tablet Take 5 mg by mouth every evening.     ibuprofen (ADVIL) 200 MG tablet Take 200 mg by mouth in the morning.     loratadine  (CLARITIN ) 10  MG tablet Take 10 mg by mouth daily.     losartan  (COZAAR ) 50 MG tablet Take 50 mg by mouth daily.     metFORMIN  (GLUCOPHAGE ) 1000 MG tablet Take 2,000 mg by mouth daily.     Multiple Vitamins-Minerals (ADULT ONE DAILY GUMMIES PO) Take 2 capsules by mouth in the morning.     omeprazole  (PRILOSEC) 40 MG capsule Take 40 mg by mouth in the morning.     Phenylephrine -APAP-guaiFENesin ([employer] SEVERE CONGESTION RELIEF) 10-650-400 MG/20ML LIQD Take 20 mLs by mouth in the morning.     PRESCRIPTION MEDICATION Apply 1 Application topically daily. Azelaic acid, Ivermectin, metronidazole compounded cream     simvastatin  (ZOCOR ) 20 MG tablet Take 20 mg by mouth daily.     traMADol  (ULTRAM ) 50 MG tablet Take 1 tablet (50 mg total) by mouth every 6 (six) hours as needed (mild pain). 28 tablet 0   triamcinolone ointment (KENALOG) 0.1 % Apply 1 Application topically  3 (three) times daily as needed (psoriasis flares).     No current facility-administered medications for this visit.    Physical Exam BP 120/67   Pulse 82   Resp 20   Ht 6' 1 (1.854 m)   Wt 222 lb (100.7 kg)   SpO2 95%   BMI 29.19 kg/m  78 year old man in no acute distress Alert and oriented x 3 with no focal deficits Cardiac regular rate and rhythm Lungs diminished at right base, otherwise clear Incisions with minimal skin separation and redness for a millimeter or 2 along the incisions.  Diagnostic Tests: I personally reviewed his chest x-ray.  Shows postoperative changes with a pleural effusion that is larger than his previous film from 9 days ago..  A. BRONCHUS, BIOPSY: - Predominantly necrotic tissue with iron deposition. - Focal detached atypical squamous cells with acute inflammation (see comment).  Comment: In the presence of necrosis and acute inflammation, the findings may represent reactive changes.  Clinical correlation is recommended.  B. LYMPH NODE, LEVEL 7, EXCISION: - One lymph node, negative for carcinoma.  C. LYMPH NODE, LEVEL 7 #2, EXCISION: - One lymph node, negative for carcinoma.  D. LYMPH NODE, LEVEL 11, EXCISION: - One lymph node, negative for carcinoma.  E. LYMPH NODE, LEVEL 12, EXCISION: - One lymph node, negative for carcinoma.  F. LYMPH NODE, LEVEL 12 #2, EXCISION: - One lymph node, negative for carcinoma.  G. LYMPH NODE, LEVEL 13, EXCISION: - One lymph node, negative for carcinoma.  H. LYMPH NODE, LEVEL 12 #3, EXCISION: - One lymph node, negative for carcinoma.  I. LYMPH NODE, LEVEL 10R, EXCISION: - One lymph node, negative for carcinoma.  J. LUNG, RIGHT LOWER LOBE, LOBECTOMY: - Benign lung tissue with marked iron deposition and associated fibrosis, chronic inflammation, foreign body giant cell reaction, and tissue necrosis (see comment). - Emphysematous changes. - Eight lymph nodes, negative for carcinoma.  Comment: Iron  stain is performed and highlights the golden-brown pigment in the lung.  These results are consistent with the above diagnosis.   AFB, fungal, and bacterial stains negative.  Cultures negative to date.  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  He underwent right lower lobectomy.  Pathology showed chronic inflammation, giant cells, iron deposition and fibrosis.  No malignancy.  Overall he is doing well.  He is only about 10 days out from the surgery.  Is still taking tramadol  3-4 times a day although  he says his pain has improved over the past couple of days.  He is taking gabapentin .  He is not having any of his usual diabetic neuropathy pain likely due to the gabapentin .  Recommended he try taking a Tylenol  plus and Advil 3-4 times a day and continue to use the tramadol  as needed.  Continue gabapentin .  You may resume activities gradually as tolerated.  Think he tried to overdo it a little bit a couple days ago.  No evidence of wound infections but he does have slight skin separation at all the incisions.  It appears he may have had a reaction to the Vicryl sutures. Should heal without any significant issues.  If he has any problems with that he will let me know.  He does have a small to moderate right pleural effusion.  Also has 3+ pitting edema bilaterally.  Will prescribe Lasix  40 mg daily and potassium 20 mill equivalents daily for 2 weeks.  Plan: Lasix  and potassium Return in 4 weeks with PA lateral chest x-ray  Todd [name], MD [facility] [phone]

## 2023-12-23 ENCOUNTER — Encounter: Payer: Self-pay | Admitting: Thoracic Surgery (Cardiothoracic Vascular Surgery)

## 2023-12-23 ENCOUNTER — Other Ambulatory Visit: Payer: Self-pay | Admitting: *Deleted

## 2023-12-23 MED ORDER — POTASSIUM CHLORIDE CRYS ER 10 MEQ PO TBCR: 20.0000 meq | EXTENDED_RELEASE_TABLET | Freq: Every day | ORAL | 0 refills | Status: DC

## 2024-01-11 LAB — FUNGUS CULTURE RESULT

## 2024-01-11 LAB — FUNGUS CULTURE WITH STAIN

## 2024-01-11 LAB — FUNGAL ORGANISM REFLEX

## 2024-01-12 LAB — FUNGUS CULTURE WITH STAIN

## 2024-01-12 LAB — FUNGUS CULTURE RESULT

## 2024-01-12 LAB — FUNGAL ORGANISM REFLEX

## 2024-01-21 ENCOUNTER — Other Ambulatory Visit: Payer: Self-pay | Admitting: Thoracic Surgery (Cardiothoracic Vascular Surgery)

## 2024-01-21 DIAGNOSIS — R911 Solitary pulmonary nodule: Secondary | ICD-10-CM

## 2024-01-22 ENCOUNTER — Other Ambulatory Visit: Payer: Self-pay | Admitting: Thoracic Surgery (Cardiothoracic Vascular Surgery)

## 2024-01-22 DIAGNOSIS — R918 Other nonspecific abnormal finding of lung field: Secondary | ICD-10-CM

## 2024-01-25 LAB — ACID FAST CULTURE WITH REFLEXED SENSITIVITIES (MYCOBACTERIA)
Acid Fast Culture: NEGATIVE
Acid Fast Culture: NEGATIVE

## 2024-01-26 ENCOUNTER — Encounter: Payer: Self-pay | Admitting: Thoracic Surgery (Cardiothoracic Vascular Surgery)

## 2024-01-26 ENCOUNTER — Ambulatory Visit
Admission: RE | Admit: 2024-01-26 | Discharge: 2024-01-26 | Disposition: A | Discharge status: Home / Self Care | Payer: Self-pay | Source: Ambulatory Visit | Attending: Cardiology | Admitting: Cardiology

## 2024-01-26 ENCOUNTER — Ambulatory Visit: Payer: Self-pay | Admitting: Thoracic Surgery (Cardiothoracic Vascular Surgery)

## 2024-01-26 VITALS — BP 129/74 | HR 76 | Resp 20 | Ht 73.0 in | Wt 220.0 lb

## 2024-01-26 DIAGNOSIS — Z9889 Other specified postprocedural states: Secondary | ICD-10-CM

## 2024-01-26 DIAGNOSIS — R918 Other nonspecific abnormal finding of lung field: Secondary | ICD-10-CM | POA: Insufficient documentation

## 2024-01-26 DIAGNOSIS — R911 Solitary pulmonary nodule: Secondary | ICD-10-CM | POA: Diagnosis present

## 2024-01-26 NOTE — Progress Notes (Signed)
 [address] [place] [address]             [phone]    HPI: Todd Farley returns for follow-up after right lower lobectomy.  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  He had recurrent respiratory infections for about 15 years.  He had a CT of the chest which showed focal wall thickening and luminal narrowing of the basilar segmental trunk of the right lower lobe.  He had significant scarring and atelectasis.  Dr. Parris did bronchoscopy which showed extrinsic narrowing of the bronchus with no endobronchial lesion.  Biopsies showed fibrosis, hemosiderin, and multinucleated giant cells.  Because of his recurrent infections he was sent for lobectomy.  He underwent a robotic assisted right lower lobectomy on 12/11/2023.  He went home on postoperative day #2.  I saw him in the office on 12/22/2023.  He was still having a lot of incisional pain at that time.  In the interim since his last visit he is feeling better.  He still has some paresthesias along the right costal margin.  He is taking Tylenol  once a day.  Stopped taking gabapentin  as it was causing brain fog.  Has a relatively frequent cough.  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     albuterol  (VENTOLIN  HFA) 108 (90 Base) MCG/ACT inhaler Inhale 2 puffs into the lungs in the morning.     amoxicillin (AMOXIL)  500 MG capsule Take 2,000 mg by mouth See admin instructions. Take 4 capsules (2000 mg) by mouth 1 hour prior to dental work     aspirin  EC 81 MG tablet Take 81 mg by mouth daily at 12 noon. Swallow whole.     Cyanocobalamin (VITAMIN B-12) 6000 MCG SUBL Take 6,000 mcg by mouth every evening.     Ferrous Sulfate (IRON PO) Take 1 tablet by mouth daily at 12 noon.     folic acid  (FOLVITE ) 400 MCG tablet Take 800 mcg by mouth daily at 12 noon.     furosemide  (LASIX ) 40 MG tablet Take 1 tablet (40 mg total) by mouth daily. 14 tablet 0   gabapentin  (NEURONTIN ) 300 MG capsule Take 1 capsule (300 mg total) by mouth 2 (two) times daily. 60 capsule 1   glipiZIDE  (GLUCOTROL ) 5 MG tablet Take 5 mg by mouth every evening.     ibuprofen (ADVIL) 200 MG tablet Take 200 mg by mouth in the morning.     loratadine  (CLARITIN ) 10 MG tablet Take 10 mg by mouth daily.     losartan  (COZAAR ) 50 MG tablet Take 50 mg by mouth daily.     metFORMIN  (GLUCOPHAGE ) 1000 MG tablet Take 2,000 mg by mouth daily.     Multiple Vitamins-Minerals (ADULT ONE DAILY GUMMIES PO) Take  2 capsules by mouth in the morning.     omeprazole  (PRILOSEC) 40 MG capsule Take 40 mg by mouth in the morning.     Phenylephrine -APAP-guaiFENesin ([employer] SEVERE CONGESTION RELIEF) 10-650-400 MG/20ML LIQD Take 20 mLs by mouth in the morning.     potassium chloride  (KLOR-CON  M) 10 MEQ tablet Take 2 tablets (20 mEq total) by mouth daily. 14 tablet 0   PRESCRIPTION MEDICATION Apply 1 Application topically daily. Azelaic acid, Ivermectin, metronidazole compounded cream     simvastatin  (ZOCOR ) 20 MG tablet Take 20 mg by mouth daily.     traMADol  (ULTRAM ) 50 MG tablet Take 1 tablet (50 mg total) by mouth every 6 (six) hours as needed (mild pain). 28 tablet 0   triamcinolone ointment (KENALOG) 0.1 % Apply 1 Application topically 3 (three) times daily as needed (psoriasis flares).     fluocinonide gel (LIDEX) 0.05 % Apply 1 application  topically daily as needed (dry  lips/mouth). (Patient not taking: Reported on 01/26/2024)     fluorouracil  (EFUDEX ) 5 % cream Apply to scalp twice a day x 10 days (Patient not taking: Reported on 01/26/2024) 30 g 2   No current facility-administered medications for this visit.    Physical Exam BP 129/74   Pulse 76   Resp 20   Ht 6' 1 (1.854 m)   Wt 220 lb (99.8 kg)   SpO2 98% Comment: RA  BMI 29.85 kg/m  78 year old man in no acute distress Alert and oriented x 3 with no focal deficits Lungs diminished at right base, otherwise clear Incisions well-healed Trace edema both lower extremities  Diagnostic Tests: I personally reviewed his chest x-ray images.  Postoperative changes from right lower lobectomy.  He does have a small right pleural effusion filling portion of the space.  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   Status post right lower lobectomy-now about 6 weeks out from surgery.  Doing well at this point.  Only having to take Tylenol  once a day.  Does still have some pain which is normal at this point.  Hopefully will continue to resolve with time, but I did caution him that he may always have some unusual sensations over there.  Postoperative cough is improving as well.  No restrictions on his activities.  Plan: Return in 2 months with PA lateral chest x-ray  Todd Todd Millers, MD [facility] [phone]

## 2024-03-15 ENCOUNTER — Telehealth: Payer: Self-pay

## 2024-03-15 NOTE — Telephone Encounter (Signed)
 Patient called asking for refill of [employer] rosacea triple cream. Okay to send in?

## 2024-03-15 NOTE — Telephone Encounter (Signed)
Prescription sent in. aw

## 2024-03-25 NOTE — Telephone Encounter (Signed)
 Levaquin  sent

## 2024-03-29 ENCOUNTER — Ambulatory Visit: Admitting: Thoracic Surgery (Cardiothoracic Vascular Surgery)

## 2024-04-04 DIAGNOSIS — Z9889 Other specified postprocedural states: Secondary | ICD-10-CM

## 2024-04-05 ENCOUNTER — Ambulatory Visit
Attending: Thoracic Surgery (Cardiothoracic Vascular Surgery) | Admitting: Thoracic Surgery (Cardiothoracic Vascular Surgery)

## 2024-04-05 ENCOUNTER — Encounter: Payer: Self-pay | Admitting: Thoracic Surgery (Cardiothoracic Vascular Surgery)

## 2024-04-05 ENCOUNTER — Ambulatory Visit (HOSPITAL_COMMUNITY)
Admission: RE | Admit: 2024-04-05 | Discharge: 2024-04-05 | Disposition: A | Discharge status: Home / Self Care | Source: Ambulatory Visit | Attending: Cardiology | Admitting: Cardiology

## 2024-04-05 VITALS — BP 136/77 | HR 84 | Resp 20 | Ht 73.0 in | Wt 212.0 lb

## 2024-04-05 DIAGNOSIS — Z9889 Other specified postprocedural states: Secondary | ICD-10-CM | POA: Diagnosis present

## 2024-04-05 DIAGNOSIS — R918 Other nonspecific abnormal finding of lung field: Secondary | ICD-10-CM

## 2024-04-05 NOTE — Progress Notes (Signed)
 "  [address] [place] [address]             [phone]     HPI: Dr. [name] returns for scheduled follow-up after right lower lobectomy.  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  For about 15 years she had recurrent infections.  CT showed luminal narrowing of the right lower lobe bronchus.  Biopsies did not show any endobronchial lesions.  Todd Farley underwent a robotic right lower lobectomy on 12/11/2023.  Pathology did not show any malignancy.  I last saw him in the office on 01/26/2024.  Todd Farley was doing well at that time.  Still had some paresthesias.  Was not using any narcotics or gabapentin .  In the interim since his last visit Todd Farley unfortunately had the flu around Christmas.  Todd Farley did take Tamiflu and did not have a severe case but did notice some wheezing and productive cough.  Todd Farley was treated with empiric Levaquin in addition to the Tamiflu.  Currently feels well.  Denies any incisional pain.  Overall breathing feels slightly improved compared to prior.  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     albuterol  (VENTOLIN  HFA) 108 (90 Base) MCG/ACT inhaler Inhale 2 puffs into the lungs in the morning.     amoxicillin (AMOXIL) 500 MG capsule Take 2,000 mg by mouth See admin instructions. Take 4 capsules (2000 mg) by mouth 1 hour prior to dental work     aspirin  EC 81 MG  tablet Take 81 mg by mouth daily at 12 noon. Swallow whole.     Cyanocobalamin (VITAMIN B-12) 6000 MCG SUBL Take 6,000 mcg by mouth every evening.     Ferrous Sulfate (IRON PO) Take 1 tablet by mouth daily at 12 noon.     folic acid  (FOLVITE ) 400 MCG tablet Take 800 mcg by mouth daily at 12 noon.     gabapentin  (NEURONTIN ) 300 MG capsule Take 1 capsule (300 mg total) by mouth 2 (two) times daily. 60 capsule 1   glipiZIDE  (GLUCOTROL ) 5 MG tablet Take 5 mg by mouth every evening.     ibuprofen (ADVIL) 200 MG tablet Take 200 mg by mouth in the morning.     loratadine  (CLARITIN ) 10 MG tablet Take 10 mg by mouth daily.     losartan  (COZAAR ) 50 MG tablet Take 50 mg by mouth daily.     metFORMIN  (GLUCOPHAGE ) 1000 MG tablet Take 2,000 mg by mouth daily.     Multiple Vitamins-Minerals (ADULT ONE DAILY GUMMIES PO) Take 2 capsules by mouth in the morning.     omeprazole  (PRILOSEC) 40 MG capsule Take 40 mg by mouth in the morning.     Phenylephrine -APAP-guaiFENesin ([employer] SEVERE CONGESTION RELIEF) 10-650-400 MG/20ML LIQD Take 20 mLs by  mouth in the morning.     PRESCRIPTION MEDICATION Apply 1 Application topically daily. Azelaic acid, Ivermectin, metronidazole compounded cream     simvastatin  (ZOCOR ) 20 MG tablet Take 20 mg by mouth daily.     torsemide (DEMADEX) 20 MG tablet Take 20 mg by mouth every Monday, Wednesday, and Friday.     traMADol  (ULTRAM ) 50 MG tablet Take 1 tablet (50 mg total) by mouth every 6 (six) hours as needed (mild pain). 28 tablet 0   triamcinolone ointment (KENALOG) 0.1 % Apply 1 Application topically 3 (three) times daily as needed (psoriasis flares).     fluocinonide gel (LIDEX) 0.05 % Apply 1 application  topically daily as needed (dry lips/mouth). (Patient not taking: Reported on 04/05/2024)     fluorouracil  (EFUDEX ) 5 % cream Apply to scalp twice a day x 10 days (Patient not taking: Reported on 04/05/2024) 30 g 2   No current facility-administered medications for this visit.     Physical Exam BP 136/77   Pulse 84   Resp 20   Ht 6' 1 (1.854 m)   Wt 212 lb (96.2 kg)   SpO2 94% Comment: RA  BMI 27.81 kg/m  79 year old man in no acute distress Alert and oriented x 3 with no focal deficits Lungs faint wheeze at right base but otherwise clear Cardiac regular rate and rhythm  Diagnostic Tests: Chest x-ray shows post lobectomy changes with improved aeration at the right base.  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  Todd Farley underwent robotic assisted right lower lobectomy on on 12/11/2023.  Currently doing well.  Not having any pain issues.  There were no restrictions on his activities.   Plan: Todd Farley will continue to follow-up with Dr. Aleskerov Todd Farley knows to call if Todd Farley has any issues related to his surgery I will be happy to see Dr. Louder back anytime in the future if I can be of any further assistance with his care  Todd [name], MD [facility] [phone]     "

## 2024-05-11 ENCOUNTER — Ambulatory Visit

## 2024-05-13 ENCOUNTER — Ambulatory Visit

## 2024-05-16 ENCOUNTER — Ambulatory Visit

## 2024-05-18 ENCOUNTER — Ambulatory Visit

## 2024-05-30 ENCOUNTER — Ambulatory Visit

## 2024-06-01 ENCOUNTER — Ambulatory Visit

## 2024-06-07 ENCOUNTER — Ambulatory Visit

## 2024-06-14 ENCOUNTER — Ambulatory Visit

## 2024-06-16 ENCOUNTER — Ambulatory Visit

## 2024-06-21 ENCOUNTER — Ambulatory Visit

## 2024-06-23 ENCOUNTER — Ambulatory Visit

## 2024-06-28 ENCOUNTER — Ambulatory Visit

## 2024-06-30 ENCOUNTER — Ambulatory Visit

## 2024-09-27 ENCOUNTER — Ambulatory Visit: Admitting: Dermatology
